# Patient Record
Sex: Male | Born: 1955 | Race: Black or African American | Hispanic: No | Marital: Single | Smoking: Current every day smoker
Health system: Southern US, Community
[De-identification: ages and names within clinical notes are randomized; demographics above are authoritative.]

## PROBLEM LIST (undated history)

## (undated) DIAGNOSIS — I10 Essential (primary) hypertension: Secondary | ICD-10-CM

## (undated) DIAGNOSIS — K279 Peptic ulcer, site unspecified, unspecified as acute or chronic, without hemorrhage or perforation: Secondary | ICD-10-CM

## (undated) DIAGNOSIS — E119 Type 2 diabetes mellitus without complications: Secondary | ICD-10-CM

## (undated) HISTORY — DX: Type 2 diabetes mellitus without complications: E11.9

## (undated) HISTORY — PX: THYROIDECTOMY: SHX17

---

## 2003-01-11 ENCOUNTER — Other Ambulatory Visit: Payer: Self-pay

## 2003-12-11 ENCOUNTER — Emergency Department: Payer: Self-pay | Admitting: Emergency Medicine

## 2004-06-03 ENCOUNTER — Ambulatory Visit: Payer: Self-pay | Admitting: Specialist

## 2004-07-18 ENCOUNTER — Ambulatory Visit: Payer: Self-pay | Admitting: Specialist

## 2007-07-11 ENCOUNTER — Emergency Department: Payer: Self-pay | Admitting: Internal Medicine

## 2009-01-25 ENCOUNTER — Emergency Department: Payer: Self-pay | Admitting: Emergency Medicine

## 2009-02-03 ENCOUNTER — Emergency Department: Payer: Self-pay | Admitting: Emergency Medicine

## 2009-02-06 ENCOUNTER — Emergency Department: Payer: Self-pay | Admitting: Emergency Medicine

## 2012-01-29 DIAGNOSIS — I10 Essential (primary) hypertension: Secondary | ICD-10-CM | POA: Insufficient documentation

## 2014-03-23 ENCOUNTER — Emergency Department: Payer: Self-pay | Admitting: Emergency Medicine

## 2014-05-20 ENCOUNTER — Emergency Department: Payer: Self-pay | Admitting: Emergency Medicine

## 2014-11-29 ENCOUNTER — Emergency Department: Payer: Self-pay

## 2014-11-29 ENCOUNTER — Emergency Department
Admission: EM | Admit: 2014-11-29 | Discharge: 2014-11-29 | Disposition: A | Discharge status: Home / Self Care | Payer: Self-pay | Attending: Emergency Medicine | Admitting: Emergency Medicine

## 2014-11-29 DIAGNOSIS — R000 Tachycardia, unspecified: Secondary | ICD-10-CM | POA: Insufficient documentation

## 2014-11-29 DIAGNOSIS — Z79899 Other long term (current) drug therapy: Secondary | ICD-10-CM | POA: Insufficient documentation

## 2014-11-29 DIAGNOSIS — J029 Acute pharyngitis, unspecified: Secondary | ICD-10-CM | POA: Insufficient documentation

## 2014-11-29 DIAGNOSIS — Z720 Tobacco use: Secondary | ICD-10-CM | POA: Insufficient documentation

## 2014-11-29 LAB — MONONUCLEOSIS SCREEN: MONO SCREEN: NEGATIVE

## 2014-11-29 MED ORDER — IBUPROFEN 800 MG PO TABS
800.0000 mg | ORAL_TABLET | Freq: Once | ORAL | Status: AC
  Administered 2014-11-29: 800 mg via ORAL
  Filled 2014-11-29: qty 1

## 2014-11-29 MED ORDER — DEXAMETHASONE 1 MG/ML PO CONC
10.0000 mg | Freq: Once | ORAL | Status: AC
  Administered 2014-11-29: 10 mg via ORAL
  Filled 2014-11-29: qty 10

## 2014-11-29 MED ORDER — ACETAMINOPHEN 325 MG PO TABS
ORAL_TABLET | ORAL | Status: AC
  Filled 2014-11-29: qty 2

## 2014-11-29 MED ORDER — LIDOCAINE VISCOUS 2 % MT SOLN
15.0000 mL | Freq: Once | OROMUCOSAL | Status: AC
  Administered 2014-11-29: 15 mL via OROMUCOSAL
  Filled 2014-11-29: qty 15

## 2014-11-29 MED ORDER — ACETAMINOPHEN 325 MG PO TABS
650.0000 mg | ORAL_TABLET | Freq: Once | ORAL | Status: AC
  Administered 2014-11-29: 650 mg via ORAL

## 2014-11-29 NOTE — ED Provider Notes (Signed)
[facility] Emergency Department Provider Note  ____________________________________________  Time seen: Approximately 607 AM  I have reviewed the triage vital signs and the nursing notes.   HISTORY  Chief Complaint Sore Throat    HPI Mitchell Robbins is a 59 y.o. male who comes in with sore throat. The patient reports that he's had a sore throat since Sunday and he has been mainly gargling and drinking hot things and using salty water. The patient reports that it hurts whenever he swallows so he decided to come in for evaluation today. The patient did not know that he had a fever but was told he had a fever here in the hospital the patient has had a cough with no shortness of breath. He reports he's also had some phlegm. He denies any sick contacts or any abdominal pain.The patient rates his pain as a 8/10 in intensity. He reports that his neck feels swollen on the right side.    No past medical history   There are no active problems to display for this patient.   No past surgical history  Current Outpatient Rx  Name  Route  Sig  Dispense  Refill  . hydrochlorothiazide (HYDRODIURIL) 50 MG tablet   Oral   Take 50 mg by mouth daily.         Marland Kitchen lisinopril (PRINIVIL,ZESTRIL) 20 MG tablet   Oral   Take 20 mg by mouth daily.           Allergies Review of patient's allergies indicates no known allergies.  No family history on file.  Social History Social History  Substance Use Topics  . Smoking status: Smoker  . Smokeless tobacco: Not on file  . Alcohol Use: No alcohol    Review of Systems Constitutional: fever/chills Eyes: No visual changes. ENT:  sore throat. Cardiovascular: Denies chest pain. Respiratory: Denies shortness of breath. Gastrointestinal: No abdominal pain.  No nausea, no vomiting.  No diarrhea.  No constipation. Genitourinary: Negative for dysuria. Musculoskeletal: Negative for back pain. Skin: Negative for  rash. Neurological: Negative for headaches, focal weakness or numbness.  10-point ROS otherwise negative.  ____________________________________________   PHYSICAL EXAM:  VITAL SIGNS: ED Triage Vitals  Enc Vitals Group     BP 11/29/14 0128 171/103 mmHg     Pulse Rate 11/29/14 0128 118     Resp 11/29/14 0128 18     Temp 11/29/14 0128 101.2 F (38.4 C)     Temp src --      SpO2 11/29/14 0128 93 %     Weight 11/29/14 0128 250 lb (113.399 kg)     Height 11/29/14 0128 5\' 11"  (1.803 m)     Head Cir --      Peak Flow --      Pain Score 11/29/14 0129 8     Pain Loc --      Pain Edu? --      Excl. in [place]? --     Constitutional: Alert and oriented. Well appearing and in mild distress. Eyes: Conjunctivae are normal. PERRL. EOMI. Head: Atraumatic. Nose: No congestion/rhinnorhea. Mouth/Throat: Mucous membranes are moist.  Mild erythema in posterior oropharynx, no cervical lymphadenopathy Cardiovascular: Tachycardia regular rhythm. Grossly normal heart sounds.  Good peripheral circulation. Respiratory: Normal respiratory effort.  No retractions. Lungs CTAB. Gastrointestinal: Soft and nontender. No distention.  Musculoskeletal: No lower extremity tenderness nor edema.   Neurologic:  Normal speech and language.  Skin:  Skin is warm, dry and intact. Psychiatric: Mood and  affect are normal.  ____________________________________________   LABS (all labs ordered are listed, but only abnormal results are displayed)  Labs Reviewed  CULTURE, GROUP A STREP ([facility] ONLY)  MONONUCLEOSIS SCREEN   ____________________________________________  EKG  none ____________________________________________  RADIOLOGY  CXR: No evidence of infiltrate to suggest pneumonia ____________________________________________   PROCEDURES  Procedure(s) performed: None  Critical Care performed: No  ____________________________________________   INITIAL IMPRESSION / ASSESSMENT AND PLAN / ED  COURSE  Pertinent labs & imaging results that were available during my care of the patient were reviewed by me and considered in my medical decision making (see chart for details).  This is a 59 year old male who comes in today with some sore throat. He reports that he's had this pain and it hurts when he swallows. I will give the patient some ibuprofen, Decadron, and viscous lidocaine. I will also check a Monospot to evaluate for other current causes of sore throat. The patient's strep test was negative. I will have the patient orally hydrate as he does have a fever and some tachycardia. I will also do a chest x-ray to evaluate for possible pneumonia.  The patient reports that he had some mild improvement in his pain but did not get any better. While we are waiting for the results of the mononucleosis screen the patient reports that he had to go and he did not want to stay anymore. I informed the patient that I would allow him to go but he does need to follow back up with the acute care walk-in clinic. I informed the patient that since he does not have strep I will not give him a prescription for antibiotics at this time but if his culture should grow out positive then we will call in an antibiotic. Otherwise the patient will be discharged home without having the results of his mononucleosis screen. ____________________________________________   FINAL CLINICAL IMPRESSION(S) / ED DIAGNOSES  Final diagnoses:  Pharyngitis      Loney Hering, MD 11/29/14 [phone]

## 2014-11-29 NOTE — Discharge Instructions (Signed)

## 2014-11-29 NOTE — ED Notes (Signed)
Strep test neg.

## 2014-11-29 NOTE — ED Notes (Signed)
Dr. Dahlia Client notified of continues pain and updated vital signs.  Patient has verbalized that he needs to leave due to work schedule.  Dr. Dahlia Client verbalized that she would be in to see patient.

## 2014-11-29 NOTE — ED Notes (Signed)
Sore throat since Sunday, able to swallow food with pain.

## 2014-12-01 LAB — CULTURE, GROUP A STREP (THRC)

## 2015-09-30 ENCOUNTER — Emergency Department
Admission: EM | Admit: 2015-09-30 | Discharge: 2015-09-30 | Disposition: A | Discharge status: Home / Self Care | Payer: Self-pay | Attending: Emergency Medicine | Admitting: Emergency Medicine

## 2015-09-30 ENCOUNTER — Emergency Department: Payer: Self-pay

## 2015-09-30 DIAGNOSIS — Z79899 Other long term (current) drug therapy: Secondary | ICD-10-CM | POA: Insufficient documentation

## 2015-09-30 DIAGNOSIS — M7989 Other specified soft tissue disorders: Secondary | ICD-10-CM

## 2015-09-30 DIAGNOSIS — M79602 Pain in left arm: Secondary | ICD-10-CM

## 2015-09-30 DIAGNOSIS — L03114 Cellulitis of left upper limb: Secondary | ICD-10-CM | POA: Insufficient documentation

## 2015-09-30 DIAGNOSIS — I10 Essential (primary) hypertension: Secondary | ICD-10-CM | POA: Insufficient documentation

## 2015-09-30 DIAGNOSIS — F17200 Nicotine dependence, unspecified, uncomplicated: Secondary | ICD-10-CM | POA: Insufficient documentation

## 2015-09-30 LAB — BASIC METABOLIC PANEL
Anion gap: 8 (ref 5–15)
BUN: 12 mg/dL (ref 6–20)
CHLORIDE: 101 mmol/L (ref 101–111)
CO2: 28 mmol/L (ref 22–32)
CREATININE: 1.13 mg/dL (ref 0.61–1.24)
Calcium: 9.1 mg/dL (ref 8.9–10.3)
GFR calc non Af Amer: >60 mL/min (ref >60)
Glucose, Bld: 117 mg/dL — ABNORMAL HIGH (ref 65–99)
POTASSIUM: 3.3 mmol/L — AB (ref 3.5–5.1)
Sodium: 137 mmol/L (ref 135–145)

## 2015-09-30 LAB — CBC WITH DIFFERENTIAL/PLATELET
BASOS ABS: 0.1 10*3/uL (ref 0–0.1)
BASOS PCT: 1 %
Eosinophils Absolute: 0.1 10*3/uL (ref 0–0.7)
Eosinophils Relative: 1 %
HEMATOCRIT: 45.5 % (ref 40.0–52.0)
HEMOGLOBIN: 15.4 g/dL (ref 13.0–18.0)
LYMPHS PCT: 26 %
Lymphs Abs: 3.8 10*3/uL — ABNORMAL HIGH (ref 1.0–3.6)
MCH: 28.8 pg (ref 26.0–34.0)
MCHC: 33.9 g/dL (ref 32.0–36.0)
MCV: 85.1 fL (ref 80.0–100.0)
MONOS PCT: 7 %
Monocytes Absolute: 1 10*3/uL (ref 0.2–1.0)
NEUTROS ABS: 9.8 10*3/uL — AB (ref 1.4–6.5)
Neutrophils Relative %: 65 %
Platelets: 251 10*3/uL (ref 150–440)
RBC: 5.35 MIL/uL (ref 4.40–5.90)
RDW: 15 % — ABNORMAL HIGH (ref 11.5–14.5)
WBC: 14.8 10*3/uL — ABNORMAL HIGH (ref 3.8–10.6)

## 2015-09-30 LAB — URIC ACID: Uric Acid, Serum: 9.3 mg/dL — ABNORMAL HIGH (ref 4.4–7.6)

## 2015-09-30 MED ORDER — IPRATROPIUM-ALBUTEROL 0.5-2.5 (3) MG/3ML IN SOLN
3.0000 mL | Freq: Once | RESPIRATORY_TRACT | Status: AC
  Administered 2015-09-30: 3 mL via RESPIRATORY_TRACT
  Filled 2015-09-30: qty 3

## 2015-09-30 MED ORDER — CLINDAMYCIN HCL 300 MG PO CAPS: 300.0000 mg | ORAL_CAPSULE | Freq: Three times a day (TID) | ORAL | 0 refills | Status: AC

## 2015-09-30 MED ORDER — IBUPROFEN 600 MG PO TABS
600.0000 mg | ORAL_TABLET | Freq: Once | ORAL | Status: AC
  Administered 2015-09-30: 600 mg via ORAL
  Filled 2015-09-30: qty 1

## 2015-09-30 MED ORDER — CLINDAMYCIN HCL 150 MG PO CAPS
300.0000 mg | ORAL_CAPSULE | Freq: Once | ORAL | Status: AC
  Administered 2015-09-30: 300 mg via ORAL
  Filled 2015-09-30: qty 2

## 2015-09-30 MED ORDER — HYDROCODONE-ACETAMINOPHEN 5-325 MG PO TABS: 1.0000 | ORAL_TABLET | Freq: Four times a day (QID) | ORAL | 0 refills | Status: DC | PRN

## 2015-09-30 NOTE — ED Notes (Signed)
Pt asleep, pox 82% on ra, snoring noted. Pt placed on 2lpm via [place] oxygen with rebound pox of 94%.

## 2015-09-30 NOTE — ED Notes (Signed)
Pt complains of pain and swelling to left forearm, hand, wrist and fingers for two days. Arm is approx 2+ edema worth of swelling, slightly hot to touch. Cms intact to fingers. Pt states pain increases with movement of wrist. Pt denies known fever or chills. resps unlabored. Pt denies shob, chest pain, nausea, dizziness. Slight redness noted to posterior hand above web between index finger and thumb.

## 2015-09-30 NOTE — ED Notes (Signed)
Pt updated on ultrasound progress. Pt verbalizes understanding.

## 2015-09-30 NOTE — ED Notes (Signed)
Swelling to left wrist and hand.  Severe pain noted to left wrist.

## 2015-09-30 NOTE — ED Provider Notes (Signed)
[facility] Emergency Department Provider Note   ____________________________________________  Time seen: Approximately [id number] AM  I have reviewed the triage vital signs and the nursing notes.   HISTORY  Chief Complaint Left arm pain and swelling    HPI Mitchell Robbins is a 60 y.o. male who comes into the hospital today with some left arm swelling. The patient reports that the swelling started on Thursday but seemed to get worse on Friday. 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 He reports that he tried a heating pad and some stuff to rub on it but it didn't do no good. The patient denies any chest pain or shortness of breath denies any dizziness or lightheadedness. He reports that he did have this happen once before but it was a while back. The patient reports that it just went away on its own and he doesn't know exactly why it occurred. The patient has no fever no nausea or no vomiting. He did receive some pain medicine for the arm pain but still rates it a 7 out of 10 in intensity. The patient is here for evaluation of this arm pain and swelling.   Past medical history Hypertension  There are no active problems to display for this patient.   Past surgical history Thyroid surgery  Current Outpatient Rx  . Order #: [id number] Class: Historical Med  . Order #: [id number] Class: Historical Med    Allergies Review of patient's allergies indicates no known allergies.  No family history on file.  Social History Social History  Substance Use Topics  . Smoking status: Current every day smoker   . Smokeless tobacco: Not on file  . Alcohol use None     Review of Systems Constitutional: No fever/chills Eyes: No visual changes. ENT: No sore throat. Cardiovascular: Denies chest pain. Respiratory: Denies shortness of breath. Gastrointestinal: No abdominal pain.  No nausea, no vomiting.  No diarrhea.  No constipation. Genitourinary: Negative  for dysuria. Musculoskeletal: Left arm pain and swelling Skin: Negative for rash. Neurological: Negative for headaches, focal weakness or numbness.  10-point ROS otherwise negative.  ____________________________________________   PHYSICAL EXAM:  VITAL SIGNS: ED Triage Vitals  Enc Vitals Group     BP 09/30/15 0530 (!) 165/93     Pulse Rate 09/30/15 0530 95     Resp --      Temp --      Temp src --      SpO2 09/30/15 0530 (!) 82 %     Weight --      Height --      Head Circumference --      Peak Flow --      Pain Score 09/30/15 0512 Asleep     Pain Loc --      Pain Edu? --      Excl. in [place]? --     Constitutional: Alert and oriented. Well appearing and in Moderate distress. Eyes: Conjunctivae are normal. PERRL. EOMI. Head: Atraumatic. Nose: No congestion/rhinnorhea. Mouth/Throat: Mucous membranes are moist.  Oropharynx non-erythematous. Cardiovascular: Normal rate, regular rhythm. Grossly normal heart sounds.  Good peripheral circulation. Respiratory: Normal respiratory effort.  No retractions. Expiratory wheezes in all lung fields mild. Gastrointestinal: Soft and nontender. No distention. Positive bowel sounds Musculoskeletal: Left arm swelling and tenderness to palpation   Neurologic:  Normal speech and language.  Skin:  Skin is warm, dry and intact.  Psychiatric: Mood and affect are normal.   ____________________________________________   LABS (  all labs ordered are listed, but only abnormal results are displayed)  Labs Reviewed - No data to display ____________________________________________  EKG  None ____________________________________________  RADIOLOGY  LUE Ultrasound pending ____________________________________________   PROCEDURES  Procedure(s) performed: None  Procedures  Critical Care performed: No  ____________________________________________   INITIAL IMPRESSION / ASSESSMENT AND PLAN / ED COURSE  Pertinent labs & imaging results  that were available during my care of the patient were reviewed by me and considered in my medical decision making (see chart for details).  This is a 27-year-old male who comes into the hospital today with some left arm swelling. 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 At this point we are awaiting the results of the patient's ultrasound. The patient has had 2 doses of Percocet. Given the patient's wheezing I will give him a dose of DuoNeb and then reassess the patient.  Patient's care was signed out to Dr. Reita Cliche will follow-up the results of the ultrasound. ____________________________________________   FINAL CLINICAL IMPRESSION(S) / ED DIAGNOSES  Final diagnoses:  Pain of left upper extremity  Arm swelling      NEW MEDICATIONS STARTED DURING THIS VISIT:  New Prescriptions   No medications on file     Note:  This document was prepared using Dragon voice recognition software and may include unintentional dictation errors.    Loney Hering, MD 09/30/15 [phone]

## 2015-09-30 NOTE — ED Notes (Signed)
Report received. Pt alert and oriented. Waiting for [place]. Respirations unlabored.

## 2015-09-30 NOTE — ED Notes (Signed)
Report to valerie, rn.  

## 2015-09-30 NOTE — ED Provider Notes (Addendum)
[facility]  I accepted care from Dr. Dahlia Client ____________________________________________    LABS (pertinent positives/negatives)  Labs Reviewed  CBC WITH DIFFERENTIAL/PLATELET - Abnormal; Notable for the following:       Result Value   WBC 14.8 (*)    RDW 15.0 (*)    Neutro Abs 9.8 (*)    Lymphs Abs 3.8 (*)    All other components within normal limits  BASIC METABOLIC PANEL - Abnormal; Notable for the following:    Potassium 3.3 (*)    Glucose, Bld 117 (*)    All other components within normal limits  URIC ACID - Abnormal; Notable for the following:    Uric Acid, Serum 9.3 (*)    All other components within normal limits      ____________________________________________    RADIOLOGY All xrays were viewed by me. Imaging interpreted by radiologist.  Ultrasound left lower extremity: No DVT  ____________________________________________   PROCEDURES  Procedure(s) performed: None  Critical Care performed: None  ____________________________________________   INITIAL IMPRESSION / ASSESSMENT AND PLAN / ED COURSE   Pertinent labs & imaging results that were available during my care of the patient were reviewed by me and considered in my medical decision making (see chart for details).  This patient did wait an extended period of time overnight due to IT/epic changeover with downtime and prolonged to stay.  Ultimately was signed out to me that the symptoms were concerning for upper extremity DVT and to follow up the ultrasound.  The ultrasound showed no DVT. I discussed this with the patient and evaluated him myself. His left midforearm down to his hand and fingers are swollen and somewhat tense although compartment still soft. There is a bit of redness and I am a little concerned that this may actually be cellulitis. He tells me that symptoms started on Friday and have been getting worse, although since she's been in the ED he does not notice  that his arm swelling pain or redness is any worse.  I discussed with him that at this point I would recommend laboratory studies to check white blood cell count and uric acid and potentially treat for cellulitis.  Potentially if white blood cell count when to come back significantly elevated, given that this involves the hand I would recommend IV antibiotics and hospitalization.  Potentially if the uric acid was high, this could be presentation of gout.  Certainly when I feel his wrist there are no excellent landmarks for obtaining fluid. He is not reporting fever or systemic symptoms.  The patient is quite tired of waiting all night long and agrees to have blood work taken but does not want to stay for results and understands that at this point I am presumptively treating for cellulitis and that if the blood work was supportive of this I would have recommended IV antibiotics. In any case, he will be recommended to follow-up with a physician in 24 hours for recheck.  CONSULTATIONS: None    Patient / Family / Caregiver informed of clinical course, medical decision-making process, and agree with plan.   I discussed return precautions, follow-up instructions, and discharged instructions with patient and/or family.    Addendum at 2:30pm:  to include laboratory studies which resulted elevated uric acid, elevated white blood cell count of 14,000, and mildly low potassium at 3.3.  I had discussed with the patient whether or not he would be willing to be admitted to the hospital for IV antibiotics and he said  no and wanted to try the clindamycin at home. He was agreeable to have 24-hour follow-up and a skin marker marked the edge of the redness and swelling.  Ultimately at this point he may have some element of gout, but I think he does actually have the left upper x-rays cellulitis, but he did not appear septic, and he chose not to be admitted to the hospital. He will have 24 hour  follow-up.  ____________________________________________   FINAL CLINICAL IMPRESSION(S) / ED DIAGNOSES  Final diagnoses:  Pain of left upper extremity  Arm swelling  Cellulitis of hand, left        Lisa Roca, MD 09/30/15 [place], MD 09/30/15 1430

## 2015-09-30 NOTE — ED Notes (Signed)
Patient transported to Ultrasound 

## 2015-09-30 NOTE — ED Notes (Signed)
Explanation of ultrasound process and evaluation plan discussed with pt who verbalizes understanding.

## 2015-09-30 NOTE — Discharge Instructions (Signed)
You were evaluated for left hand and arm swelling, and although no certain cause was found, I am suspicious for possible skin infection called cellulitis.  As we discussed, I recommended that you stay for blood work and the possibility of IV antibiotics for potential skin infection, but you chose to leave before blood work resulted.  Your ultrasound showed no blood clot. I also discussed that gout might be a possibility. Because of the possibility of cellulitis/infection, I do want you to see a doctor in 24 hours to ensure there is no worsening.  Return to the emergency department for any worsening condition including any worsening swelling, redness, skin rash, or any system wide symptoms such as vomiting or dizziness or fever.

## 2015-10-01 ENCOUNTER — Encounter: Payer: Self-pay | Admitting: Emergency Medicine

## 2015-10-01 ENCOUNTER — Inpatient Hospital Stay
Admission: EM | Admit: 2015-10-01 | Discharge: 2015-10-02 | DRG: 603 | Disposition: A | Discharge status: Home / Self Care | Payer: Self-pay | Attending: Internal Medicine | Admitting: Internal Medicine

## 2015-10-01 DIAGNOSIS — Z79899 Other long term (current) drug therapy: Secondary | ICD-10-CM

## 2015-10-01 DIAGNOSIS — E876 Hypokalemia: Secondary | ICD-10-CM | POA: Diagnosis present

## 2015-10-01 DIAGNOSIS — L03114 Cellulitis of left upper limb: Principal | ICD-10-CM | POA: Diagnosis present

## 2015-10-01 DIAGNOSIS — E890 Postprocedural hypothyroidism: Secondary | ICD-10-CM | POA: Diagnosis present

## 2015-10-01 DIAGNOSIS — L0390 Cellulitis, unspecified: Secondary | ICD-10-CM | POA: Diagnosis present

## 2015-10-01 DIAGNOSIS — F17200 Nicotine dependence, unspecified, uncomplicated: Secondary | ICD-10-CM | POA: Diagnosis present

## 2015-10-01 DIAGNOSIS — I10 Essential (primary) hypertension: Secondary | ICD-10-CM | POA: Diagnosis present

## 2015-10-01 DIAGNOSIS — Z8249 Family history of ischemic heart disease and other diseases of the circulatory system: Secondary | ICD-10-CM

## 2015-10-01 HISTORY — DX: Essential (primary) hypertension: I10

## 2015-10-01 LAB — URIC ACID: URIC ACID, SERUM: 9 mg/dL — AB (ref 4.4–7.6)

## 2015-10-01 LAB — CBC
HCT: 44.5 % (ref 40.0–52.0)
Hemoglobin: 15.1 g/dL (ref 13.0–18.0)
MCH: 28.9 pg (ref 26.0–34.0)
MCHC: 34 g/dL (ref 32.0–36.0)
MCV: 84.9 fL (ref 80.0–100.0)
PLATELETS: 241 10*3/uL (ref 150–440)
RBC: 5.25 MIL/uL (ref 4.40–5.90)
RDW: 15.7 % — AB (ref 11.5–14.5)
WBC: 11.8 10*3/uL — AB (ref 3.8–10.6)

## 2015-10-01 LAB — COMPREHENSIVE METABOLIC PANEL
ALBUMIN: 4 g/dL (ref 3.5–5.0)
ALT: 27 U/L (ref 17–63)
AST: 29 U/L (ref 15–41)
Alkaline Phosphatase: 65 U/L (ref 38–126)
Anion gap: 8 (ref 5–15)
BUN: 13 mg/dL (ref 6–20)
CHLORIDE: 104 mmol/L (ref 101–111)
CO2: 26 mmol/L (ref 22–32)
Calcium: 9.3 mg/dL (ref 8.9–10.3)
Creatinine, Ser: 1.17 mg/dL (ref 0.61–1.24)
GFR calc Af Amer: >60 mL/min (ref >60)
GFR calc non Af Amer: >60 mL/min (ref >60)
GLUCOSE: 140 mg/dL — AB (ref 65–99)
POTASSIUM: 3.2 mmol/L — AB (ref 3.5–5.1)
SODIUM: 138 mmol/L (ref 135–145)
Total Bilirubin: 0.6 mg/dL (ref 0.3–1.2)
Total Protein: 7.7 g/dL (ref 6.5–8.1)

## 2015-10-01 MED ORDER — ONDANSETRON HCL 4 MG PO TABS: 4.0000 mg | ORAL_TABLET | Freq: Four times a day (QID) | ORAL | Status: DC | PRN

## 2015-10-01 MED ORDER — ENOXAPARIN SODIUM 40 MG/0.4ML ~~LOC~~ SOLN
40.0000 mg | SUBCUTANEOUS | Status: DC
  Administered 2015-10-01: 40 mg via SUBCUTANEOUS
  Filled 2015-10-01: qty 0.4

## 2015-10-01 MED ORDER — HYDROCODONE-ACETAMINOPHEN 5-325 MG PO TABS
1.0000 | ORAL_TABLET | ORAL | Status: DC | PRN
  Administered 2015-10-01: 1 via ORAL
  Administered 2015-10-02: 2 via ORAL
  Filled 2015-10-01: qty 2
  Filled 2015-10-01: qty 1

## 2015-10-01 MED ORDER — MORPHINE SULFATE (PF) 2 MG/ML IV SOLN
2.0000 mg | INTRAVENOUS | Status: DC | PRN
Start: 2015-10-01 — End: 2015-10-02

## 2015-10-01 MED ORDER — VANCOMYCIN HCL IN DEXTROSE 1-5 GM/200ML-% IV SOLN
1000.0000 mg | Freq: Once | INTRAVENOUS | Status: AC
  Administered 2015-10-01: 1000 mg via INTRAVENOUS
  Filled 2015-10-01: qty 200

## 2015-10-01 MED ORDER — SENNOSIDES-DOCUSATE SODIUM 8.6-50 MG PO TABS: 1.0000 | ORAL_TABLET | Freq: Every evening | ORAL | Status: DC | PRN

## 2015-10-01 MED ORDER — SODIUM CHLORIDE 0.9% FLUSH: 3.0000 mL | INTRAVENOUS | Status: DC | PRN

## 2015-10-01 MED ORDER — LISINOPRIL 20 MG PO TABS
20.0000 mg | ORAL_TABLET | Freq: Every day | ORAL | Status: DC
  Administered 2015-10-02: 20 mg via ORAL
  Filled 2015-10-01: qty 1

## 2015-10-01 MED ORDER — SODIUM CHLORIDE 0.9% FLUSH
3.0000 mL | Freq: Two times a day (BID) | INTRAVENOUS | Status: DC
  Administered 2015-10-01: 3 mL via INTRAVENOUS

## 2015-10-01 MED ORDER — ONDANSETRON HCL 4 MG/2ML IJ SOLN: 4.0000 mg | Freq: Four times a day (QID) | INTRAMUSCULAR | Status: DC | PRN

## 2015-10-01 MED ORDER — VANCOMYCIN HCL IN DEXTROSE 1-5 GM/200ML-% IV SOLN
1000.0000 mg | Freq: Two times a day (BID) | INTRAVENOUS | Status: DC
  Administered 2015-10-01 – 2015-10-02 (×2): 1000 mg via INTRAVENOUS
  Filled 2015-10-01 (×4): qty 200

## 2015-10-01 MED ORDER — PIPERACILLIN-TAZOBACTAM 3.375 G IVPB
3.3750 g | Freq: Three times a day (TID) | INTRAVENOUS | Status: DC
  Administered 2015-10-01 – 2015-10-02 (×3): 3.375 g via INTRAVENOUS
  Filled 2015-10-01 (×6): qty 50

## 2015-10-01 MED ORDER — VANCOMYCIN HCL IN DEXTROSE 1-5 GM/200ML-% IV SOLN: 1000.0000 mg | Freq: Once | INTRAVENOUS | Status: DC

## 2015-10-01 MED ORDER — POTASSIUM CHLORIDE CRYS ER 20 MEQ PO TBCR
20.0000 meq | EXTENDED_RELEASE_TABLET | Freq: Two times a day (BID) | ORAL | Status: DC
  Administered 2015-10-01 – 2015-10-02 (×3): 20 meq via ORAL
  Filled 2015-10-01 (×3): qty 1

## 2015-10-01 MED ORDER — ACETAMINOPHEN 325 MG PO TABS
650.0000 mg | ORAL_TABLET | Freq: Four times a day (QID) | ORAL | Status: DC | PRN
Start: 2015-10-01 — End: 2015-10-02

## 2015-10-01 MED ORDER — ACETAMINOPHEN 650 MG RE SUPP: 650.0000 mg | Freq: Four times a day (QID) | RECTAL | Status: DC | PRN

## 2015-10-01 MED ORDER — SODIUM CHLORIDE 0.9 % IV SOLN
250.0000 mL | INTRAVENOUS | Status: DC | PRN
Start: 2015-10-01 — End: 2015-10-02

## 2015-10-01 MED ORDER — PIPERACILLIN-TAZOBACTAM 3.375 G IVPB 30 MIN
3.3750 g | Freq: Once | INTRAVENOUS | Status: AC
  Administered 2015-10-01: 3.375 g via INTRAVENOUS

## 2015-10-01 MED ORDER — POTASSIUM CHLORIDE CRYS ER 20 MEQ PO TBCR
40.0000 meq | EXTENDED_RELEASE_TABLET | Freq: Once | ORAL | Status: AC
  Administered 2015-10-01: 40 meq via ORAL
  Filled 2015-10-01: qty 2

## 2015-10-01 NOTE — Progress Notes (Signed)
Pharmacy Antibiotic Note  Mitchell Robbins is a 60 y.o. male admitted on 10/01/2015 with cellulitis.  Pharmacy has been consulted for Zosyn and vancomycin dosing.  Plan: 1. Zosyn 3.375 gm IV Q12H EI 2. Vancomycin 1 gm IV x 1 followed in 6 hours (stacked dosing) by vancomycin 1 gm IV Q12H, predicted trough 11 mcg/mL. Pharmacy will continue to follow and adjust as needed to maintain trough 10 to 15 mcg/mL.   Vd 63.4 L, Ke 0.076 hr-1, T1/2 9.1 hr  Height: 5\' 11"  (180.3 cm) Weight: 250 lb (113.4 kg) IBW/kg (Calculated) : 75.3  Temp (24hrs), Avg:98.3 F (36.8 C), Min:98.3 F (36.8 C), Max:98.3 F (36.8 C)   Recent Labs Lab 09/30/15 1058 10/01/15 0607  WBC 14.8* 11.8*  CREATININE 1.13 1.17    Estimated Creatinine Clearance: 85.9 mL/min (by C-G formula based on SCr of 1.17 mg/dL).    No Known Allergies  Thank you for allowing pharmacy to be a part of this patient's care.  Laural Benes, Pharm.D., BCPS Clinical Pharmacist 10/01/2015 7:27 AM

## 2015-10-01 NOTE — ED Provider Notes (Signed)
[facility] Emergency Department Provider Note  Time seen: 6:28 AM  I have reviewed the triage vital signs and the nursing notes.   HISTORY  Chief Complaint Hand Pain    HPI Mitchell Robbins is a 60 y.o. male with a past medical history of hypertension presents the emergency department with left arm pain and swelling. According to the patient for the past 4 days he has been experiencing mild swelling an to his left hand wrist States over the past 2 days it is significantly increased. Seen in the emergency department yesterday for the pain and swelling. At that time the patient had an ultrasound which was negative for DVT blood work showing a leukocytosis of 14,000. Patient was discharged on antibiotics and told to return to the emergency department if the swelling worsens. Patient states the swelling and pain have worsened over night so he came to the emergency department this morning for evaluation. Denies fever denies vomiting. Describes moderate pain in his left hand and wrist and forearm worse with touching the area or movement of the arm.     History reviewed. No pertinent past medical history.  There are no active problems to display for this patient.   Past Surgical History:  Procedure Laterality Date  . THYROIDECTOMY      Current Outpatient Rx  . Order #: [id number] Class: Print  . Order #: [id number] Class: Historical Med  . Order #: [id number] Class: Print  . Order #: [id number] Class: Historical Med    Allergies Review of patient's allergies indicates no known allergies.  No family history on file.  Social History Social History  Substance Use Topics  . Smoking status: Current Every Day Smoker  . Smokeless tobacco: Not on file  . Alcohol use No    Review of Systems Constitutional: Negative for fever. Cardiovascular: Negative for chest pain. Respiratory: Negative for shortness of breath. Gastrointestinal: Negative for abdominal  pain Genitourinary: Negative for dysuria. Musculoskeletal: Left arm pain. Skin: Negative for rash. Neurological: Negative for headaches, focal weakness or numbness.  10-point ROS otherwise negative.  ____________________________________________   PHYSICAL EXAM:  VITAL SIGNS: ED Triage Vitals [10/01/15 0535]  Enc Vitals Group     BP (!) 156/88     Pulse Rate 96     Resp 18     Temp 98.3 F (36.8 C)     Temp Source Oral     SpO2 98 %     Weight 250 lb (113.4 kg)     Height 5\' 11"  (1.803 m)     Head Circumference      Peak Flow      Pain Score 8     Pain Loc      Pain Edu?      Excl. in [place]?     Constitutional: Alert and oriented. Well appearing and in no distress. Eyes: Normal exam ENT   Head: Normocephalic and atraumatic   Mouth/Throat: Mucous membranes are moist. Cardiovascular: Normal rate, regular rhythm. No murmur Respiratory: Normal respiratory effort without tachypnea nor retractions. Breath sounds are clear Gastrointestinal: Soft and nontender.  Musculoskeletal: Moderate swelling of the left hand and wrist and distal forearm, with mild erythema, significant tenderness to palpation of this area. Appears to be neurovascularly intact distally with intact sensation and good capillary refill. At this time it does not appear consistent with a deep hand infection, but does appear consistent with cellulitis. Neurologic:  Normal speech and language. No gross focal neurologic deficits Skin:  Skin is warm, dry  and intact.  Psychiatric: Mood and affect are normal.   ____________________________________________    INITIAL IMPRESSION / ASSESSMENT AND PLAN / ED COURSE  Pertinent labs & imaging results that were available during my care of the patient were reviewed by me and considered in my medical decision making (see chart for details).  Patient presents the emergency department for repeat evaluation of left arm swelling, redness and pain. Patient's labs show a  decrease in his white blood cell count. Patient has been taking his prescribed antibiotics, but state the swelling and pain have worsened. Patient had not semper from yesterday showing no DVT. We will start the patient on IV antibiotics, check blood cultures, and admit to the hospital for further treatment.  ____________________________________________   FINAL CLINICAL IMPRESSION(S) / ED DIAGNOSES  Left arm cellulitis    Harvest Dark, MD 10/01/15 [phone]

## 2015-10-01 NOTE — H&P (Signed)
[facility] [place] NAME: Mitchell Robbins    MR#:  [id number]  DATE OF BIRTH:  1955/03/26  DATE OF ADMISSION:  10/01/2015  PRIMARY CARE PHYSICIAN: No PCP Per Patient   REQUESTING/REFERRING PHYSICIAN:   CHIEF COMPLAINT:   Chief Complaint  Patient presents with  . Hand Pain    HISTORY OF PRESENT ILLNESS: Mitchell Robbins  is a 60 y.o. male with a known history of Hypertension presented to the emergency room with pain and swelling in the left hand and left forearm. This has been going on since Friday and patient came to the emergency room on Saturday but refused to stay. The pain is aching in nature 8 out of 10 on a scale of 1-10. The swelling has gradually increased for the last 2 days and that is swelling of the hand as well as the left forearm. Tenderness is also present along the left wrist. No history of any fever or chills. Patient was discharged during the prior visit to the emergency room on oral clindamycin and patient has been taking this medication but says it the cellulitis has worsened. No complaints of any chest pain. No complaints of shortness of breath. No complaints of headache dizziness blurry vision. No history of syncope or seizure. No history of any IV drug abuse. Patient was worked up with a Doppler ultrasound of left upper extremity which showed no DVT. Hospitalist service was consulted for further care of the patient.  PAST MEDICAL HISTORY:   Past Medical History:  Diagnosis Date  . Hypertension     PAST SURGICAL HISTORY: Past Surgical History:  Procedure Laterality Date  . THYROIDECTOMY      SOCIAL HISTORY:  Social History  Substance Use Topics  . Smoking status: Current Every Day Smoker  . Smokeless tobacco: Never Used  . Alcohol use No     Comment: quit drinking alcohol long time ago    FAMILY HISTORY:  Family History  Problem Relation Age of Onset  . Hypertension Mother     DRUG ALLERGIES: No Known  Allergies  REVIEW OF SYSTEMS:   CONSTITUTIONAL: No fever, fatigue or weakness.  EYES: No blurred or double vision.  EARS, NOSE, AND THROAT: No tinnitus or ear pain.  RESPIRATORY: No cough, shortness of breath, wheezing or hemoptysis.  CARDIOVASCULAR: No chest pain, orthopnea, edema.  GASTROINTESTINAL: No nausea, vomiting, diarrhea or abdominal pain.  GENITOURINARY: No dysuria, hematuria.  ENDOCRINE: No polyuria, nocturia,  HEMATOLOGY: No anemia, easy bruising or bleeding SKIN: swelling of left hand and left fore arm. Redness of skin noted. MUSCULOSKELETAL: swelling left hand, fore arm and tenderness also noted.  NEUROLOGIC: No tingling, numbness, weakness.  PSYCHIATRY: No anxiety or depression.   MEDICATIONS AT HOME:  Prior to Admission medications   Medication Sig Start Date End Date Taking? Authorizing Provider  clindamycin (CLEOCIN) 300 MG capsule Take 1 capsule (300 mg total) by mouth 3 (three) times daily. 09/30/15 10/10/15  Lisa Roca, MD  hydrochlorothiazide (HYDRODIURIL) 50 MG tablet Take 50 mg by mouth daily.    Historical Provider, MD  HYDROcodone-acetaminophen (NORCO/VICODIN) 5-325 MG tablet Take 1 tablet by mouth every 6 (six) hours as needed for moderate pain. 09/30/15   Lisa Roca, MD  lisinopril (PRINIVIL,ZESTRIL) 20 MG tablet Take 20 mg by mouth daily.    Historical Provider, MD      PHYSICAL EXAMINATION:   VITAL SIGNS: Blood pressure (!) 156/88, pulse 96, temperature 98.3 F (36.8 C), temperature source  Oral, resp. rate 18, height 5\' 11"  (1.803 m), weight 113.4 kg (250 lb), SpO2 98 %.  GENERAL:  60 y.o.-year-old patient lying in the bed with no acute distress.  EYES: Pupils equal, round, reactive to light and accommodation. No scleral icterus. Extraocular muscles intact.  HEENT: Head atraumatic, normocephalic. Oropharynx and nasopharynx clear.  NECK:  Supple, no jugular venous distention. No thyroid enlargement, no tenderness.  LUNGS: Normal breath sounds  bilaterally, no wheezing, rales,rhonchi or crepitation. No use of accessory muscles of respiration.  CARDIOVASCULAR: S1, S2 normal. No murmurs, rubs, or gallops.  ABDOMEN: Soft, nontender, nondistended. Bowel sounds present. No organomegaly or mass.  EXTREMITIES: No pedal edema, cyanosis, or clubbing. Swelling and tenderness left hand,left fore arm and left wrist. Pulses present. NEUROLOGIC: Cranial nerves II through XII are intact. Muscle strength 5/5 in all extremities. Sensation intact. Gait not checked.  PSYCHIATRIC: The patient is alert and oriented x 3.  SKIN: No obvious rash, lesion, or ulcer.   LABORATORY PANEL:   CBC  Recent Labs Lab 09/30/15 1058 10/01/15 0607  WBC 14.8* 11.8*  HGB 15.4 15.1  HCT 45.5 44.5  PLT 251 241  MCV 85.1 84.9  MCH 28.8 28.9  MCHC 33.9 34.0  RDW 15.0* 15.7*  LYMPHSABS 3.8*  --   MONOABS 1.0  --   EOSABS 0.1  --   BASOSABS 0.1  --    ------------------------------------------------------------------------------------------------------------------  Chemistries   Recent Labs Lab 09/30/15 1058 10/01/15 0607  NA 137 138  K 3.3* 3.2*  CL 101 104  CO2 28 26  GLUCOSE 117* 140*  BUN 12 13  CREATININE 1.13 1.17  CALCIUM 9.1 9.3  AST  --  29  ALT  --  27  ALKPHOS  --  65  BILITOT  --  0.6   ------------------------------------------------------------------------------------------------------------------ estimated creatinine clearance is 85.9 mL/min (by C-G formula based on SCr of 1.17 mg/dL). ------------------------------------------------------------------------------------------------------------------ No results for input(s): TSH, T4TOTAL, T3FREE, THYROIDAB in the last 72 hours.  Invalid input(s): FREET3   Coagulation profile No results for input(s): INR, PROTIME in the last 168 hours. ------------------------------------------------------------------------------------------------------------------- No results for input(s):  DDIMER in the last 72 hours. -------------------------------------------------------------------------------------------------------------------  Cardiac Enzymes No results for input(s): CKMB, TROPONINI, MYOGLOBIN in the last 168 hours.  Invalid input(s): CK ------------------------------------------------------------------------------------------------------------------ Invalid input(s): POCBNP  ---------------------------------------------------------------------------------------------------------------  Urinalysis No results found for: COLORURINE, APPEARANCEUR, LABSPEC, [place], GLUCOSEU, [place], BILIRUBINUR, KETONESUR, PROTEINUR, UROBILINOGEN, NITRITE, LEUKOCYTESUR   RADIOLOGY: US Venous Img Upper Uni Left  Result Date: 09/30/2015 CLINICAL DATA:  Pain and swelling for 2 days EXAM: LEFT UPPER EXTREMITY VENOUS DOPPLER ULTRASOUND TECHNIQUE: Gray-scale sonography with graded compression, as well as color Doppler and duplex ultrasound were performed to evaluate the upper extremity deep venous system from the level of the subclavian vein and including the jugular, axillary, basilic, radial, ulnar and upper cephalic vein. Spectral Doppler was utilized to evaluate flow at rest and with distal augmentation maneuvers. COMPARISON:  None. FINDINGS: Contralateral Subclavian Vein: Respiratory phasicity is normal and symmetric with the symptomatic side. No evidence of thrombus. Normal compressibility. Internal Jugular Vein: No evidence of thrombus. Normal compressibility, respiratory phasicity and response to augmentation. Subclavian Vein: No evidence of thrombus. Normal compressibility, respiratory phasicity and response to augmentation. Axillary Vein: No evidence of thrombus. Normal compressibility, respiratory phasicity and response to augmentation. Cephalic Vein: No evidence of thrombus. Normal compressibility, respiratory phasicity and response to augmentation. Basilic Vein: No evidence of thrombus.  Normal compressibility, respiratory phasicity and response to augmentation. Brachial Veins: No evidence  of thrombus. Normal compressibility, respiratory phasicity and response to augmentation. Radial Veins: No evidence of thrombus. Normal compressibility, respiratory phasicity and response to augmentation. Ulnar Veins: No evidence of thrombus. Normal compressibility, respiratory phasicity and response to augmentation. Venous Reflux:  None visualized. Other Findings:  None visualized. IMPRESSION: No evidence of deep venous thrombosis. Electronically Signed   By: Dorise Bullion III M.D   On: 09/30/2015 09:22   EKG: Orders placed or performed in visit on 01/11/03  . EKG 12-Lead    IMPRESSION AND PLAN: 60 year old male patient with history of hypertension presented to the emergency room with left forearm and left hand swelling and redness and tenderness. Admitting diagnosis 1. Left forearm and left hand cellulitis 2. Hypokalemia 3. Leukocytosis 4. Hypertension Treatment plan Admit patient to medical floor Start patient on IV vancomycin and IV Zosyn antibiotic Pain management Replace potassium orally Follow-up WBC count Supportive care.  All the records are reviewed and case discussed with ED provider. Management plans discussed with the patient, family and they are in agreement.  CODE STATUS:FULL CODE Code Status History    This patient does not have a recorded code status. Please follow your organizational policy for patients in this situation.       TOTAL TIME TAKING CARE OF THIS PATIENT: 50 minutes.    Saundra Shelling M.D on 10/01/2015 at 7:05 AM  Between 7am to 6pm - Pager - [phone]  After 6pm go to [email or web address] - password [email or web address] [facility]  [facility]  Office  [phone]  CC: Primary care physician; No PCP Per Patient

## 2015-10-01 NOTE — ED Notes (Signed)
Pt resting in bed, resp even and unlabored, pt given call ball, pt awake and alert in no acute distress

## 2015-10-01 NOTE — Progress Notes (Signed)
[facility] at [place] NAME: Mitchell Robbins    MR#:  [id number]  DATE OF BIRTH:  05-13-1955  SUBJECTIVE:  CHIEF COMPLAINT:   Chief Complaint  Patient presents with  . Hand Pain     Came with swelling and pain in arm and hand.  REVIEW OF SYSTEMS:   CONSTITUTIONAL: No fever, fatigue or weakness.  EYES: No blurred or double vision.  EARS, NOSE, AND THROAT: No tinnitus or ear pain.  RESPIRATORY: No cough, shortness of breath, wheezing or hemoptysis.  CARDIOVASCULAR: No chest pain, orthopnea, edema.  GASTROINTESTINAL: No nausea, vomiting, diarrhea or abdominal pain.  GENITOURINARY: No dysuria, hematuria.  ENDOCRINE: No polyuria, nocturia,  HEMATOLOGY: No anemia, easy bruising or bleeding SKIN: swelling of left hand and left fore arm. Redness of skin noted. MUSCULOSKELETAL: swelling left hand, fore arm and tenderness also noted.  NEUROLOGIC: No tingling, numbness, weakness.  PSYCHIATRY: No anxiety or depression.   ROS  DRUG ALLERGIES:  No Known Allergies  VITALS:  Blood pressure 136/77, pulse 86, temperature 98.6 F (37 C), temperature source Oral, resp. rate 18, height 5\' 11"  (1.803 m), weight 113.4 kg (250 lb), SpO2 99 %.  PHYSICAL EXAMINATION:   GENERAL:  60 y.o.-year-old patient lying in the bed with no acute distress.  EYES: Pupils equal, round, reactive to light and accommodation. No scleral icterus. Extraocular muscles intact.  HEENT: Head atraumatic, normocephalic. Oropharynx and nasopharynx clear.  NECK:  Supple, no jugular venous distention. No thyroid enlargement, no tenderness.  LUNGS: Normal breath sounds bilaterally, no wheezing, rales,rhonchi or crepitation. No use of accessory muscles of respiration.  CARDIOVASCULAR: S1, S2 normal. No murmurs, rubs, or gallops.  ABDOMEN: Soft, nontender, nondistended. Bowel sounds present. No organomegaly or mass.  EXTREMITIES: No pedal edema, cyanosis, or clubbing. Swelling and  tenderness left hand,left fore arm and left wrist. Pulses present. NEUROLOGIC: Cranial nerves II through XII are intact. Muscle strength 5/5 in all extremities. Sensation intact. Gait not checked.  PSYCHIATRIC: The patient is alert and oriented x 3.  SKIN: No obvious rash, lesion, or ulcer.   Physical Exam LABORATORY PANEL:   CBC  Recent Labs Lab 10/01/15 0607  WBC 11.8*  HGB 15.1  HCT 44.5  PLT 241   ------------------------------------------------------------------------------------------------------------------  Chemistries   Recent Labs Lab 10/01/15 0607  NA 138  K 3.2*  CL 104  CO2 26  GLUCOSE 140*  BUN 13  CREATININE 1.17  CALCIUM 9.3  AST 29  ALT 27  ALKPHOS 65  BILITOT 0.6   ------------------------------------------------------------------------------------------------------------------  Cardiac Enzymes No results for input(s): TROPONINI in the last 168 hours. ------------------------------------------------------------------------------------------------------------------  RADIOLOGY:  US Venous Img Upper Uni Left  Result Date: 09/30/2015 CLINICAL DATA:  Pain and swelling for 2 days EXAM: LEFT UPPER EXTREMITY VENOUS DOPPLER ULTRASOUND TECHNIQUE: Gray-scale sonography with graded compression, as well as color Doppler and duplex ultrasound were performed to evaluate the upper extremity deep venous system from the level of the subclavian vein and including the jugular, axillary, basilic, radial, ulnar and upper cephalic vein. Spectral Doppler was utilized to evaluate flow at rest and with distal augmentation maneuvers. COMPARISON:  None. FINDINGS: Contralateral Subclavian Vein: Respiratory phasicity is normal and symmetric with the symptomatic side. No evidence of thrombus. Normal compressibility. Internal Jugular Vein: No evidence of thrombus. Normal compressibility, respiratory phasicity and response to augmentation. Subclavian Vein: No evidence of thrombus.  Normal compressibility, respiratory phasicity and response to augmentation. Axillary Vein: No evidence of thrombus. Normal compressibility, respiratory  phasicity and response to augmentation. Cephalic Vein: No evidence of thrombus. Normal compressibility, respiratory phasicity and response to augmentation. Basilic Vein: No evidence of thrombus. Normal compressibility, respiratory phasicity and response to augmentation. Brachial Veins: No evidence of thrombus. Normal compressibility, respiratory phasicity and response to augmentation. Radial Veins: No evidence of thrombus. Normal compressibility, respiratory phasicity and response to augmentation. Ulnar Veins: No evidence of thrombus. Normal compressibility, respiratory phasicity and response to augmentation. Venous Reflux:  None visualized. Other Findings:  None visualized. IMPRESSION: No evidence of deep venous thrombosis. Electronically Signed   By: Dorise Bullion III M.D   On: 09/30/2015 09:22   ASSESSMENT AND PLAN:   Principal Problem:   Cellulitis  * Cellulitis on left arm and hand   IV ABx.   No DVT as per US venous.  * Hypokalemia   ReplAce oral.  * hypertension   Cont lisinopril.  All the records are reviewed and case discussed with Care Management/Social Workerr. Management plans discussed with the patient, family and they are in agreement.  CODE STATUS: full.  TOTAL TIME TAKING CARE OF THIS PATIENT: 35 minutes.     POSSIBLE D/C IN 1-2 DAYS, DEPENDING ON CLINICAL CONDITION.   Vaughan Basta M.D on 10/01/2015   Between 7am to 6pm - Pager - [phone]  After 6pm go to [email or web address] - password [email or web address] [facility]  Office  [phone]  CC: Primary care physician; No PCP Per Patient  Note: This dictation was prepared with Dragon dictation along with smaller phrase technology. Any transcriptional errors that result from this process are unintentional.

## 2015-10-01 NOTE — ED Triage Notes (Signed)
Pt presents to ED with c/o left hand/arm swelling. Pt states he was seen in this ED earlier yesterday morning and was called back to ED to have swelling re-checked and possibly received iv antibiotics. Swelling noted.

## 2015-10-02 LAB — BASIC METABOLIC PANEL
Anion gap: 9 (ref 5–15)
BUN: 12 mg/dL (ref 6–20)
CHLORIDE: 98 mmol/L — AB (ref 101–111)
CO2: 28 mmol/L (ref 22–32)
Calcium: 8.7 mg/dL — ABNORMAL LOW (ref 8.9–10.3)
Creatinine, Ser: 1.01 mg/dL (ref 0.61–1.24)
GFR calc Af Amer: >60 mL/min (ref >60)
GFR calc non Af Amer: >60 mL/min (ref >60)
GLUCOSE: 125 mg/dL — AB (ref 65–99)
POTASSIUM: 3.6 mmol/L (ref 3.5–5.1)
Sodium: 135 mmol/L (ref 135–145)

## 2015-10-02 LAB — CBC
HEMATOCRIT: 42.4 % (ref 40.0–52.0)
HEMOGLOBIN: 14.6 g/dL (ref 13.0–18.0)
MCH: 29.3 pg (ref 26.0–34.0)
MCHC: 34.3 g/dL (ref 32.0–36.0)
MCV: 85.5 fL (ref 80.0–100.0)
Platelets: 240 10*3/uL (ref 150–440)
RBC: 4.96 MIL/uL (ref 4.40–5.90)
RDW: 15.5 % — AB (ref 11.5–14.5)
WBC: 9.8 10*3/uL (ref 3.8–10.6)

## 2015-10-02 NOTE — Care Management Note (Signed)
Case Management Note  Patient Details  Name: Mitchell Robbins MRN: [id number] Date of Birth: 08/09/1955  Subjective/Objective:    Discharge to home with PO ABX, no home health services ordered.                 Action/Plan:   Expected Discharge Date:                  Expected Discharge Plan:     In-House Referral:     Discharge planning Services     Post Acute Care Choice:    Choice offered to:     DME Arranged:    DME Agency:     HH Arranged:    HH Agency:     Status of Service:     If discussed at [employer] of Stay Meetings, dates discussed:    Additional Comments:  Bleu Minerd A, RN 10/02/2015, 11:29 AM

## 2015-10-02 NOTE — Discharge Summary (Signed)
[facility] [place] NAME: Mitchell Robbins    MR#:  [id number]  DATE OF BIRTH:  Jul 30, 1955  DATE OF ADMISSION:  10/01/2015 ADMITTING PHYSICIAN: Saundra Shelling, MD  DATE OF DISCHARGE: 10/02/2015  PRIMARY CARE PHYSICIAN: No PCP Per Patient    ADMISSION DIAGNOSIS:  Cellulitis of left upper extremity [id number]  DISCHARGE DIAGNOSIS:  Principal Problem:   Cellulitis   SECONDARY DIAGNOSIS:   Past Medical History:  Diagnosis Date  . Hypertension     HOSPITAL COURSE:   * Cellulitis on left arm and hand   IV ABx.   No DVT as per US venous.   Felt better the next day, blood culture negative, WBC count came down.   Cont clindamycin - as he was prescribed from ER- before he came in.   He also have prescription of oxycodone by ER.  * Hypokalemia   ReplAced oral.  * hypertension   Cont lisinopril.   DISCHARGE CONDITIONS:   Stable.  CONSULTS OBTAINED:    DRUG ALLERGIES:  No Known Allergies  DISCHARGE MEDICATIONS:   Current Discharge Medication List    CONTINUE these medications which have NOT CHANGED   Details  clindamycin (CLEOCIN) 300 MG capsule Take 1 capsule (300 mg total) by mouth 3 (three) times daily. Qty: 30 capsule, Refills: 0    hydrochlorothiazide (HYDRODIURIL) 50 MG tablet Take 50 mg by mouth daily.    HYDROcodone-acetaminophen (NORCO/VICODIN) 5-325 MG tablet Take 1 tablet by mouth every 6 (six) hours as needed for moderate pain. Qty: 10 tablet, Refills: 0    lisinopril (PRINIVIL,ZESTRIL) 20 MG tablet Take 20 mg by mouth daily.         DISCHARGE INSTRUCTIONS:    Follow with PMD in 1 month.  If you experience worsening of your admission symptoms, develop shortness of breath, life threatening emergency, suicidal or homicidal thoughts you must seek medical attention immediately by calling 911 or calling your MD immediately  if symptoms less severe.  You Must read complete instructions/literature  along with all the possible adverse reactions/side effects for all the Medicines you take and that have been prescribed to you. Take any new Medicines after you have completely understood and accept all the possible adverse reactions/side effects.   Please note  You were cared for by a hospitalist during your hospital stay. If you have any questions about your discharge medications or the care you received while you were in the hospital after you are discharged, you can call the unit and asked to speak with the hospitalist on call if the hospitalist that took care of you is not available. Once you are discharged, your primary care physician will handle any further medical issues. Please note that NO REFILLS for any discharge medications will be authorized once you are discharged, as it is imperative that you return to your primary care physician (or establish a relationship with a primary care physician if you do not have one) for your aftercare needs so that they can reassess your need for medications and monitor your lab values.    Today   CHIEF COMPLAINT:   Chief Complaint  Patient presents with  . Hand Pain    HISTORY OF PRESENT ILLNESS:  Mitchell Robbins  is a 60 y.o. male with a known history of Hypertension presented to the emergency room with pain and swelling in the left hand and left forearm. This has been going on since Friday and patient came to the emergency  room on Saturday but refused to stay. The pain is aching in nature 8 out of 10 on a scale of 1-10. The swelling has gradually increased for the last 2 days and that is swelling of the hand as well as the left forearm. Tenderness is also present along the left wrist. No history of any fever or chills. Patient was discharged during the prior visit to the emergency room on oral clindamycin and patient has been taking this medication but says it the cellulitis has worsened. No complaints of any chest pain. No complaints of shortness of  breath. No complaints of headache dizziness blurry vision. No history of syncope or seizure. No history of any IV drug abuse. Patient was worked up with a Doppler ultrasound of left upper extremity which showed no DVT. Hospitalist service was consulted for further care of the patient.   VITAL SIGNS:  Blood pressure 136/67, pulse 66, temperature 98.3 F (36.8 C), temperature source Oral, resp. rate 20, height 5\' 11"  (1.803 m), weight 113.4 kg (250 lb), SpO2 93 %.  I/O:   Intake/Output Summary (Last 24 hours) at 10/02/15 1120 Last data filed at 10/02/15 0900  Gross per 24 hour  Intake           984.31 ml  Output                0 ml  Net           984.31 ml    PHYSICAL EXAMINATION:  GENERAL:  60 y.o.-year-old patient lying in the bed with no acute distress.  EYES: Pupils equal, round, reactive to light and accommodation. No scleral icterus. Extraocular muscles intact.  HEENT: Head atraumatic, normocephalic. Oropharynx and nasopharynx clear.  NECK:  Supple, no jugular venous distention. No thyroid enlargement, no tenderness.  LUNGS: Normal breath sounds bilaterally, no wheezing, rales,rhonchi or crepitation. No use of accessory muscles of respiration.  CARDIOVASCULAR: S1, S2 normal. No murmurs, rubs, or gallops.  ABDOMEN: Soft, non-tender, non-distended. Bowel sounds present. No organomegaly or mass.  EXTREMITIES: No pedal edema, cyanosis, or clubbing.    Left hand and distal forearm is swollen, but palpable pulses and sensation preserved. NEUROLOGIC: Cranial nerves II through XII are intact. Muscle strength 5/5 in all extremities. Sensation intact. Gait not checked.  PSYCHIATRIC: The patient is alert and oriented x 3.  SKIN: No obvious rash, lesion, or ulcer.   DATA REVIEW:   CBC  Recent Labs Lab 10/02/15 0433  WBC 9.8  HGB 14.6  HCT 42.4  PLT 240    Chemistries   Recent Labs Lab 10/01/15 0607 10/02/15 0433  NA 138 135  K 3.2* 3.6  CL 104 98*  CO2 26 28  GLUCOSE  140* 125*  BUN 13 12  CREATININE 1.17 1.01  CALCIUM 9.3 8.7*  AST 29  --   ALT 27  --   ALKPHOS 65  --   BILITOT 0.6  --     Cardiac Enzymes No results for input(s): TROPONINI in the last 168 hours.  Microbiology Results  Results for orders placed or performed during the hospital encounter of 10/01/15  Blood culture (routine x 2)     Status: None (Preliminary result)   Collection Time: 10/01/15  6:57 AM  Result Value Ref Range Status   Specimen Description BLOOD RIGHT HAND  Final   Special Requests   Final    BOTTLES DRAWN AEROBIC AND ANAEROBIC AER 11ML ANA 10ML   Culture NO GROWTH 1 DAY  Final  Report Status PENDING  Incomplete  Blood culture (routine x 2)     Status: None (Preliminary result)   Collection Time: 10/01/15  6:57 AM  Result Value Ref Range Status   Specimen Description BLOOD RIGHT AC  Final   Special Requests BOTTLES DRAWN AEROBIC AND ANAEROBIC 8ML  Final   Culture NO GROWTH 1 DAY  Final   Report Status PENDING  Incomplete    RADIOLOGY:  No results found.  EKG:   Orders placed or performed in visit on 01/11/03  . EKG 12-Lead      Management plans discussed with the patient, family and they are in agreement.  CODE STATUS:     Code Status Orders        Start     Ordered   10/01/15 0817  Full code  Continuous     10/01/15 0816    Code Status History    Date Active Date Inactive Code Status Order ID Comments User Context   This patient has a current code status but no historical code status.      TOTAL TIME TAKING CARE OF THIS PATIENT: 35 minutes.    Vaughan Basta M.D on 10/02/2015 at 11:20 AM  Between 7am to 6pm - Pager - [phone]  After 6pm go to [email or web address] - password [email or web address] [facility]  Office  [phone]  CC: Primary care physician; No PCP Per Patient   Note: This dictation was prepared with Dragon dictation along with smaller phrase technology. Any transcriptional errors that result  from this process are unintentional.

## 2015-10-02 NOTE — Progress Notes (Signed)
[place], [place].   10/02/2015  Patient: Mitchell Robbins   Date of Birth:  January 12, 1956  Date of admission:  10/01/2015  Date of Discharge  10/02/2015    To Whom it May Concern:   Arris Rauth  may return to work on 10/08/15.  PHYSICAL ACTIVITY:  Full  If you have any questions or concerns, please don't hesitate to call.  Sincerely,   Vaughan Basta M.D Pager [phone] Office : [phone]   .

## 2015-10-02 NOTE — Progress Notes (Signed)
Patient discharged to home. IV site removed. Discharge summary given to patient.

## 2015-10-06 LAB — CULTURE, BLOOD (ROUTINE X 2)
CULTURE: NO GROWTH
CULTURE: NO GROWTH

## 2017-12-22 ENCOUNTER — Other Ambulatory Visit: Payer: Self-pay

## 2017-12-22 ENCOUNTER — Encounter: Payer: Self-pay | Admitting: Emergency Medicine

## 2017-12-22 DIAGNOSIS — Z7982 Long term (current) use of aspirin: Secondary | ICD-10-CM

## 2017-12-22 DIAGNOSIS — I10 Essential (primary) hypertension: Secondary | ICD-10-CM | POA: Diagnosis present

## 2017-12-22 DIAGNOSIS — Z79891 Long term (current) use of opiate analgesic: Secondary | ICD-10-CM

## 2017-12-22 DIAGNOSIS — K2900 Acute gastritis without bleeding: Secondary | ICD-10-CM | POA: Diagnosis present

## 2017-12-22 DIAGNOSIS — Z8249 Family history of ischemic heart disease and other diseases of the circulatory system: Secondary | ICD-10-CM

## 2017-12-22 DIAGNOSIS — Z79899 Other long term (current) drug therapy: Secondary | ICD-10-CM

## 2017-12-22 DIAGNOSIS — N179 Acute kidney failure, unspecified: Secondary | ICD-10-CM | POA: Diagnosis present

## 2017-12-22 DIAGNOSIS — E890 Postprocedural hypothyroidism: Secondary | ICD-10-CM | POA: Diagnosis present

## 2017-12-22 DIAGNOSIS — K449 Diaphragmatic hernia without obstruction or gangrene: Secondary | ICD-10-CM | POA: Diagnosis present

## 2017-12-22 DIAGNOSIS — D62 Acute posthemorrhagic anemia: Secondary | ICD-10-CM | POA: Diagnosis present

## 2017-12-22 DIAGNOSIS — K264 Chronic or unspecified duodenal ulcer with hemorrhage: Principal | ICD-10-CM | POA: Diagnosis present

## 2017-12-22 DIAGNOSIS — R739 Hyperglycemia, unspecified: Secondary | ICD-10-CM | POA: Diagnosis present

## 2017-12-22 DIAGNOSIS — F17210 Nicotine dependence, cigarettes, uncomplicated: Secondary | ICD-10-CM | POA: Diagnosis present

## 2017-12-22 DIAGNOSIS — E860 Dehydration: Secondary | ICD-10-CM | POA: Diagnosis present

## 2017-12-22 LAB — CBC
HCT: 35.2 % — ABNORMAL LOW (ref 39.0–52.0)
Hemoglobin: 11.7 g/dL — ABNORMAL LOW (ref 13.0–17.0)
MCH: 29 pg (ref 26.0–34.0)
MCHC: 33.2 g/dL (ref 30.0–36.0)
MCV: 87.1 fL (ref 80.0–100.0)
PLATELETS: 268 10*3/uL (ref 150–400)
RBC: 4.04 MIL/uL — ABNORMAL LOW (ref 4.22–5.81)
RDW: 15.2 % (ref 11.5–15.5)
WBC: 16.4 10*3/uL — ABNORMAL HIGH (ref 4.0–10.5)
nRBC: 0 % (ref 0.0–0.2)

## 2017-12-22 LAB — COMPREHENSIVE METABOLIC PANEL
ALK PHOS: 47 U/L (ref 38–126)
ALT: 21 U/L (ref 0–44)
AST: 25 U/L (ref 15–41)
Albumin: 3.8 g/dL (ref 3.5–5.0)
Anion gap: 10 (ref 5–15)
BILIRUBIN TOTAL: 0.7 mg/dL (ref 0.3–1.2)
BUN: 45 mg/dL — ABNORMAL HIGH (ref 8–23)
CALCIUM: 8.8 mg/dL — AB (ref 8.9–10.3)
CO2: 22 mmol/L (ref 22–32)
CREATININE: 1.49 mg/dL — AB (ref 0.61–1.24)
Chloride: 105 mmol/L (ref 98–111)
GFR calc Af Amer: 56 mL/min — ABNORMAL LOW (ref >60)
GFR, EST NON AFRICAN AMERICAN: 48 mL/min — AB (ref >60)
Glucose, Bld: 135 mg/dL — ABNORMAL HIGH (ref 70–99)
Potassium: 4 mmol/L (ref 3.5–5.1)
SODIUM: 137 mmol/L (ref 135–145)
Total Protein: 6.5 g/dL (ref 6.5–8.1)

## 2017-12-22 NOTE — ED Triage Notes (Signed)
Pt reports that he has developed dark tarry stool today and has been week.

## 2017-12-23 ENCOUNTER — Inpatient Hospital Stay
Admission: EM | Admit: 2017-12-23 | Discharge: 2017-12-25 | DRG: 378 | Disposition: A | Discharge status: Home / Self Care | Payer: Self-pay | Attending: Internal Medicine | Admitting: Internal Medicine

## 2017-12-23 ENCOUNTER — Emergency Department: Payer: Self-pay

## 2017-12-23 ENCOUNTER — Encounter: Payer: Self-pay | Admitting: Internal Medicine

## 2017-12-23 DIAGNOSIS — N289 Disorder of kidney and ureter, unspecified: Secondary | ICD-10-CM

## 2017-12-23 DIAGNOSIS — K2900 Acute gastritis without bleeding: Secondary | ICD-10-CM

## 2017-12-23 DIAGNOSIS — K921 Melena: Secondary | ICD-10-CM

## 2017-12-23 DIAGNOSIS — E860 Dehydration: Secondary | ICD-10-CM

## 2017-12-23 DIAGNOSIS — K269 Duodenal ulcer, unspecified as acute or chronic, without hemorrhage or perforation: Secondary | ICD-10-CM

## 2017-12-23 DIAGNOSIS — K625 Hemorrhage of anus and rectum: Secondary | ICD-10-CM

## 2017-12-23 LAB — URINALYSIS, COMPLETE (UACMP) WITH MICROSCOPIC
BILIRUBIN URINE: NEGATIVE
Bacteria, UA: NONE SEEN
Glucose, UA: NEGATIVE mg/dL
HGB URINE DIPSTICK: NEGATIVE
Ketones, ur: NEGATIVE mg/dL
Leukocytes, UA: NEGATIVE
Nitrite: NEGATIVE
PH: 7 (ref 5.0–8.0)
Protein, ur: NEGATIVE mg/dL
SPECIFIC GRAVITY, URINE: 1.013 (ref 1.005–1.030)
Squamous Epithelial / LPF: NONE SEEN (ref 0–5)

## 2017-12-23 LAB — HEMOGLOBIN A1C
Hgb A1c MFr Bld: 5.8 % — ABNORMAL HIGH (ref 4.8–5.6)
Mean Plasma Glucose: 119.76 mg/dL

## 2017-12-23 LAB — MAGNESIUM: MAGNESIUM: 1.9 mg/dL (ref 1.7–2.4)

## 2017-12-23 LAB — TROPONIN I: Troponin I: <0.03 ng/mL (ref <0.03)

## 2017-12-23 LAB — PHOSPHORUS: Phosphorus: 3 mg/dL (ref 2.5–4.6)

## 2017-12-23 LAB — TYPE AND SCREEN
ABO/RH(D): A POS
ANTIBODY SCREEN: NEGATIVE

## 2017-12-23 LAB — PROTIME-INR
INR: 1.11
Prothrombin Time: 14.2 seconds (ref 11.4–15.2)

## 2017-12-23 MED ORDER — SODIUM CHLORIDE 0.9 % IV BOLUS
1000.0000 mL | Freq: Once | INTRAVENOUS | Status: AC
  Administered 2017-12-23: 1000 mL via INTRAVENOUS

## 2017-12-23 MED ORDER — ONDANSETRON HCL 4 MG PO TABS: 4.0000 mg | ORAL_TABLET | Freq: Four times a day (QID) | ORAL | Status: DC | PRN

## 2017-12-23 MED ORDER — ACETAMINOPHEN 325 MG PO TABS: 650.0000 mg | ORAL_TABLET | Freq: Four times a day (QID) | ORAL | Status: DC | PRN

## 2017-12-23 MED ORDER — SENNOSIDES-DOCUSATE SODIUM 8.6-50 MG PO TABS: 1.0000 | ORAL_TABLET | Freq: Every evening | ORAL | Status: DC | PRN

## 2017-12-23 MED ORDER — ACETAMINOPHEN 650 MG RE SUPP: 650.0000 mg | Freq: Four times a day (QID) | RECTAL | Status: DC | PRN

## 2017-12-23 MED ORDER — LACTATED RINGERS IV SOLN
INTRAVENOUS | Status: DC
  Administered 2017-12-23: 03:00:00 via INTRAVENOUS

## 2017-12-23 MED ORDER — HYDROCHLOROTHIAZIDE 25 MG PO TABS: 25.0000 mg | ORAL_TABLET | Freq: Every day | ORAL | Status: DC

## 2017-12-23 MED ORDER — ONDANSETRON HCL 4 MG/2ML IJ SOLN: 4.0000 mg | Freq: Four times a day (QID) | INTRAMUSCULAR | Status: DC | PRN

## 2017-12-23 MED ORDER — LISINOPRIL 20 MG PO TABS: 20.0000 mg | ORAL_TABLET | Freq: Every day | ORAL | Status: DC

## 2017-12-23 MED ORDER — BISACODYL 5 MG PO TBEC: 5.0000 mg | DELAYED_RELEASE_TABLET | Freq: Every day | ORAL | Status: DC | PRN

## 2017-12-23 MED ORDER — SODIUM CHLORIDE 0.9 % IV SOLN
8.0000 mg/h | INTRAVENOUS | Status: DC
  Administered 2017-12-23 – 2017-12-25 (×5): 8 mg/h via INTRAVENOUS
  Filled 2017-12-23 (×5): qty 80

## 2017-12-23 MED ORDER — AMLODIPINE BESYLATE 10 MG PO TABS
10.0000 mg | ORAL_TABLET | Freq: Every day | ORAL | Status: DC
  Administered 2017-12-25: 10 mg via ORAL
  Filled 2017-12-23 (×2): qty 1

## 2017-12-23 MED ORDER — PANTOPRAZOLE SODIUM 40 MG IV SOLR
40.0000 mg | Freq: Once | INTRAVENOUS | Status: AC
  Administered 2017-12-23: 40 mg via INTRAVENOUS
  Filled 2017-12-23: qty 40

## 2017-12-23 MED ORDER — DEXTROSE-NACL 5-0.45 % IV SOLN
INTRAVENOUS | Status: DC
  Administered 2017-12-23 – 2017-12-25 (×5): via INTRAVENOUS

## 2017-12-23 MED ORDER — SODIUM CHLORIDE 0.9 % IV SOLN
INTRAVENOUS | Status: DC
  Administered 2017-12-24: 12:00:00 via INTRAVENOUS

## 2017-12-23 NOTE — Consult Note (Signed)
Mitchell Robbins , MD [address], [place], [place], [place], [address] [address] [place], [place], [place], [address] Phone: [phone]  Fax: [phone]  Consultation  Referring Provider: Dr Jerelyn Charles  Primary Care Physician:  [facility], [facility] Primary Gastroenterologist: None          Reason for Consultation:     Melena   Date of Admission:  12/23/2017 Date of Consultation:  12/23/2017         HPI:   Mitchell Robbins is a 62 y.o. male he says that he has been having black tarry stools for the past 2 days.  Yesterday he also had some bright red blood per rectum.  He denies any similar episodes in the past.  He denies any abdominal pain.  He denies use of any blood thinners.  He denies use of any NSAIDs.  Presently his only complaint is that he wants to eat and is feeling hungry.  Hb on admission was 11.7 gra,s with MCV 87 and platelet count of 268 , Cr 1.49 ,albumin 3.8 INR/Prothrombin Time 1.1   Past Medical History:  Diagnosis Date  . Hypertension     Past Surgical History:  Procedure Laterality Date  . THYROIDECTOMY      Prior to Admission medications   Medication Sig Start Date End Date Taking? Authorizing Provider  amLODipine (NORVASC) 10 MG tablet 1 BY MOUTH DAILY FOR HIGH BLOOD PRESSURE 09/21/17  Yes [provider]  aspirin EC 81 MG tablet Take 81 mg by mouth daily.   Yes [provider]  hydrochlorothiazide (HYDRODIURIL) 50 MG tablet Take 50 mg by mouth daily.   Yes [provider]  lisinopril (PRINIVIL,ZESTRIL) 20 MG tablet Take 20 mg by mouth daily.   Yes [provider]  HYDROcodone-acetaminophen (NORCO/VICODIN) 5-325 MG tablet Take 1 tablet by mouth every 6 (six) hours as needed for moderate pain. 09/30/15   Lisa Roca, MD    Family History  Problem Relation Age of Onset  . Hypertension Mother      Social History   Tobacco Use  . Smoking status: Current Every Day Smoker  . Smokeless tobacco: Never  Used  Substance Use Topics  . Alcohol use: No    Comment: quit drinking alcohol long time ago  . Drug use: No    Allergies as of 12/22/2017  . (No Known Allergies)    Review of Systems:    All systems reviewed and negative except where noted in HPI.   Physical Exam:  Vital signs in last 24 hours: Temp:  [98.4 F (36.9 C)-98.7 F (37.1 C)] 98.4 F (36.9 C) (10/16 0230) Pulse Rate:  [83-120] 83 (10/16 0230) Resp:  [16-20] 18 (10/16 0230) BP: (101-157)/(52-143) 101/52 (10/16 0230) SpO2:  [95 %-99 %] 98 % (10/16 0230) Weight:  [117.9 kg] 117.9 kg (10/15 2039) Last BM Date: 12/22/17 General:   Pleasant, cooperative in NAD Head:  Normocephalic and atraumatic. Eyes:   No icterus.   Conjunctiva pink. PERRLA. Ears:  Normal auditory acuity. Neck:  Supple; no masses or thyroidomegaly Lungs: Respirations even and unlabored. Lungs clear to auscultation bilaterally.   No wheezes, crackles, or rhonchi.  Heart:  Regular rate and rhythm;  Without murmur, clicks, rubs or gallops Abdomen:  Soft, nondistended, nontender. Normal bowel sounds. No appreciable masses or hepatomegaly.  No rebound or guarding.  Neurologic:  Alert and oriented x3;  grossly normal neurologically. Skin:  Intact without significant lesions or rashes. Cervical Nodes:  No significant  cervical adenopathy. Psych:  Alert and cooperative. Normal affect.  LAB RESULTS: Recent Labs    12/22/17 2053  WBC 16.4*  HGB 11.7*  HCT 35.2*  PLT 268   BMET Recent Labs    12/22/17 2053  NA 137  K 4.0  CL 105  CO2 22  GLUCOSE 135*  BUN 45*  CREATININE 1.49*  CALCIUM 8.8*   LFT Recent Labs    12/22/17 2053  PROT 6.5  ALBUMIN 3.8  AST 25  ALT 21  ALKPHOS 47  BILITOT 0.7   PT/INR Recent Labs    12/23/17 0109  LABPROT 14.2  INR 1.11    STUDIES: Dg Chest Port 1 View  Result Date: 12/23/2017 CLINICAL DATA:  Dark tarry stools, short of breath EXAM: PORTABLE CHEST 1 VIEW COMPARISON:  11/29/2014 FINDINGS: No  focal opacity or pleural effusion. Borderline cardiomegaly. No pneumothorax. No free air beneath the diaphragms. IMPRESSION: No active disease. Electronically Signed   By: Donavan Foil M.D.   On: 12/23/2017 01:03      Impression / Plan:   Mitchell Robbins is a 62 y.o. y/o male admitted with melena of a few days duration. Labs suggest a normocytic anemia with good liver function making it less likely a variceal bleed. Elevated Bun/Cr ration suggesting a possible upper GI bleed. Hb 11.7 grams on admission- baseline 14.6 , two years back.  No prior upper endoscopy evaluation.  Recalls a colonoscopy many years back which was normal.   Plan  1. IV PPI 2. EGD tomorrow with Dr Allen Norris.  If EGD is negative then will need colonoscopy. 3. Monitor CBC and transfuse as needed.   I have discussed alternative options, risks & benefits,  which include, but are not limited to, bleeding, infection, perforation,respiratory complication & drug reaction.  The patient agrees with this plan & written consent will be obtained.    Thank you for involving me in the care of this patient.      LOS: 0 days   Mitchell Bellows, MD  12/23/2017, 10:03 AM

## 2017-12-23 NOTE — H&P (Signed)
[place] at [place] NAME: Mitchell Robbins    MR#:  [id number]  DATE OF BIRTH:  01-29-1956  DATE OF ADMISSION:  12/23/2017  PRIMARY CARE PHYSICIAN: [facility]   REQUESTING/REFERRING PHYSICIAN: Paulette Blanch, MD  CHIEF COMPLAINT:   Chief Complaint  Patient presents with  . Rectal Bleeding    HISTORY OF PRESENT ILLNESS:  Mitchell Robbins  is a 62 y.o. male with a known history of HTN p/w GI bleed, generalized weakness. Pt is stoic and does not say much. He endorses 4-5d Hx dark tarry stools. He states he passed BRBPR on Tuesday (12/22/2017) morning. This continued throughout the day, 3-4x episodes. He developed generalized weakness. He denies nausea/vomiting. He denies AP at first, but his wife says that he had complained of RLQ pain at some time on Monday (10/14). He is dehydrated. 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 He uses NSAIDs occasionally, but not daily. He smokes 1ppd. He first said he drinks EtOH occasionally, but then said he has been more recently drinking 2-3 beers every night after work. He states his last colonoscopy was > 69yrs ago. He has never had an endoscopy. He does not remember who performed his colonoscopy. He takes a daily ASA81 at home, at the behest of his PCP. He is not on systemic AC. He has not had GI bleeding in the past.  PAST MEDICAL HISTORY:   Past Medical History:  Diagnosis Date  . Hypertension     PAST SURGICAL HISTORY:   Past Surgical History:  Procedure Laterality Date  . THYROIDECTOMY      SOCIAL HISTORY:   Social History   Tobacco Use  . Smoking status: Current Every Day Smoker  . Smokeless tobacco: Never Used  Substance Use Topics  . Alcohol use: No    Comment: quit drinking alcohol long time ago    FAMILY HISTORY:   Family History  Problem Relation Age of Onset  . Hypertension Mother     DRUG ALLERGIES:  No Known Allergies  REVIEW OF  SYSTEMS:   Review of Systems  Constitutional: Positive for malaise/fatigue. Negative for chills, diaphoresis, fever and weight loss.  HENT: Negative for congestion, ear pain, hearing loss, nosebleeds, sinus pain, sore throat and tinnitus.   Eyes: Negative for blurred vision, double vision and photophobia.  Respiratory: Negative for cough, hemoptysis, sputum production, shortness of breath and wheezing.   Cardiovascular: Negative for chest pain, palpitations, orthopnea, claudication, leg swelling and PND.  Gastrointestinal: Positive for abdominal pain (Questionable RLQ AP (per wife)), blood in stool and melena. Negative for constipation, diarrhea, heartburn, nausea and vomiting.  Genitourinary: Negative for dysuria, flank pain, frequency, hematuria and urgency.  Musculoskeletal: Negative for back pain, falls, joint pain, myalgias and neck pain.  Skin: Negative for itching and rash.  Neurological: Positive for weakness. Negative for dizziness, tingling, tremors, sensory change, speech change, focal weakness, seizures, loss of consciousness and headaches.  Psychiatric/Behavioral: Negative for memory loss. The patient does not have insomnia.    MEDICATIONS AT HOME:   Prior to Admission medications   Medication Sig Start Date End Date Taking? Authorizing Provider  amLODipine (NORVASC) 10 MG tablet 1 BY MOUTH DAILY FOR HIGH BLOOD PRESSURE 09/21/17  Yes [provider]  aspirin EC 81 MG tablet Take 81 mg by mouth daily.   Yes [provider]  hydrochlorothiazide (HYDRODIURIL) 50 MG tablet Take 50 mg by mouth daily.  Yes [provider]  lisinopril (PRINIVIL,ZESTRIL) 20 MG tablet Take 20 mg by mouth daily.   Yes [provider]  HYDROcodone-acetaminophen (NORCO/VICODIN) 5-325 MG tablet Take 1 tablet by mouth every 6 (six) hours as needed for moderate pain. 09/30/15   Lisa Roca, MD      VITAL SIGNS:  Blood pressure (!) 101/52, pulse 83, temperature 98.4 F  (36.9 C), temperature source Oral, resp. rate 18, height 5\' 11"  (1.803 m), weight 117.9 kg, SpO2 98 %.  PHYSICAL EXAMINATION:  Physical Exam  Constitutional: He is oriented to person, place, and time. He appears well-developed and well-nourished. He is active and cooperative.  Non-toxic appearance. He does not have a sickly appearance. He does not appear ill. No distress. He is not intubated.  HENT:  Head: Atraumatic.  Mouth/Throat: Oropharynx is clear and moist. No oropharyngeal exudate.  Eyes: Conjunctivae, EOM and lids are normal. No scleral icterus.  Neck: Neck supple. No JVD present. No thyromegaly present.  Cardiovascular: Normal rate, regular rhythm, S1 normal, S2 normal and normal heart sounds.  No extrasystoles are present. Exam reveals no gallop, no S3, no S4, no distant heart sounds and no friction rub.  No murmur heard. Pulmonary/Chest: Effort normal. No accessory muscle usage or stridor. No apnea, no tachypnea and no bradypnea. He is not intubated. No respiratory distress. He has decreased breath sounds in the right upper field, the right middle field, the right lower field, the left upper field, the left middle field and the left lower field. He has no wheezes. He has no rhonchi. He has no rales.  Abdominal: Soft. He exhibits no distension. Bowel sounds are decreased. There is no tenderness. There is no rigidity, no rebound and no guarding.  Musculoskeletal: Normal range of motion. He exhibits no edema or tenderness.  Lymphadenopathy:    He has no cervical adenopathy.  Neurological: He is alert and oriented to person, place, and time. He is not disoriented.  Skin: Skin is warm, dry and intact. No rash noted. He is not diaphoretic. No erythema.  Psychiatric: He has a normal mood and affect. His speech is normal and behavior is normal. Judgment and thought content normal. Cognition and memory are normal.   LABORATORY PANEL:   CBC Recent Labs  Lab 12/22/17 2053  WBC 16.4*  HGB  11.7*  HCT 35.2*  PLT 268   ------------------------------------------------------------------------------------------------------------------  Chemistries  Recent Labs  Lab 12/22/17 2053 12/23/17 0434  NA 137  --   K 4.0  --   CL 105  --   CO2 22  --   GLUCOSE 135*  --   BUN 45*  --   CREATININE 1.49*  --   CALCIUM 8.8*  --   MG  --  1.9  AST 25  --   ALT 21  --   ALKPHOS 47  --   BILITOT 0.7  --    ------------------------------------------------------------------------------------------------------------------  Cardiac Enzymes Recent Labs  Lab 12/23/17 0109  TROPONINI <0.03   ------------------------------------------------------------------------------------------------------------------  RADIOLOGY:  Dg Chest Port 1 View  Result Date: 12/23/2017 CLINICAL DATA:  Dark tarry stools, short of breath EXAM: PORTABLE CHEST 1 VIEW COMPARISON:  11/29/2014 FINDINGS: No focal opacity or pleural effusion. Borderline cardiomegaly. No pneumothorax. No free air beneath the diaphragms. IMPRESSION: No active disease. Electronically Signed   By: Donavan Foil M.D.   On: 12/23/2017 01:03   IMPRESSION AND PLAN:   A/P: 23M p/w GI bleed, melena + hematochezia, generalized weakness, (normocytic) acute blood loss  anemia. Hyperglycemia, AKI, leukocytosis. -GI bleed, melena + hematochezia, generalized weakness, (normocytic) acute blood loss anemia: Pt p/w 4-5d Hx melena, 1d Hx hematochezia/BRBPR. Endorses 1d Hx generalized weakness. Hgb 11.7, MCV 87.1. Protonix. GI consult, suspect pt will need EGD + colonoscopy. NPO for now. SCDs, no pharmacological DVT PPx. -Dehydration: IVF, encourage PO fluids. -AKI: Cr 1.49 on admission. Baseline Cr 1.0-1.1. (+) AKI, likely prerenal, (+) dehydration (as above), BUN/Cr ratio = ~30.2. IVF. Monitor BMP, avoid nephrotoxins. Consider urine studies, renal U/S if no improvement. -c/w home meds/formulary subs. -FEN/GI: NPO for now. -DVT PPx: SCDs, no  pharmacological DVT PPx (active GI bleed). -Code status: Full code. -Disposition: Admission, > 2 midnights.   All the records are reviewed and case discussed with ED provider. Management plans discussed with the patient, family and they are in agreement.  CODE STATUS: Full code.  TOTAL TIME TAKING CARE OF THIS PATIENT: 75 minutes.    Arta Silence M.D on 12/23/2017 at 6:04 AM  Between 7am to 6pm - Pager - [phone]  After 6pm go to [email or web address] - Technical brewer  Hospitalists  Office  [phone]  CC: Primary care physician; [facility], [facility]   Note: This dictation was prepared with Dragon dictation along with smaller phrase technology. Any transcriptional errors that result from this process are unintentional.

## 2017-12-23 NOTE — ED Provider Notes (Signed)
[facility] Emergency Department Provider Note   ____________________________________________   First MD Initiated Contact with Patient 12/23/17 0018     (approximate)  I have reviewed the triage vital signs and the nursing notes.   HISTORY  Chief Complaint Rectal Bleeding    HPI Mitchell Robbins is a 62 y.o. male who presents to the ED from home with a chief complaint of dark tarry stools.  Patient reports a 2 to 3-day history of dark tarry stools.  Denies taking anticoagulants other than a baby aspirin daily.  Denies excessive use of NSAIDs or Goody powders.  Denies EtOH.  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  Felt weak, dizzy and lightheaded this evening.  Had another bloody bowel movement and presents to the ED for evaluation.  Denies recent fever, chills, chest pain, shortness of breath, nausea, vomiting.   Past Medical History:  Diagnosis Date  . Hypertension     Patient Active Problem List   Diagnosis Date Noted  . Melena 12/23/2017  . Cellulitis 10/01/2015    Past Surgical History:  Procedure Laterality Date  . THYROIDECTOMY      Prior to Admission medications   Medication Sig Start Date End Date Taking? Authorizing Provider  amLODipine (NORVASC) 10 MG tablet 1 BY MOUTH DAILY FOR HIGH BLOOD PRESSURE 09/21/17  Yes [provider]  aspirin EC 81 MG tablet Take 81 mg by mouth daily.   Yes [provider]  hydrochlorothiazide (HYDRODIURIL) 50 MG tablet Take 50 mg by mouth daily.   Yes [provider]  lisinopril (PRINIVIL,ZESTRIL) 20 MG tablet Take 20 mg by mouth daily.   Yes [provider]  HYDROcodone-acetaminophen (NORCO/VICODIN) 5-325 MG tablet Take 1 tablet by mouth every 6 (six) hours as needed for moderate pain. 09/30/15   Lisa Roca, MD    Allergies Patient has no known allergies.  Family History  Problem Relation Age of Onset  . Hypertension Mother     Social  History Social History   Tobacco Use  . Smoking status: Current Every Day Smoker  . Smokeless tobacco: Never Used  Substance Use Topics  . Alcohol use: No    Comment: quit drinking alcohol long time ago  . Drug use: No    Review of Systems  Constitutional: No fever/chills Eyes: No visual changes. ENT: No sore throat. Cardiovascular: Denies chest pain. Respiratory: Denies shortness of breath. Gastrointestinal: Positive for bloody stools.  No abdominal pain.  No nausea, no vomiting.  No diarrhea.  No constipation. Genitourinary: Negative for dysuria. Musculoskeletal: Negative for back pain. Skin: Negative for rash. Neurological: Negative for headaches, focal weakness or numbness.   ____________________________________________   PHYSICAL EXAM:  VITAL SIGNS: ED Triage Vitals  Enc Vitals Group     BP 12/22/17 2038 123/72     Pulse Rate 12/22/17 2038 (!) 120     Resp 12/22/17 2038 20     Temp 12/22/17 2038 98.7 F (37.1 C)     Temp Source 12/22/17 2038 Oral     SpO2 12/22/17 2038 97 %     Weight 12/22/17 2039 260 lb (117.9 kg)     Height 12/22/17 2039 5\' 11"  (1.803 m)     Head Circumference --      Peak Flow --      Pain Score 12/22/17 2038 5     Pain Loc --      Pain Edu? --      Excl. in [place]? --  Constitutional: Alert and oriented. Well appearing and in no acute distress. Eyes: Conjunctivae are normal. PERRL. EOMI. Head: Atraumatic. Nose: No congestion/rhinnorhea. Mouth/Throat: Mucous membranes are moist.  Oropharynx non-erythematous. Neck: No stridor.   Cardiovascular: Normal rate, regular rhythm. Grossly normal heart sounds.  Good peripheral circulation. Respiratory: Normal respiratory effort.  No retractions. Lungs CTAB. Gastrointestinal: Soft and nontender. No distention. No abdominal bruits. No CVA tenderness. Musculoskeletal: No lower extremity tenderness nor edema.  No joint effusions. Neurologic:  Normal speech and language. No gross focal neurologic  deficits are appreciated. No gait instability. Skin:  Skin is warm, dry and intact. No rash noted. Psychiatric: Mood and affect are normal. Speech and behavior are normal.  ____________________________________________   LABS (all labs ordered are listed, but only abnormal results are displayed)  Labs Reviewed  COMPREHENSIVE METABOLIC PANEL - Abnormal; Notable for the following components:      Result Value   Glucose, Bld 135 (*)    BUN 45 (*)    Creatinine, Ser 1.49 (*)    Calcium 8.8 (*)    GFR calc non Af Amer 48 (*)    GFR calc Af Amer 56 (*)    All other components within normal limits  CBC - Abnormal; Notable for the following components:   WBC 16.4 (*)    RBC 4.04 (*)    Hemoglobin 11.7 (*)    HCT 35.2 (*)    All other components within normal limits  TROPONIN I  PROTIME-INR  HIV ANTIBODY (ROUTINE TESTING W REFLEX)  POC OCCULT BLOOD, ED  TYPE AND SCREEN   ____________________________________________  EKG  ED ECG REPORT I, Breshae Belcher J, the attending physician, personally viewed and interpreted this ECG.   Date: 12/23/2017  EKG Time: 0103  Rate: 103  Rhythm: sinus tachycardia  Axis: Normal  Intervals:none  ST&T Change: Nonspecific  ____________________________________________  RADIOLOGY  ED MD interpretation: No acute cardiopulmonary process  Official radiology report(s): Dg Chest Port 1 View  Result Date: 12/23/2017 CLINICAL DATA:  Dark tarry stools, short of breath EXAM: PORTABLE CHEST 1 VIEW COMPARISON:  11/29/2014 FINDINGS: No focal opacity or pleural effusion. Borderline cardiomegaly. No pneumothorax. No free air beneath the diaphragms. IMPRESSION: No active disease. Electronically Signed   By: Donavan Foil M.D.   On: 12/23/2017 01:03    ____________________________________________   PROCEDURES  Procedure(s) performed:   Rectal exam: External exam within normal limits without external hemorrhoids.  Melanotic stool obtained on gloved finger  which is immediately heme positive.  Procedures  Critical Care performed: Yes, see critical care note(s)   CRITICAL CARE Performed by: Paulette Blanch   Total critical care time: 30 minutes  Critical care time was exclusive of separately billable procedures and treating other patients.  Critical care was necessary to treat or prevent imminent or life-threatening deterioration.  Critical care was time spent personally by me on the following activities: development of treatment plan with patient and/or surrogate as well as nursing, discussions with consultants, evaluation of patient's response to treatment, examination of patient, obtaining history from patient or surrogate, ordering and performing treatments and interventions, ordering and review of laboratory studies, ordering and review of radiographic studies, pulse oximetry and re-evaluation of patient's condition.  ____________________________________________   INITIAL IMPRESSION / ASSESSMENT AND PLAN / ED COURSE  As part of my medical decision making, I reviewed the following data within the [place] History obtained from family, Nursing notes reviewed and incorporated, Labs reviewed, EKG interpreted, Old chart reviewed, Radiograph reviewed,  Discussed with admitting physician and Notes from prior ED visits   62 year old male who presents with melena. Differential diagnosis includes, but is not limited to, PUD, diverticulitis, small bowel obstruction or ileus, colitis, gastroenteritis, etc.  Laboratory values compared to 2 years ago show mild anemia and renal insufficiency.  Pulse rate was 120 in triage.  Will check INR, type and screen.  Will initiate IV fluid resuscitation, IV Pepcid bolus and infusion.  Will discuss with hospitalist services to evaluate patient in the emergency department for admission.      ____________________________________________   FINAL CLINICAL IMPRESSION(S) / ED DIAGNOSES  Final  diagnoses:  Melena  Rectal bleeding  Renal insufficiency  Dehydration     ED Discharge Orders    None       Note:  This document was prepared using Dragon voice recognition software and may include unintentional dictation errors.    Paulette Blanch, MD 12/23/17 0330

## 2017-12-24 ENCOUNTER — Inpatient Hospital Stay: Payer: Self-pay | Admitting: Anesthesiology

## 2017-12-24 ENCOUNTER — Encounter
Admission: EM | Disposition: A | Discharge status: Home / Self Care | Payer: Self-pay | Source: Home / Self Care | Attending: Internal Medicine

## 2017-12-24 ENCOUNTER — Encounter: Payer: Self-pay | Admitting: *Deleted

## 2017-12-24 DIAGNOSIS — K921 Melena: Secondary | ICD-10-CM

## 2017-12-24 DIAGNOSIS — K2900 Acute gastritis without bleeding: Secondary | ICD-10-CM

## 2017-12-24 DIAGNOSIS — K269 Duodenal ulcer, unspecified as acute or chronic, without hemorrhage or perforation: Secondary | ICD-10-CM

## 2017-12-24 HISTORY — PX: ESOPHAGOGASTRODUODENOSCOPY (EGD) WITH PROPOFOL: SHX5813

## 2017-12-24 LAB — BASIC METABOLIC PANEL
ANION GAP: 5 (ref 5–15)
BUN: 16 mg/dL (ref 8–23)
CHLORIDE: 107 mmol/L (ref 98–111)
CO2: 26 mmol/L (ref 22–32)
Calcium: 8.2 mg/dL — ABNORMAL LOW (ref 8.9–10.3)
Creatinine, Ser: 1.14 mg/dL (ref 0.61–1.24)
GFR calc Af Amer: >60 mL/min (ref >60)
GFR calc non Af Amer: >60 mL/min (ref >60)
GLUCOSE: 121 mg/dL — AB (ref 70–99)
POTASSIUM: 3.7 mmol/L (ref 3.5–5.1)
Sodium: 138 mmol/L (ref 135–145)

## 2017-12-24 LAB — CBC
HEMATOCRIT: 28.2 % — AB (ref 39.0–52.0)
HEMOGLOBIN: 9.1 g/dL — AB (ref 13.0–17.0)
MCH: 28.7 pg (ref 26.0–34.0)
MCHC: 32.3 g/dL (ref 30.0–36.0)
MCV: 89 fL (ref 80.0–100.0)
Platelets: 238 10*3/uL (ref 150–400)
RBC: 3.17 MIL/uL — AB (ref 4.22–5.81)
RDW: 15.1 % (ref 11.5–15.5)
WBC: 9.8 10*3/uL (ref 4.0–10.5)
nRBC: 0 % (ref 0.0–0.2)

## 2017-12-24 LAB — CALCIUM, IONIZED: Calcium, Ionized, Serum: 4.9 mg/dL (ref 4.5–5.6)

## 2017-12-24 LAB — HIV ANTIBODY (ROUTINE TESTING W REFLEX): HIV Screen 4th Generation wRfx: NONREACTIVE

## 2017-12-24 SURGERY — ESOPHAGOGASTRODUODENOSCOPY (EGD) WITH PROPOFOL
Anesthesia: General

## 2017-12-24 MED ORDER — PROPOFOL 10 MG/ML IV BOLUS
INTRAVENOUS | Status: AC
  Filled 2017-12-24: qty 20

## 2017-12-24 MED ORDER — FENTANYL CITRATE (PF) 100 MCG/2ML IJ SOLN
INTRAMUSCULAR | Status: AC
  Filled 2017-12-24: qty 2

## 2017-12-24 MED ORDER — PROPOFOL 10 MG/ML IV BOLUS
INTRAVENOUS | Status: DC | PRN
  Administered 2017-12-24: 20 mg via INTRAVENOUS
  Administered 2017-12-24: 70 mg via INTRAVENOUS
  Administered 2017-12-24 (×2): 30 mg via INTRAVENOUS
  Administered 2017-12-24: 10 mg via INTRAVENOUS

## 2017-12-24 MED ORDER — PROPOFOL 500 MG/50ML IV EMUL
INTRAVENOUS | Status: DC | PRN
  Administered 2017-12-24: 140 ug/kg/min via INTRAVENOUS

## 2017-12-24 MED ORDER — LIDOCAINE HCL (PF) 2 % IJ SOLN
INTRAMUSCULAR | Status: DC | PRN
  Administered 2017-12-24: 100 mg via INTRADERMAL

## 2017-12-24 MED ORDER — PROPOFOL 500 MG/50ML IV EMUL
INTRAVENOUS | Status: AC
  Filled 2017-12-24: qty 50

## 2017-12-24 MED ORDER — FENTANYL CITRATE (PF) 100 MCG/2ML IJ SOLN
INTRAMUSCULAR | Status: DC | PRN
  Administered 2017-12-24: 50 ug via INTRAVENOUS

## 2017-12-24 NOTE — Anesthesia Post-op Follow-up Note (Signed)
Anesthesia QCDR form completed.        

## 2017-12-24 NOTE — Anesthesia Preprocedure Evaluation (Addendum)
Anesthesia Evaluation  Patient identified by MRN, date of birth, ID band Patient awake    Reviewed: Allergy & Precautions, H&P , NPO status , Patient's Chart, lab work & pertinent test results  Airway Mallampati: III  TM Distance: >3 FB Neck ROM: full   Comment: Large head and jaw Dental  (+) Missing, Chipped, Poor Dentition   Pulmonary Current Smoker,    breath sounds clear to auscultation       Cardiovascular hypertension,  Rhythm:regular     Neuro/Psych negative neurological ROS  negative psych ROS   GI/Hepatic Neg liver ROS, PUD,   Endo/Other  negative endocrine ROS  Renal/GU negative Renal ROS  negative genitourinary   Musculoskeletal   Abdominal   Peds  Hematology negative hematology ROS (+)   Anesthesia Other Findings 25 male admitted with GI bleed, melena + hematochezia, generalized weakness, (normocytic) acute blood loss anemia, AKI.  Presenting for EGD.  Reproductive/Obstetrics negative OB ROS                           Anesthesia Physical Anesthesia Plan  ASA: III  Anesthesia Plan: General   Post-op Pain Management:    Induction:   PONV Risk Score and Plan: Propofol infusion and TIVA  Airway Management Planned:   Additional Equipment:   Intra-op Plan:   Post-operative Plan:   Informed Consent: I have reviewed the patients History and Physical, chart, labs and discussed the procedure including the risks, benefits and alternatives for the proposed anesthesia with the patient or authorized representative who has indicated his/her understanding and acceptance.   Dental Advisory Given  Plan Discussed with: Anesthesiologist, CRNA and Surgeon  Anesthesia Plan Comments:         Anesthesia Quick Evaluation

## 2017-12-24 NOTE — Plan of Care (Signed)
The patient is stable. Tolerating diet. Voiding. Passing gas. Pain is under control.  Problem: Education: Goal: Knowledge of General Education information will improve Description Including pain rating scale, medication(s)/side effects and non-pharmacologic comfort measures Outcome: Progressing   Problem: Health Behavior/Discharge Planning: Goal: Ability to manage health-related needs will improve Outcome: Progressing   Problem: Clinical Measurements: Goal: Ability to maintain clinical measurements within normal limits will improve Outcome: Progressing Goal: Will remain free from infection Outcome: Progressing Goal: Diagnostic test results will improve Outcome: Progressing Goal: Respiratory complications will improve Outcome: Progressing Goal: Cardiovascular complication will be avoided Outcome: Progressing   Problem: Activity: Goal: Risk for activity intolerance will decrease Outcome: Progressing   Problem: Nutrition: Goal: Adequate nutrition will be maintained Outcome: Progressing   Problem: Coping: Goal: Level of anxiety will decrease Outcome: Progressing   Problem: Elimination: Goal: Will not experience complications related to bowel motility Outcome: Progressing Goal: Will not experience complications related to urinary retention Outcome: Progressing   Problem: Pain Managment: Goal: General experience of comfort will improve Outcome: Progressing   Problem: Safety: Goal: Ability to remain free from injury will improve Outcome: Progressing   Problem: Skin Integrity: Goal: Risk for impaired skin integrity will decrease Outcome: Progressing

## 2017-12-24 NOTE — Op Note (Signed)
[facility] Gastroenterology Patient Name: Mitchell Robbins Procedure Date: 12/24/2017 11:47 AM MRN: [id number] Account #: [id number] Date of Birth: 07-Feb-1956 Admit Type: Inpatient Age: 62 Room: [facility] ENDO ROOM 4 Gender: Male Note Status: Finalized Procedure:            Upper GI endoscopy Indications:          Melena Providers:            Lucilla Lame MD, MD Referring MD:         Clinic [facility], MD (Referring MD) Medicines:            Propofol per Anesthesia Complications:        No immediate complications. Procedure:            Pre-Anesthesia Assessment:                       - Prior to the procedure, a History and Physical was                        performed, and patient medications and allergies were                        reviewed. The patient's tolerance of previous                        anesthesia was also reviewed. The risks and benefits of                        the procedure and the sedation options and risks were                        discussed with the patient. All questions were                        answered, and informed consent was obtained. Prior                        Anticoagulants: The patient has taken no previous                        anticoagulant or antiplatelet agents. ASA Grade                        Assessment: II - A patient with mild systemic disease.                        After reviewing the risks and benefits, the patient was                        deemed in satisfactory condition to undergo the                        procedure.                       After obtaining informed consent, the endoscope was                        passed under direct vision. Throughout the procedure,  the patient's blood pressure, pulse, and oxygen                        saturations were monitored continuously. The Endoscope                        was introduced through the mouth, and advanced to the              second part of duodenum. The upper GI endoscopy was                        accomplished without difficulty. The patient tolerated                        the procedure well. Findings:      A small hiatal hernia was present.      Localized mild inflammation was found in the gastric antrum.      One non-bleeding cratered duodenal ulcer with pigmented material was       found in the first portion of the duodenum. For hemostasis, three       hemostatic clips were successfully placed (MR conditional). There was no       bleeding at the end of the procedure. Impression:           - Small hiatal hernia.                       - Gastritis.                       - One non-bleeding duodenal ulcer with pigmented                        material. Clips (MR conditional) were placed.                       - No specimens collected. Recommendation:       - Return patient to hospital ward for ongoing care.                       - Clear liquid diet.                       - Continue present medications.                       - Use a proton pump inhibitor PO daily.                       - Repeat upper endoscopy in 3 weeks to check healing. Procedure Code(s):    --- Professional ---                       [phone], Esophagogastroduodenoscopy, flexible, transoral;                        with control of bleeding, any method Diagnosis Code(s):    --- Professional ---                       K92.1, Melena (includes Hematochezia)  K26.9, Duodenal ulcer, unspecified as acute or chronic,                        without hemorrhage or perforation                       K29.70, Gastritis, unspecified, without bleeding CPT copyright 2018 American Medical Association. All rights reserved. The codes documented in this report are preliminary and upon coder review may  be revised to meet current compliance requirements. Lucilla Lame MD, MD 12/24/2017 12:17:59 PM This report has been signed  electronically. Number of Addenda: 0 Note Initiated On: 12/24/2017 11:47 AM      [facility]

## 2017-12-24 NOTE — Transfer of Care (Signed)
Immediate Anesthesia Transfer of Care Note  Patient: Mitchell Robbins  Procedure(s) Performed: ESOPHAGOGASTRODUODENOSCOPY (EGD) WITH PROPOFOL (N/A )  Patient Location: PACU  Anesthesia Type:General  Level of Consciousness: awake  Airway & Oxygen Therapy: Patient Spontanous Breathing and Patient connected to nasal cannula oxygen  Post-op Assessment: Report given to RN and Post -op Vital signs reviewed and stable  Post vital signs: Reviewed and stable  Last Vitals:  Vitals Value Taken Time  BP 133/80 12/24/2017 12:25 PM  Temp 36.1 C 12/24/2017 12:25 PM  Pulse 90 12/24/2017 12:28 PM  Resp 24 12/24/2017 12:28 PM  SpO2 98 % 12/24/2017 12:28 PM  Vitals shown include unvalidated device data.  Last Pain:  Vitals:   12/24/17 1225  TempSrc: Tympanic  PainSc: 0-No pain         Complications: No apparent anesthesia complications

## 2017-12-25 LAB — CBC
HEMATOCRIT: 26.5 % — AB (ref 39.0–52.0)
HEMOGLOBIN: 8.6 g/dL — AB (ref 13.0–17.0)
MCH: 29 pg (ref 26.0–34.0)
MCHC: 32.5 g/dL (ref 30.0–36.0)
MCV: 89.2 fL (ref 80.0–100.0)
NRBC: 0 % (ref 0.0–0.2)
PLATELETS: 228 10*3/uL (ref 150–400)
RBC: 2.97 MIL/uL — AB (ref 4.22–5.81)
RDW: 14.9 % (ref 11.5–15.5)
WBC: 9.7 10*3/uL (ref 4.0–10.5)

## 2017-12-25 LAB — BASIC METABOLIC PANEL
ANION GAP: 9 (ref 5–15)
BUN: 10 mg/dL (ref 8–23)
CO2: 24 mmol/L (ref 22–32)
Calcium: 8.1 mg/dL — ABNORMAL LOW (ref 8.9–10.3)
Chloride: 107 mmol/L (ref 98–111)
Creatinine, Ser: 1 mg/dL (ref 0.61–1.24)
GFR calc Af Amer: >60 mL/min (ref >60)
GLUCOSE: 116 mg/dL — AB (ref 70–99)
POTASSIUM: 3.7 mmol/L (ref 3.5–5.1)
Sodium: 140 mmol/L (ref 135–145)

## 2017-12-25 MED ORDER — BISACODYL 5 MG PO TBEC: 5.0000 mg | DELAYED_RELEASE_TABLET | Freq: Every day | ORAL | 0 refills | Status: DC | PRN

## 2017-12-25 MED ORDER — SENNOSIDES-DOCUSATE SODIUM 8.6-50 MG PO TABS: 1.0000 | ORAL_TABLET | Freq: Every evening | ORAL | 0 refills | Status: DC | PRN

## 2017-12-25 MED ORDER — PANTOPRAZOLE SODIUM 40 MG PO TBEC: 40.0000 mg | DELAYED_RELEASE_TABLET | Freq: Two times a day (BID) | ORAL | 0 refills | Status: DC

## 2017-12-25 NOTE — Progress Notes (Signed)
[place] at [place] NAME: Mitchell Robbins    MR#:  [id number]  DATE OF BIRTH:  1955/07/28  SUBJECTIVE:  CHIEF COMPLAINT:   Chief Complaint  Patient presents with  . Rectal Bleeding   Came with GI bleed. Did not have any bleed so far. 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 Noted to have a nonbleeding ulcer in duodenum and clips placed.  REVIEW OF SYSTEMS:  CONSTITUTIONAL: No fever, fatigue or weakness.  EYES: No blurred or double vision.  EARS, NOSE, AND THROAT: No tinnitus or ear pain.  RESPIRATORY: No cough, shortness of breath, wheezing or hemoptysis.  CARDIOVASCULAR: No chest pain, orthopnea, edema.  GASTROINTESTINAL: No nausea, vomiting, diarrhea or abdominal pain.  GENITOURINARY: No dysuria, hematuria.  ENDOCRINE: No polyuria, nocturia,  HEMATOLOGY: No anemia, easy bruising or bleeding SKIN: No rash or lesion. MUSCULOSKELETAL: No joint pain or arthritis.   NEUROLOGIC: No tingling, numbness, weakness.  PSYCHIATRY: No anxiety or depression.   ROS  DRUG ALLERGIES:  No Known Allergies  VITALS:  Blood pressure (!) 142/86, pulse 77, temperature 98.5 F (36.9 C), temperature source Oral, resp. rate 20, height 5\' 11"  (1.803 m), weight 117.9 kg, SpO2 98 %.  PHYSICAL EXAMINATION:  GENERAL:  62 y.o.-year-old patient lying in the bed with no acute distress.  EYES: Pupils equal, round, reactive to light and accommodation. No scleral icterus. Extraocular muscles intact.  HEENT: Head atraumatic, normocephalic. Oropharynx and nasopharynx clear.  NECK:  Supple, no jugular venous distention. No thyroid enlargement, no tenderness.  LUNGS: Normal breath sounds bilaterally, no wheezing, rales,rhonchi or crepitation. No use of accessory muscles of respiration.  CARDIOVASCULAR: S1, S2 normal. No murmurs, rubs, or gallops.  ABDOMEN: Soft, nontender, nondistended. Bowel sounds present. No organomegaly or mass.  EXTREMITIES: No pedal  edema, cyanosis, or clubbing.  NEUROLOGIC: Cranial nerves II through XII are intact. Muscle strength 5/5 in all extremities. Sensation intact. Gait not checked.  PSYCHIATRIC: The patient is alert and oriented x 3.  SKIN: No obvious rash, lesion, or ulcer.   Physical Exam LABORATORY PANEL:   CBC Recent Labs  Lab 12/25/17 0621  WBC 9.7  HGB 8.6*  HCT 26.5*  PLT 228   ------------------------------------------------------------------------------------------------------------------  Chemistries  Recent Labs  Lab 12/22/17 2053 12/23/17 0434  12/25/17 0621  NA 137  --    < > 140  K 4.0  --    < > 3.7  CL 105  --    < > 107  CO2 22  --    < > 24  GLUCOSE 135*  --    < > 116*  BUN 45*  --    < > 10  CREATININE 1.49*  --    < > 1.00  CALCIUM 8.8*  --    < > 8.1*  MG  --  1.9  --   --   AST 25  --   --   --   ALT 21  --   --   --   ALKPHOS 47  --   --   --   BILITOT 0.7  --   --   --    < > = values in this interval not displayed.   ------------------------------------------------------------------------------------------------------------------  Cardiac Enzymes Recent Labs  Lab 12/23/17 0109  TROPONINI <0.03   ------------------------------------------------------------------------------------------------------------------  RADIOLOGY:  No results found.  ASSESSMENT AND PLAN:   Active Problems:   Melena   Duodenal ulceration  Acute gastritis without hemorrhage  * GI bleed   Duodenal ulcer- s/p clips   IV ppi and sucralfate   Clear liquids       Counseled about dietary habits. No NSAIDs  * Htn   Cont amlodipine.  * Smoking   Counseled to quit smoking for 4 min.    All the records are reviewed and case discussed with Care Management/Social Workerr. Management plans discussed with the patient, family and they are in agreement.  CODE STATUS: Full.  TOTAL TIME TAKING CARE OF THIS PATIENT: 35 minutes.     POSSIBLE D/C IN 1-2 DAYS, DEPENDING ON  CLINICAL CONDITION.   Vaughan Basta M.D on 12/25/2017   Between 7am to 6pm - Pager - [phone]  After 6pm go to [email or web address] - password [email or web address] [facility]  Office  [phone]  CC: Primary care physician; [facility], [facility]  Note: This dictation was prepared with Dragon dictation along with smaller phrase technology. Any transcriptional errors that result from this process are unintentional.

## 2017-12-27 NOTE — Anesthesia Postprocedure Evaluation (Signed)
Anesthesia Post Note  Patient: Mitchell Robbins  Procedure(s) Performed: ESOPHAGOGASTRODUODENOSCOPY (EGD) WITH PROPOFOL (N/A )  Patient location during evaluation: PACU Anesthesia Type: General Level of consciousness: awake and alert Pain management: pain level controlled Vital Signs Assessment: post-procedure vital signs reviewed and stable Respiratory status: spontaneous breathing, nonlabored ventilation, respiratory function stable and patient connected to nasal cannula oxygen Cardiovascular status: blood pressure returned to baseline and stable Postop Assessment: no apparent nausea or vomiting Anesthetic complications: no     Last Vitals:  Vitals:   12/25/17 0510 12/25/17 0953  BP: (!) 142/86 (!) 145/82  Pulse: 77   Resp: 20   Temp: 36.9 C   SpO2: 98%     Last Pain:  Vitals:   12/25/17 0730  TempSrc:   PainSc: 0-No pain                 Durenda Hurt

## 2017-12-30 ENCOUNTER — Telehealth: Payer: Self-pay | Admitting: Gastroenterology

## 2017-12-30 NOTE — Telephone Encounter (Signed)
SPOKE WITH  PT TO INFORM HIM HE WILL NEED TO CONTACT ED FOR WORK NOTE

## 2017-12-30 NOTE — Telephone Encounter (Signed)
Will you contact this pt back and let him know Dr. Allen Norris only did an EGD on him. He will need to contact the ER for a work note. They will provide one for him. Dr. Allen Norris did his procedure because he came to the ER.

## 2017-12-30 NOTE — Telephone Encounter (Signed)
Pt is calling he saw Dr. Allen Norris in [place] and had procedure done and he needs  A note to take a few days of until next week

## 2018-01-03 NOTE — Discharge Summary (Signed)
[place] at [place] NAME: Mitchell Robbins    MR#:  [id number]  DATE OF BIRTH:  [birth year]  DATE OF ADMISSION:  12/23/2017 ADMITTING PHYSICIAN: Arta Silence, MD  DATE OF DISCHARGE: 12/25/2017  1:09 PM  PRIMARY CARE PHYSICIAN: [facility] [place]    ADMISSION DIAGNOSIS:  Dehydration [E86.0] Melena [K92.1] Rectal bleeding [K62.5] Renal insufficiency [N28.9]  DISCHARGE DIAGNOSIS:  Active Problems:   Melena   Duodenal ulceration   Acute gastritis without hemorrhage   SECONDARY DIAGNOSIS:   Past Medical History:  Diagnosis Date  . Hypertension     HOSPITAL COURSE:   * GI bleed   Duodenal ulcer- s/p clips   IV ppi and sucralfate   Clear liquids       Counseled about dietary habits. No NSAIDs  Upgraded soft diet and stable. Follow in clinic in GI in 2-3 weeks.  * Htn   Cont amlodipine.  * Smoking   Counseled to quit smoking for 4 min.  DISCHARGE CONDITIONS:   Stable.  CONSULTS OBTAINED:  Treatment Team:  Arta Silence, MD Jonathon Bellows, MD  DRUG ALLERGIES:  No Known Allergies  DISCHARGE MEDICATIONS:   Allergies as of 12/25/2017   No Known Allergies     Medication List    STOP taking these medications   aspirin EC 81 MG tablet   hydrochlorothiazide 50 MG tablet Commonly known as:  HYDRODIURIL     TAKE these medications   amLODipine 10 MG tablet Commonly known as:  NORVASC 1 BY MOUTH DAILY FOR HIGH BLOOD PRESSURE   bisacodyl 5 MG EC tablet Commonly known as:  DULCOLAX Take 1 tablet (5 mg total) by mouth daily as needed for moderate constipation.   HYDROcodone-acetaminophen 5-325 MG tablet Commonly known as:  NORCO/VICODIN Take 1 tablet by mouth every 6 (six) hours as needed for moderate pain.   lisinopril 20 MG tablet Commonly known as:  PRINIVIL,ZESTRIL Take 20 mg by mouth daily.   pantoprazole 40 MG tablet Commonly known as:  PROTONIX Take 1 tablet (40 mg  total) by mouth 2 (two) times daily before a meal.   senna-docusate 8.6-50 MG tablet Commonly known as:  Senokot-S Take 1 tablet by mouth at bedtime as needed for mild constipation.        DISCHARGE INSTRUCTIONS:    GI clinic in 2-3 weeks to follow.  If you experience worsening of your admission symptoms, develop shortness of breath, life threatening emergency, suicidal or homicidal thoughts you must seek medical attention immediately by calling 911 or calling your MD immediately  if symptoms less severe.  You Must read complete instructions/literature along with all the possible adverse reactions/side effects for all the Medicines you take and that have been prescribed to you. Take any new Medicines after you have completely understood and accept all the possible adverse reactions/side effects.   Please note  You were cared for by a hospitalist during your hospital stay. If you have any questions about your discharge medications or the care you received while you were in the hospital after you are discharged, you can call the unit and asked to speak with the hospitalist on call if the hospitalist that took care of you is not available. Once you are discharged, your primary care physician will handle any further medical issues. Please note that NO REFILLS for any discharge medications will be authorized once you are discharged, as it is imperative that you return to your primary care physician (  or establish a relationship with a primary care physician if you do not have one) for your aftercare needs so that they can reassess your need for medications and monitor your lab values.    Today   CHIEF COMPLAINT:   Chief Complaint  Patient presents with  . Rectal Bleeding    HISTORY OF PRESENT ILLNESS:  Mitchell Robbins  is a 62 y.o. male with a known history of HTN p/w GI bleed, generalized weakness. Pt is stoic and does not say much. He endorses 4-5d Hx dark tarry stools. He states he  passed BRBPR on Tuesday (12/22/2017) morning. This continued throughout the day, 3-4x episodes. He developed generalized weakness. He denies nausea/vomiting. He denies AP at first, but his wife says that he had complained of RLQ pain at some time on Monday (10/14). He is dehydrated. 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 He uses NSAIDs occasionally, but not daily. He smokes 1ppd. He first said he drinks EtOH occasionally, but then said he has been more recently drinking 2-3 beers every night after work. He states his last colonoscopy was > 76yrs ago. He has never had an endoscopy. He does not remember who performed his colonoscopy. He takes a daily ASA81 at home, at the behest of his PCP. He is not on systemic AC. He has not had GI bleeding in the past.   VITAL SIGNS:  Blood pressure (!) 145/82, pulse 77, temperature 98.5 F (36.9 C), temperature source Oral, resp. rate 20, height 5\' 11"  (1.803 m), weight 117.9 kg, SpO2 98 %.  I/O:  No intake or output data in the 24 hours ending 01/03/18 1739  PHYSICAL EXAMINATION:   GENERAL:  62 y.o.-year-old patient lying in the bed with no acute distress.  EYES: Pupils equal, round, reactive to light and accommodation. No scleral icterus. Extraocular muscles intact.  HEENT: Head atraumatic, normocephalic. Oropharynx and nasopharynx clear.  NECK:  Supple, no jugular venous distention. No thyroid enlargement, no tenderness.  LUNGS: Normal breath sounds bilaterally, no wheezing, rales,rhonchi or crepitation. No use of accessory muscles of respiration.  CARDIOVASCULAR: S1, S2 normal. No murmurs, rubs, or gallops.  ABDOMEN: Soft, nontender, nondistended. Bowel sounds present. No organomegaly or mass.  EXTREMITIES: No pedal edema, cyanosis, or clubbing.  NEUROLOGIC: Cranial nerves II through XII are intact. Muscle strength 5/5 in all extremities. Sensation intact. Gait not checked.  PSYCHIATRIC: The patient is alert and oriented x 3.   SKIN: No obvious rash, lesion, or ulcer.    DATA REVIEW:   CBC No results for input(s): WBC, HGB, HCT, PLT in the last 168 hours.  Chemistries  No results for input(s): NA, K, CL, CO2, GLUCOSE, BUN, CREATININE, CALCIUM, MG, AST, ALT, ALKPHOS, BILITOT in the last 168 hours.  Invalid input(s): GFRCGP  Cardiac Enzymes No results for input(s): TROPONINI in the last 168 hours.  Microbiology Results  Results for orders placed or performed during the hospital encounter of 10/01/15  Blood culture (routine x 2)     Status: None   Collection Time: 10/01/15  6:57 AM  Result Value Ref Range Status   Specimen Description BLOOD RIGHT HAND  Final   Special Requests   Final    BOTTLES DRAWN AEROBIC AND ANAEROBIC AER 11ML ANA 10ML   Culture NO GROWTH 5 DAYS  Final   Report Status 10/06/2015 FINAL  Final  Blood culture (routine x 2)     Status: None   Collection Time: 10/01/15  6:57  AM  Result Value Ref Range Status   Specimen Description BLOOD RIGHT AC  Final   Special Requests BOTTLES DRAWN AEROBIC AND ANAEROBIC 8ML  Final   Culture NO GROWTH 5 DAYS  Final   Report Status 10/06/2015 FINAL  Final    RADIOLOGY:  No results found.  EKG:   Orders placed or performed during the hospital encounter of 12/23/17  . ED EKG  . ED EKG  . EKG 12-Lead  . EKG 12-Lead  . EKG      Management plans discussed with the patient, family and they are in agreement.  CODE STATUS:  Code Status History    Date Active Date Inactive Code Status Order ID Comments User Context   12/23/2017 0228 12/25/2017 1614 Full Code [id number]  Arta Silence, MD Inpatient   10/01/2015 0816 10/02/2015 1728 Full Code [id number]  Saundra Shelling, MD Inpatient      TOTAL TIME TAKING CARE OF THIS PATIENT: 35 minutes.    Vaughan Basta M.D on 01/03/2018 at 5:39 PM  Between 7am to 6pm - Pager - [phone]  After 6pm go to [email or web address] - password [email or web address] [facility]  Office   [phone]  CC: Primary care physician; [facility], [facility]   Note: This dictation was prepared with Dragon dictation along with smaller phrase technology. Any transcriptional errors that result from this process are unintentional.

## 2018-01-04 ENCOUNTER — Emergency Department
Admission: EM | Admit: 2018-01-04 | Discharge: 2018-01-04 | Disposition: A | Discharge status: Home / Self Care | Payer: Self-pay | Attending: Emergency Medicine | Admitting: Emergency Medicine

## 2018-01-04 ENCOUNTER — Other Ambulatory Visit: Payer: Self-pay

## 2018-01-04 ENCOUNTER — Emergency Department: Payer: Self-pay

## 2018-01-04 DIAGNOSIS — F17200 Nicotine dependence, unspecified, uncomplicated: Secondary | ICD-10-CM | POA: Insufficient documentation

## 2018-01-04 DIAGNOSIS — I10 Essential (primary) hypertension: Secondary | ICD-10-CM | POA: Insufficient documentation

## 2018-01-04 DIAGNOSIS — L03114 Cellulitis of left upper limb: Secondary | ICD-10-CM | POA: Insufficient documentation

## 2018-01-04 DIAGNOSIS — Z79899 Other long term (current) drug therapy: Secondary | ICD-10-CM | POA: Insufficient documentation

## 2018-01-04 LAB — CBC WITH DIFFERENTIAL/PLATELET
ABS IMMATURE GRANULOCYTES: 0.09 10*3/uL — AB (ref 0.00–0.07)
BASOS ABS: 0.1 10*3/uL (ref 0.0–0.1)
Basophils Relative: 0 %
EOS PCT: 0 %
Eosinophils Absolute: 0.1 10*3/uL (ref 0.0–0.5)
HCT: 35.2 % — ABNORMAL LOW (ref 39.0–52.0)
Hemoglobin: 11.3 g/dL — ABNORMAL LOW (ref 13.0–17.0)
Immature Granulocytes: 1 %
LYMPHS ABS: 4.6 10*3/uL — AB (ref 0.7–4.0)
Lymphocytes Relative: 27 %
MCH: 28 pg (ref 26.0–34.0)
MCHC: 32.1 g/dL (ref 30.0–36.0)
MCV: 87.1 fL (ref 80.0–100.0)
Monocytes Absolute: 1.2 10*3/uL — ABNORMAL HIGH (ref 0.1–1.0)
Monocytes Relative: 7 %
NEUTROS ABS: 11 10*3/uL — AB (ref 1.7–7.7)
NRBC: 0 % (ref 0.0–0.2)
Neutrophils Relative %: 65 %
PLATELETS: 446 10*3/uL — AB (ref 150–400)
RBC: 4.04 MIL/uL — AB (ref 4.22–5.81)
RDW: 15.7 % — ABNORMAL HIGH (ref 11.5–15.5)
WBC: 17.1 10*3/uL — AB (ref 4.0–10.5)

## 2018-01-04 LAB — URINALYSIS, COMPLETE (UACMP) WITH MICROSCOPIC
Bacteria, UA: NONE SEEN
Bilirubin Urine: NEGATIVE
GLUCOSE, UA: NEGATIVE mg/dL
Ketones, ur: NEGATIVE mg/dL
Leukocytes, UA: NEGATIVE
NITRITE: NEGATIVE
PH: 6 (ref 5.0–8.0)
Protein, ur: NEGATIVE mg/dL
Specific Gravity, Urine: 1.013 (ref 1.005–1.030)
Squamous Epithelial / LPF: NONE SEEN (ref 0–5)

## 2018-01-04 LAB — COMPREHENSIVE METABOLIC PANEL
ALBUMIN: 4.4 g/dL (ref 3.5–5.0)
ALK PHOS: 78 U/L (ref 38–126)
ALT: 21 U/L (ref 0–44)
ANION GAP: 11 (ref 5–15)
AST: 15 U/L (ref 15–41)
BILIRUBIN TOTAL: 0.6 mg/dL (ref 0.3–1.2)
BUN: 11 mg/dL (ref 8–23)
CALCIUM: 9.6 mg/dL (ref 8.9–10.3)
CO2: 27 mmol/L (ref 22–32)
Chloride: 99 mmol/L (ref 98–111)
Creatinine, Ser: 1.31 mg/dL — ABNORMAL HIGH (ref 0.61–1.24)
GFR calc Af Amer: >60 mL/min (ref >60)
GFR calc non Af Amer: 56 mL/min — ABNORMAL LOW (ref >60)
GLUCOSE: 139 mg/dL — AB (ref 70–99)
Potassium: 3.5 mmol/L (ref 3.5–5.1)
Sodium: 137 mmol/L (ref 135–145)
TOTAL PROTEIN: 8.5 g/dL — AB (ref 6.5–8.1)

## 2018-01-04 LAB — LACTIC ACID, PLASMA
Lactic Acid, Venous: 1.5 mmol/L (ref 0.5–1.9)
Lactic Acid, Venous: 1.6 mmol/L (ref 0.5–1.9)

## 2018-01-04 MED ORDER — OXYCODONE-ACETAMINOPHEN 5-325 MG PO TABS
1.0000 | ORAL_TABLET | Freq: Once | ORAL | Status: AC
  Administered 2018-01-04: 1 via ORAL
  Filled 2018-01-04: qty 1

## 2018-01-04 MED ORDER — OXYCODONE-ACETAMINOPHEN 5-325 MG PO TABS: 1.0000 | ORAL_TABLET | ORAL | 0 refills | Status: DC | PRN

## 2018-01-04 MED ORDER — SODIUM CHLORIDE 0.9 % IV BOLUS
1000.0000 mL | Freq: Once | INTRAVENOUS | Status: AC
  Administered 2018-01-04: 1000 mL via INTRAVENOUS

## 2018-01-04 MED ORDER — CLINDAMYCIN HCL 150 MG PO CAPS: 450.0000 mg | ORAL_CAPSULE | Freq: Three times a day (TID) | ORAL | 0 refills | Status: AC

## 2018-01-04 MED ORDER — ONDANSETRON 4 MG PO TBDP: 4.0000 mg | ORAL_TABLET | Freq: Three times a day (TID) | ORAL | 0 refills | Status: DC | PRN

## 2018-01-04 MED ORDER — CLINDAMYCIN PHOSPHATE 600 MG/50ML IV SOLN
600.0000 mg | Freq: Once | INTRAVENOUS | Status: AC
  Administered 2018-01-04: 600 mg via INTRAVENOUS
  Filled 2018-01-04: qty 50

## 2018-01-04 NOTE — ED Triage Notes (Signed)
Pt c/o left hand pain since last week.

## 2018-01-04 NOTE — ED Provider Notes (Signed)
[facility] Emergency Department Provider Note  ____________________________________________  Time seen: Approximately 6:17 PM  I have reviewed the triage vital signs and the nursing notes.   HISTORY  Chief Complaint Hand Pain   HPI Mitchell Robbins is a 62 y.o. male with a history of hypertension, peptic ulcer disease who presents for evaluation of left hand pain and swelling x 2 days.  Patient was hospitalized for 2 days and discharged 10 days ago the setting of upper GI bleed.  He denies having an IV on that arm but does endorse having blood drawn from that arm.  For the last 2 days he is noticed swelling and pain of his left hand and also warmth.  No fever or chills, no nausea or vomiting, no dizziness, no chest pain.  In 2017 patient was admitted for cellulitis of the left hand just like today.  No prior history of SVT.  He reports that his pain is mild with no movement but he becomes moderate to severe with movements of the fingers and the wrist, the pain is sharp and constant.  Past Medical History:  Diagnosis Date  . Hypertension     Patient Active Problem List   Diagnosis Date Noted  . Duodenal ulceration   . Acute gastritis without hemorrhage   . Melena 12/23/2017  . Cellulitis 10/01/2015    Past Surgical History:  Procedure Laterality Date  . ESOPHAGOGASTRODUODENOSCOPY (EGD) WITH PROPOFOL N/A 12/24/2017   Procedure: ESOPHAGOGASTRODUODENOSCOPY (EGD) WITH PROPOFOL;  Surgeon: Lucilla Lame, MD;  Location: [facility] ENDOSCOPY;  Service: Endoscopy;  Laterality: N/A;  . THYROIDECTOMY      Prior to Admission medications   Medication Sig Start Date End Date Taking? Authorizing Provider  amLODipine (NORVASC) 10 MG tablet 1 BY MOUTH DAILY FOR HIGH BLOOD PRESSURE 09/21/17   [provider]  bisacodyl (DULCOLAX) 5 MG EC tablet Take 1 tablet (5 mg total) by mouth daily as needed for moderate constipation. 12/25/17   Vaughan Basta, MD    clindamycin (CLEOCIN) 150 MG capsule Take 3 capsules (450 mg total) by mouth 3 (three) times daily for 10 days. 01/04/18 01/14/18  Rudene Re, MD  HYDROcodone-acetaminophen (NORCO/VICODIN) 5-325 MG tablet Take 1 tablet by mouth every 6 (six) hours as needed for moderate pain. 09/30/15   Lisa Roca, MD  lisinopril (PRINIVIL,ZESTRIL) 20 MG tablet Take 20 mg by mouth daily.    [provider]  ondansetron (ZOFRAN ODT) 4 MG disintegrating tablet Take 1 tablet (4 mg total) by mouth every 8 (eight) hours as needed for nausea or vomiting. 01/04/18   Rudene Re, MD  oxyCODONE-acetaminophen (PERCOCET) 5-325 MG tablet Take 1 tablet by mouth every 4 (four) hours as needed for severe pain. 01/04/18   Rudene Re, MD  pantoprazole (PROTONIX) 40 MG tablet Take 1 tablet (40 mg total) by mouth 2 (two) times daily before a meal. 12/25/17 12/25/18  Vaughan Basta, MD  senna-docusate (SENOKOT-S) 8.6-50 MG tablet Take 1 tablet by mouth at bedtime as needed for mild constipation. 12/25/17   Vaughan Basta, MD    Allergies Patient has no known allergies.  Family History  Problem Relation Age of Onset  . Hypertension Mother     Social History Social History   Tobacco Use  . Smoking status: Current Every Day Smoker  . Smokeless tobacco: Never Used  Substance Use Topics  . Alcohol use: No    Comment: quit drinking alcohol long time ago  . Drug use: No    Review  of Systems  Constitutional: Negative for fever. Eyes: Negative for visual changes. ENT: Negative for sore throat. Neck: No neck pain  Cardiovascular: Negative for chest pain. Respiratory: Negative for shortness of breath. Gastrointestinal: Negative for abdominal pain, vomiting or diarrhea. Genitourinary: Negative for dysuria. Musculoskeletal: Negative for back pain. + pain and swelling of the L hand Skin: Negative for rash. Neurological: Negative for headaches, weakness or numbness. Psych: No  SI or HI  ____________________________________________   PHYSICAL EXAM:  VITAL SIGNS: ED Triage Vitals [01/04/18 1149]  Enc Vitals Group     BP (!) 154/92     Pulse Rate (!) 119     Resp 18     Temp 98.8 F (37.1 C)     Temp src      SpO2 95 %     Weight 250 lb (113.4 kg)     Height 5\' 11"  (1.803 m)     Head Circumference      Peak Flow      Pain Score 8     Pain Loc      Pain Edu?      Excl. in [place]?     Constitutional: Alert and oriented. Well appearing and in no apparent distress. HEENT:      Head: Normocephalic and atraumatic.         Eyes: Conjunctivae are normal. Sclera is non-icteric.       Mouth/Throat: Mucous membranes are moist.       Neck: Supple with no signs of meningismus. Cardiovascular: Tachycardic with regular rhythm. No murmurs, gallops, or rubs. 2+ symmetrical distal pulses are present in all extremities. No JVD. Respiratory: Normal respiratory effort. Lungs are clear to auscultation bilaterally. No wheezes, crackles, or rhonchi.  Gastrointestinal: Soft, non tender, and non distended with positive bowel sounds. No rebound or guarding. Musculoskeletal: There is swelling, warmth, and erythema of the dorsum of the L hand, ROM of the wrist is limited due to pain but not significantly reduced.  Neurologic: Normal speech and language. Face is symmetric. Moving all extremities. No gross focal neurologic deficits are appreciated. Skin: Skin is warm, dry and intact. No rash noted. Psychiatric: Mood and affect are normal. Speech and behavior are normal.  ____________________________________________   LABS (all labs ordered are listed, but only abnormal results are displayed)  Labs Reviewed  COMPREHENSIVE METABOLIC PANEL - Abnormal; Notable for the following components:      Result Value   Glucose, Bld 139 (*)    Creatinine, Ser 1.31 (*)    Total Protein 8.5 (*)    GFR calc non Af Amer 56 (*)    All other components within normal limits  CBC WITH  DIFFERENTIAL/PLATELET - Abnormal; Notable for the following components:   WBC 17.1 (*)    RBC 4.04 (*)    Hemoglobin 11.3 (*)    HCT 35.2 (*)    RDW 15.7 (*)    Platelets 446 (*)    Neutro Abs 11.0 (*)    Lymphs Abs 4.6 (*)    Monocytes Absolute 1.2 (*)    Abs Immature Granulocytes 0.09 (*)    All other components within normal limits  LACTIC ACID, PLASMA  LACTIC ACID, PLASMA  URINALYSIS, COMPLETE (UACMP) WITH MICROSCOPIC   ____________________________________________  EKG  none  ____________________________________________  RADIOLOGY  I have personally reviewed the images performed during this visit and I agree with the Radiologist's read.   Interpretation by Radiologist:  US Venous Img Upper Uni Left  Result Date: 01/04/2018  CLINICAL DATA:  Left upper extremity pain and swelling. EXAM: Left UPPER EXTREMITY VENOUS DOPPLER ULTRASOUND TECHNIQUE: Gray-scale sonography with graded compression, as well as color Doppler and duplex ultrasound were performed to evaluate the upper extremity deep venous system from the level of the subclavian vein and including the jugular, axillary, basilic, radial, ulnar and upper cephalic vein. Spectral Doppler was utilized to evaluate flow at rest and with distal augmentation maneuvers. COMPARISON:  Ultrasound of September 30, 2015. FINDINGS: Contralateral Subclavian Vein: Respiratory phasicity is normal and symmetric with the symptomatic side. No evidence of thrombus. Normal compressibility. Internal Jugular Vein: No evidence of thrombus. Normal compressibility, respiratory phasicity and response to augmentation. Subclavian Vein: No evidence of thrombus. Normal compressibility, respiratory phasicity and response to augmentation. Axillary Vein: No evidence of thrombus. Normal compressibility, respiratory phasicity and response to augmentation. Cephalic Vein: No evidence of thrombus. Normal compressibility, respiratory phasicity and response to augmentation.  Basilic Vein: No evidence of thrombus. Normal compressibility, respiratory phasicity and response to augmentation. Brachial Veins: No evidence of thrombus. Normal compressibility, respiratory phasicity and response to augmentation. Radial Veins: No evidence of thrombus. Normal compressibility, respiratory phasicity and response to augmentation. Ulnar Veins: No evidence of thrombus. Normal compressibility, respiratory phasicity and response to augmentation. Venous Reflux:  None visualized. Other Findings:  None visualized. IMPRESSION: No evidence of DVT within the left upper extremity. Electronically Signed   By: Marijo Conception, M.D.   On: 01/04/2018 19:11   Dg Hand Complete Left  Result Date: 01/04/2018 CLINICAL DATA:  62 year old male with a history of left hand swelling EXAM: LEFT HAND - COMPLETE 3+ VIEW COMPARISON:  None. FINDINGS: No acute displaced fracture. Degenerative changes of the interphalangeal joints, predominantly the DIP is. No subluxation/dislocation. No erosive changes. Lateral view demonstrates soft tissue swelling on the dorsum of the hand. No radiopaque foreign body. IMPRESSION: Soft tissue swelling on the dorsum of the hand on the lateral view with no acute bony abnormality. Degenerative changes. Electronically Signed   By: Corrie Mckusick D.O.   On: 01/04/2018 12:32     ____________________________________________   PROCEDURES  Procedure(s) performed: None Procedures Critical Care performed:  None ____________________________________________   INITIAL IMPRESSION / ASSESSMENT AND PLAN / ED COURSE  62 y.o. male with a history of hypertension, peptic ulcer disease who presents for evaluation of left hand pain and swelling x 2 days.  Patient has significant swelling, erythema and warmth of the dorsum of his left hand with limited range of motion however at this time low suspicion for septic joint as patient is still has decent range of motion of the wrist.  Presentation is  concerning for DVT versus cellulitis.  Patient is afebrile.  He is tachycardic with a pulse of 119 and has a white count of 17K.  Lactic acid is pending.  Will start patient on IV clindamycin and IV fluids.  We will send patient for Doppler studies.  Clinical Course as of Jan 04 2057  Mon Jan 04, 2018  2056 Lactic acid x2 remains normal.  Tachycardia has resolved after fluids.  Doppler studies were negative for DVT.  Discussed admission versus outpatient antibiotics with close follow-up and patient has opted for outpatient.  Will provide patient with a prescription for clindamycin and Percocet.  Discussed strict return precautions and close follow-up with primary care doctor in 1 to 2 days for reevaluation   [CV]    Clinical Course User Index [CV] Alfred Levins [place], MD     As part of my  medical decision making, I reviewed the following data within the [place] notes reviewed and incorporated, Labs reviewed , Radiograph reviewed , Notes from prior ED visits and [place] Controlled Substance Database    Pertinent labs & imaging results that were available during my care of the patient were reviewed by me and considered in my medical decision making (see chart for details).    ____________________________________________   FINAL CLINICAL IMPRESSION(S) / ED DIAGNOSES  Final diagnoses:  Cellulitis of left upper extremity      NEW MEDICATIONS STARTED DURING THIS VISIT:  ED Discharge Orders         Ordered    clindamycin (CLEOCIN) 150 MG capsule  3 times daily     01/04/18 2058    oxyCODONE-acetaminophen (PERCOCET) 5-325 MG tablet  Every 4 hours PRN     01/04/18 2058    ondansetron (ZOFRAN ODT) 4 MG disintegrating tablet  Every 8 hours PRN     01/04/18 2058           Note:  This document was prepared using Dragon voice recognition software and may include unintentional dictation errors.    Rudene Re, MD 01/04/18 [phone]

## 2018-01-04 NOTE — Discharge Instructions (Addendum)

## 2018-01-12 ENCOUNTER — Other Ambulatory Visit: Payer: Self-pay

## 2018-01-12 ENCOUNTER — Encounter: Payer: Self-pay | Admitting: Gastroenterology

## 2018-01-12 ENCOUNTER — Ambulatory Visit (INDEPENDENT_AMBULATORY_CARE_PROVIDER_SITE_OTHER): Payer: Self-pay | Admitting: Gastroenterology

## 2018-01-12 VITALS — BP 143/75 | HR 88 | Ht 71.0 in | Wt 262.4 lb

## 2018-01-12 DIAGNOSIS — K269 Duodenal ulcer, unspecified as acute or chronic, without hemorrhage or perforation: Secondary | ICD-10-CM

## 2018-01-12 NOTE — H&P (View-Only) (Signed)
Primary Care Physician: [facility]  Primary Gastroenterologist:  Dr. Lucilla Lame  Chief Complaint  Patient presents with  . follow up ER    HPI: Mitchell Robbins is a 62 y.o. male here for follow-up after being discharged from the hospital.  The patient was admitted to the hospital with melena.  The patient underwent an upper endoscopy with duodenal ulcer with active bleeding.  The patient had 2 clips placed on the bleeding site at the time of the upper endoscopy.  The patient denies any further GI bleeding black stools or bloody stools.  He also states that he has stopped taking any anti-inflammatory medication.  Current Outpatient Medications  Medication Sig Dispense Refill  . clindamycin (CLEOCIN) 150 MG capsule Take 3 capsules (450 mg total) by mouth 3 (three) times daily for 10 days. 90 capsule 0  . hydrochlorothiazide (HYDRODIURIL) 50 MG tablet Take by mouth.    Marland Kitchen lisinopril (PRINIVIL,ZESTRIL) 20 MG tablet Take 20 mg by mouth daily.    Marland Kitchen amLODipine (NORVASC) 10 MG tablet 1 BY MOUTH DAILY FOR HIGH BLOOD PRESSURE  1  . aspirin 81 MG tablet 1 BY MOUTH DAILY FOR HEART PROTECTION  1  . bisacodyl (DULCOLAX) 5 MG EC tablet Take 1 tablet (5 mg total) by mouth daily as needed for moderate constipation. (Patient not taking: Reported on 01/12/2018) 30 tablet 0  . HYDROcodone-acetaminophen (NORCO/VICODIN) 5-325 MG tablet Take 1 tablet by mouth every 6 (six) hours as needed for moderate pain. (Patient not taking: Reported on 01/12/2018) 10 tablet 0  . ondansetron (ZOFRAN ODT) 4 MG disintegrating tablet Take 1 tablet (4 mg total) by mouth every 8 (eight) hours as needed for nausea or vomiting. (Patient not taking: Reported on 01/12/2018) 20 tablet 0  . oxyCODONE-acetaminophen (PERCOCET) 5-325 MG tablet Take 1 tablet by mouth every 4 (four) hours as needed for severe pain. (Patient not taking: Reported on 01/12/2018) 8 tablet 0  . pantoprazole (PROTONIX) 40 MG tablet Take 1 tablet (40  mg total) by mouth 2 (two) times daily before a meal. (Patient not taking: Reported on 01/12/2018) 60 tablet 0  . senna-docusate (SENOKOT-S) 8.6-50 MG tablet Take 1 tablet by mouth at bedtime as needed for mild constipation. (Patient not taking: Reported on 01/12/2018) 20 tablet 0   No current facility-administered medications for this visit.     Allergies as of 01/12/2018  . (No Known Allergies)    ROS:  General: Negative for anorexia, weight loss, fever, chills, fatigue, weakness. ENT: Negative for hoarseness, difficulty swallowing , nasal congestion. CV: Negative for chest pain, angina, palpitations, dyspnea on exertion, peripheral edema.  Respiratory: Negative for dyspnea at rest, dyspnea on exertion, cough, sputum, wheezing.  GI: See history of present illness. GU:  Negative for dysuria, hematuria, urinary incontinence, urinary frequency, nocturnal urination.  Endo: Negative for unusual weight change.    Physical Examination:   BP (!) 143/75   Pulse 88   Ht 5\' 11"  (1.803 m)   Wt 262 lb 6.4 oz (119 kg)   BMI 36.60 kg/m   General: Well-nourished, well-developed in no acute distress.  Eyes: No icterus. Conjunctivae pink. Mouth: Oropharyngeal mucosa moist and pink , no lesions erythema or exudate. Extremities: No lower extremity edema. No clubbing or deformities. Neuro: Alert and oriented x 3.  Grossly intact. Skin: Warm and dry, no jaundice.   Psych: Alert and cooperative, normal mood and affect.  Labs:    Imaging Studies: US Venous Img Upper Uni Left  Result Date: 01/04/2018 CLINICAL DATA:  Left upper extremity pain and swelling. EXAM: Left UPPER EXTREMITY VENOUS DOPPLER ULTRASOUND TECHNIQUE: Gray-scale sonography with graded compression, as well as color Doppler and duplex ultrasound were performed to evaluate the upper extremity deep venous system from the level of the subclavian vein and including the jugular, axillary, basilic, radial, ulnar and upper cephalic vein.  Spectral Doppler was utilized to evaluate flow at rest and with distal augmentation maneuvers. COMPARISON:  Ultrasound of September 30, 2015. FINDINGS: Contralateral Subclavian Vein: Respiratory phasicity is normal and symmetric with the symptomatic side. No evidence of thrombus. Normal compressibility. Internal Jugular Vein: No evidence of thrombus. Normal compressibility, respiratory phasicity and response to augmentation. Subclavian Vein: No evidence of thrombus. Normal compressibility, respiratory phasicity and response to augmentation. Axillary Vein: No evidence of thrombus. Normal compressibility, respiratory phasicity and response to augmentation. Cephalic Vein: No evidence of thrombus. Normal compressibility, respiratory phasicity and response to augmentation. Basilic Vein: No evidence of thrombus. Normal compressibility, respiratory phasicity and response to augmentation. Brachial Veins: No evidence of thrombus. Normal compressibility, respiratory phasicity and response to augmentation. Radial Veins: No evidence of thrombus. Normal compressibility, respiratory phasicity and response to augmentation. Ulnar Veins: No evidence of thrombus. Normal compressibility, respiratory phasicity and response to augmentation. Venous Reflux:  None visualized. Other Findings:  None visualized. IMPRESSION: No evidence of DVT within the left upper extremity. Electronically Signed   By: Marijo Conception, M.D.   On: 01/04/2018 19:11   Dg Chest Port 1 View  Result Date: 12/23/2017 CLINICAL DATA:  Dark tarry stools, short of breath EXAM: PORTABLE CHEST 1 VIEW COMPARISON:  11/29/2014 FINDINGS: No focal opacity or pleural effusion. Borderline cardiomegaly. No pneumothorax. No free air beneath the diaphragms. IMPRESSION: No active disease. Electronically Signed   By: Donavan Foil M.D.   On: 12/23/2017 01:03   Dg Hand Complete Left  Result Date: 01/04/2018 CLINICAL DATA:  62 year old male with a history of left hand swelling  EXAM: LEFT HAND - COMPLETE 3+ VIEW COMPARISON:  None. FINDINGS: No acute displaced fracture. Degenerative changes of the interphalangeal joints, predominantly the DIP is. No subluxation/dislocation. No erosive changes. Lateral view demonstrates soft tissue swelling on the dorsum of the hand. No radiopaque foreign body. IMPRESSION: Soft tissue swelling on the dorsum of the hand on the lateral view with no acute bony abnormality. Degenerative changes. Electronically Signed   By: Corrie Mckusick D.O.   On: 01/04/2018 12:32    Assessment and Plan:   Mitchell Robbins is a 62 y.o. y/o male who had a duodenal ulcer that was treated with 2 endoclips.  The patient was then discharged from the hospital and is here for follow-up.  The patient should undergo another EGD to look for the duodenal ulcer and biopsy the ulcer if it is not healing.  The texture of the ulcer at the time of the procedure appeared a bit more firm than would be expected from a duodenal ulcer therefore reevaluation is warranted.  The patient is also due for a screening colonoscopy.  He will be set up for an EGD and colonoscopy. I have discussed risks & benefits which include, but are not limited to, bleeding, infection, perforation & drug reaction.  The patient agrees with this plan & written consent will be obtained.       Lucilla Lame, MD. Marval Regal   Note: This dictation was prepared with Dragon dictation along with smaller phrase technology. Any transcriptional errors that result from this process are unintentional.

## 2018-01-12 NOTE — Progress Notes (Signed)
Primary Care Physician: [facility]  Primary Gastroenterologist:  Dr. Lucilla Lame  Chief Complaint  Patient presents with  . follow up ER    HPI: Atthew Coutant is a 62 y.o. male here for follow-up after being discharged from the hospital.  The patient was admitted to the hospital with melena.  The patient underwent an upper endoscopy with duodenal ulcer with active bleeding.  The patient had 2 clips placed on the bleeding site at the time of the upper endoscopy.  The patient denies any further GI bleeding black stools or bloody stools.  He also states that he has stopped taking any anti-inflammatory medication.  Current Outpatient Medications  Medication Sig Dispense Refill  . clindamycin (CLEOCIN) 150 MG capsule Take 3 capsules (450 mg total) by mouth 3 (three) times daily for 10 days. 90 capsule 0  . hydrochlorothiazide (HYDRODIURIL) 50 MG tablet Take by mouth.    Marland Kitchen lisinopril (PRINIVIL,ZESTRIL) 20 MG tablet Take 20 mg by mouth daily.    Marland Kitchen amLODipine (NORVASC) 10 MG tablet 1 BY MOUTH DAILY FOR HIGH BLOOD PRESSURE  1  . aspirin 81 MG tablet 1 BY MOUTH DAILY FOR HEART PROTECTION  1  . bisacodyl (DULCOLAX) 5 MG EC tablet Take 1 tablet (5 mg total) by mouth daily as needed for moderate constipation. (Patient not taking: Reported on 01/12/2018) 30 tablet 0  . HYDROcodone-acetaminophen (NORCO/VICODIN) 5-325 MG tablet Take 1 tablet by mouth every 6 (six) hours as needed for moderate pain. (Patient not taking: Reported on 01/12/2018) 10 tablet 0  . ondansetron (ZOFRAN ODT) 4 MG disintegrating tablet Take 1 tablet (4 mg total) by mouth every 8 (eight) hours as needed for nausea or vomiting. (Patient not taking: Reported on 01/12/2018) 20 tablet 0  . oxyCODONE-acetaminophen (PERCOCET) 5-325 MG tablet Take 1 tablet by mouth every 4 (four) hours as needed for severe pain. (Patient not taking: Reported on 01/12/2018) 8 tablet 0  . pantoprazole (PROTONIX) 40 MG tablet Take 1 tablet (40  mg total) by mouth 2 (two) times daily before a meal. (Patient not taking: Reported on 01/12/2018) 60 tablet 0  . senna-docusate (SENOKOT-S) 8.6-50 MG tablet Take 1 tablet by mouth at bedtime as needed for mild constipation. (Patient not taking: Reported on 01/12/2018) 20 tablet 0   No current facility-administered medications for this visit.     Allergies as of 01/12/2018  . (No Known Allergies)    ROS:  General: Negative for anorexia, weight loss, fever, chills, fatigue, weakness. ENT: Negative for hoarseness, difficulty swallowing , nasal congestion. CV: Negative for chest pain, angina, palpitations, dyspnea on exertion, peripheral edema.  Respiratory: Negative for dyspnea at rest, dyspnea on exertion, cough, sputum, wheezing.  GI: See history of present illness. GU:  Negative for dysuria, hematuria, urinary incontinence, urinary frequency, nocturnal urination.  Endo: Negative for unusual weight change.    Physical Examination:   BP (!) 143/75   Pulse 88   Ht 5\' 11"  (1.803 m)   Wt 262 lb 6.4 oz (119 kg)   BMI 36.60 kg/m   General: Well-nourished, well-developed in no acute distress.  Eyes: No icterus. Conjunctivae pink. Mouth: Oropharyngeal mucosa moist and pink , no lesions erythema or exudate. Extremities: No lower extremity edema. No clubbing or deformities. Neuro: Alert and oriented x 3.  Grossly intact. Skin: Warm and dry, no jaundice.   Psych: Alert and cooperative, normal mood and affect.  Labs:    Imaging Studies: US Venous Img Upper Uni Left  Result Date: 01/04/2018 CLINICAL DATA:  Left upper extremity pain and swelling. EXAM: Left UPPER EXTREMITY VENOUS DOPPLER ULTRASOUND TECHNIQUE: Gray-scale sonography with graded compression, as well as color Doppler and duplex ultrasound were performed to evaluate the upper extremity deep venous system from the level of the subclavian vein and including the jugular, axillary, basilic, radial, ulnar and upper cephalic vein.  Spectral Doppler was utilized to evaluate flow at rest and with distal augmentation maneuvers. COMPARISON:  Ultrasound of September 30, 2015. FINDINGS: Contralateral Subclavian Vein: Respiratory phasicity is normal and symmetric with the symptomatic side. No evidence of thrombus. Normal compressibility. Internal Jugular Vein: No evidence of thrombus. Normal compressibility, respiratory phasicity and response to augmentation. Subclavian Vein: No evidence of thrombus. Normal compressibility, respiratory phasicity and response to augmentation. Axillary Vein: No evidence of thrombus. Normal compressibility, respiratory phasicity and response to augmentation. Cephalic Vein: No evidence of thrombus. Normal compressibility, respiratory phasicity and response to augmentation. Basilic Vein: No evidence of thrombus. Normal compressibility, respiratory phasicity and response to augmentation. Brachial Veins: No evidence of thrombus. Normal compressibility, respiratory phasicity and response to augmentation. Radial Veins: No evidence of thrombus. Normal compressibility, respiratory phasicity and response to augmentation. Ulnar Veins: No evidence of thrombus. Normal compressibility, respiratory phasicity and response to augmentation. Venous Reflux:  None visualized. Other Findings:  None visualized. IMPRESSION: No evidence of DVT within the left upper extremity. Electronically Signed   By: Marijo Conception, M.D.   On: 01/04/2018 19:11   Dg Chest Port 1 View  Result Date: 12/23/2017 CLINICAL DATA:  Dark tarry stools, short of breath EXAM: PORTABLE CHEST 1 VIEW COMPARISON:  11/29/2014 FINDINGS: No focal opacity or pleural effusion. Borderline cardiomegaly. No pneumothorax. No free air beneath the diaphragms. IMPRESSION: No active disease. Electronically Signed   By: Donavan Foil M.D.   On: 12/23/2017 01:03   Dg Hand Complete Left  Result Date: 01/04/2018 CLINICAL DATA:  62 year old male with a history of left hand swelling  EXAM: LEFT HAND - COMPLETE 3+ VIEW COMPARISON:  None. FINDINGS: No acute displaced fracture. Degenerative changes of the interphalangeal joints, predominantly the DIP is. No subluxation/dislocation. No erosive changes. Lateral view demonstrates soft tissue swelling on the dorsum of the hand. No radiopaque foreign body. IMPRESSION: Soft tissue swelling on the dorsum of the hand on the lateral view with no acute bony abnormality. Degenerative changes. Electronically Signed   By: Corrie Mckusick D.O.   On: 01/04/2018 12:32    Assessment and Plan:   Mitchell Robbins is a 62 y.o. y/o male who had a duodenal ulcer that was treated with 2 endoclips.  The patient was then discharged from the hospital and is here for follow-up.  The patient should undergo another EGD to look for the duodenal ulcer and biopsy the ulcer if it is not healing.  The texture of the ulcer at the time of the procedure appeared a bit more firm than would be expected from a duodenal ulcer therefore reevaluation is warranted.  The patient is also due for a screening colonoscopy.  He will be set up for an EGD and colonoscopy. I have discussed risks & benefits which include, but are not limited to, bleeding, infection, perforation & drug reaction.  The patient agrees with this plan & written consent will be obtained.       Lucilla Lame, MD. Marval Regal   Note: This dictation was prepared with Dragon dictation along with smaller phrase technology. Any transcriptional errors that result from this process are unintentional.

## 2018-01-15 ENCOUNTER — Other Ambulatory Visit: Payer: Self-pay

## 2018-01-15 DIAGNOSIS — K269 Duodenal ulcer, unspecified as acute or chronic, without hemorrhage or perforation: Secondary | ICD-10-CM

## 2018-01-18 ENCOUNTER — Encounter: Payer: Self-pay | Admitting: *Deleted

## 2018-01-18 ENCOUNTER — Encounter: Payer: Self-pay | Admitting: Emergency Medicine

## 2018-01-18 ENCOUNTER — Emergency Department
Admission: EM | Admit: 2018-01-18 | Discharge: 2018-01-18 | Disposition: A | Discharge status: Home / Self Care | Payer: Self-pay | Attending: Emergency Medicine | Admitting: Emergency Medicine

## 2018-01-18 ENCOUNTER — Other Ambulatory Visit: Payer: Self-pay

## 2018-01-18 DIAGNOSIS — F17200 Nicotine dependence, unspecified, uncomplicated: Secondary | ICD-10-CM | POA: Insufficient documentation

## 2018-01-18 DIAGNOSIS — R2232 Localized swelling, mass and lump, left upper limb: Secondary | ICD-10-CM | POA: Insufficient documentation

## 2018-01-18 DIAGNOSIS — I10 Essential (primary) hypertension: Secondary | ICD-10-CM | POA: Insufficient documentation

## 2018-01-18 DIAGNOSIS — M7989 Other specified soft tissue disorders: Secondary | ICD-10-CM

## 2018-01-18 LAB — CBC WITH DIFFERENTIAL/PLATELET
Abs Immature Granulocytes: 0.04 10*3/uL (ref 0.00–0.07)
Basophils Absolute: 0.1 10*3/uL (ref 0.0–0.1)
Basophils Relative: 1 %
EOS ABS: 0.2 10*3/uL (ref 0.0–0.5)
EOS PCT: 1 %
HEMATOCRIT: 32.3 % — AB (ref 39.0–52.0)
Hemoglobin: 10.1 g/dL — ABNORMAL LOW (ref 13.0–17.0)
Immature Granulocytes: 0 %
Lymphocytes Relative: 36 %
Lymphs Abs: 4.1 10*3/uL — ABNORMAL HIGH (ref 0.7–4.0)
MCH: 26.8 pg (ref 26.0–34.0)
MCHC: 31.3 g/dL (ref 30.0–36.0)
MCV: 85.7 fL (ref 80.0–100.0)
MONOS PCT: 6 %
Monocytes Absolute: 0.6 10*3/uL (ref 0.1–1.0)
Neutro Abs: 6.2 10*3/uL (ref 1.7–7.7)
Neutrophils Relative %: 56 %
Platelets: 458 10*3/uL — ABNORMAL HIGH (ref 150–400)
RBC: 3.77 MIL/uL — ABNORMAL LOW (ref 4.22–5.81)
RDW: 15.9 % — AB (ref 11.5–15.5)
WBC: 11.2 10*3/uL — ABNORMAL HIGH (ref 4.0–10.5)
nRBC: 0 % (ref 0.0–0.2)

## 2018-01-18 LAB — URIC ACID: Uric Acid, Serum: 7.5 mg/dL (ref 3.7–8.6)

## 2018-01-18 LAB — COMPREHENSIVE METABOLIC PANEL
ALBUMIN: 3.7 g/dL (ref 3.5–5.0)
ALK PHOS: 69 U/L (ref 38–126)
ALT: 20 U/L (ref 0–44)
AST: 16 U/L (ref 15–41)
Anion gap: 10 (ref 5–15)
BUN: 10 mg/dL (ref 8–23)
CALCIUM: 8.8 mg/dL — AB (ref 8.9–10.3)
CO2: 26 mmol/L (ref 22–32)
CREATININE: 1.1 mg/dL (ref 0.61–1.24)
Chloride: 102 mmol/L (ref 98–111)
GFR calc non Af Amer: >60 mL/min (ref >60)
GLUCOSE: 110 mg/dL — AB (ref 70–99)
Potassium: 3.9 mmol/L (ref 3.5–5.1)
Sodium: 138 mmol/L (ref 135–145)
Total Bilirubin: 0.4 mg/dL (ref 0.3–1.2)
Total Protein: 7.5 g/dL (ref 6.5–8.1)

## 2018-01-18 LAB — LACTIC ACID, PLASMA: Lactic Acid, Venous: 1.1 mmol/L (ref 0.5–1.9)

## 2018-01-18 MED ORDER — PREDNISONE 10 MG (21) PO TBPK: ORAL_TABLET | ORAL | 0 refills | Status: DC

## 2018-01-18 NOTE — ED Provider Notes (Signed)
[facility] Emergency Department Provider Note       Time seen: ----------------------------------------- 9:16 AM on 01/18/2018 -----------------------------------------   I have reviewed the triage vital signs and the nursing notes.  HISTORY   Chief Complaint Hand Problem    HPI Mitchell Robbins is a 62 y.o. male with a history of hypertension, cellulitis, who presents to the ED for left hand swelling.  Patient was recently seen for cellulitis for the left upper extremity 2 weeks ago.  He finished antibiotics but reports he is not feeling any better.  Patient is not having any significant pain or erythema.  He reports he did use ice and an Ace wrap with some improvement in his swelling.  Past Medical History:  Diagnosis Date  . Hypertension     Patient Active Problem List   Diagnosis Date Noted  . Duodenal ulceration   . Acute gastritis without hemorrhage   . Melena 12/23/2017  . Cellulitis 10/01/2015  . Hypertension 01/29/2012    Past Surgical History:  Procedure Laterality Date  . ESOPHAGOGASTRODUODENOSCOPY (EGD) WITH PROPOFOL N/A 12/24/2017   Procedure: ESOPHAGOGASTRODUODENOSCOPY (EGD) WITH PROPOFOL;  Surgeon: Lucilla Lame, MD;  Location: [facility] ENDOSCOPY;  Service: Endoscopy;  Laterality: N/A;  . THYROIDECTOMY      Allergies Patient has no known allergies.  Social History Social History   Tobacco Use  . Smoking status: Current Every Day Smoker  . Smokeless tobacco: Never Used  Substance Use Topics  . Alcohol use: No    Comment: quit drinking alcohol long time ago  . Drug use: No   Review of Systems Constitutional: Negative for fever. Cardiovascular: Negative for chest pain. Respiratory: Negative for shortness of breath. Gastrointestinal: Negative for abdominal pain, vomiting and diarrhea. Musculoskeletal: Positive for left hand swelling Skin: Negative for rash. Neurological: Negative for headaches, focal weakness or  numbness.  All systems negative/normal/unremarkable except as stated in the HPI  ____________________________________________   PHYSICAL EXAM:  VITAL SIGNS: ED Triage Vitals  Enc Vitals Group     BP 01/18/18 0846 (!) 172/76     Pulse Rate 01/18/18 0846 99     Resp 01/18/18 0846 18     Temp 01/18/18 0846 98.8 F (37.1 C)     Temp Source 01/18/18 0846 Oral     SpO2 01/18/18 0846 98 %     Weight 01/18/18 0848 265 lb 5 oz (120.3 kg)     Height 01/18/18 0850 5\' 11"  (1.803 m)     Head Circumference --      Peak Flow --      Pain Score 01/18/18 0850 0     Pain Loc --      Pain Edu? --      Excl. in [place]? --    Constitutional: Alert and oriented. Well appearing and in no distress. Cardiovascular: Normal rate, regular rhythm. No murmurs, rubs, or gallops. Respiratory: Normal respiratory effort without tachypnea nor retractions. Breath sounds are clear and equal bilaterally. No wheezes/rales/rhonchi. Gastrointestinal: Soft and nontender. Normal bowel sounds Musculoskeletal: No tenderness is noted but there is diffuse left hand swelling that extends to the wrist, also without erythema Neurologic:  Normal speech and language. No gross focal neurologic deficits are appreciated.  Skin:  Skin is warm, dry and intact. No rash noted. ____________________________________________  ED COURSE:  As part of my medical decision making, I reviewed the following data within the [place] History obtained from family if available, nursing notes, old chart and ekg, as  well as notes from prior ED visits. Patient presented for left hand swelling, we will assess with labs and imaging as indicated at this time.   Procedures ____________________________________________   LABS (pertinent positives/negatives)  Labs Reviewed  COMPREHENSIVE METABOLIC PANEL - Abnormal; Notable for the following components:      Result Value   Glucose, Bld 110 (*)    Calcium 8.8 (*)    All other  components within normal limits  CBC WITH DIFFERENTIAL/PLATELET - Abnormal; Notable for the following components:   WBC 11.2 (*)    RBC 3.77 (*)    Hemoglobin 10.1 (*)    HCT 32.3 (*)    RDW 15.9 (*)    Platelets 458 (*)    Lymphs Abs 4.1 (*)    All other components within normal limits  LACTIC ACID, PLASMA  URIC ACID    RADIOLOGY  2 weeks ago the left hand x-rays and ultrasound of the left arm was negative  ____________________________________________  DIFFERENTIAL DIAGNOSIS   Gout, arthritis, cellulitis, phlebitis  FINAL ASSESSMENT AND PLAN  Left hand swelling   Plan: The patient had presented for left hand swelling which to me resembles more gout than any other etiology. Patient's labs revealed minimal leukocytosis but otherwise no acute process.  Clinically I think this appears to be arthritis or gout rather than any infectious etiology.  He is not having any tenderness or pain with range of motion.  I think steroids would help and I will encourage close outpatient follow-up.Laurence Aly, MD   Note: This note was generated in part or whole with voice recognition software. Voice recognition is usually quite accurate but there are transcription errors that can and very often do occur. I apologize for any typographical errors that were not detected and corrected.     Earleen Newport, MD 01/18/18 1030

## 2018-01-18 NOTE — ED Notes (Signed)
Patient ambulatory to Rm 4, Janett Billow RN aware of placement.

## 2018-01-18 NOTE — ED Triage Notes (Signed)
Dx here with cellulitis of LUE 2 weeks ago. Finished abx but reports not any better.  Left hand has significant swelling. Denies fever.  Has intermittent pain.  VSS.

## 2018-01-20 NOTE — Discharge Instructions (Signed)
General Anesthesia, Adult, Care After °These instructions provide you with information about caring for yourself after your procedure. Your health care provider may also give you more specific instructions. Your treatment has been planned according to current medical practices, but problems sometimes occur. Call your health care provider if you have any problems or questions after your procedure. °What can I expect after the procedure? °After the procedure, it is common to have: °· Vomiting. °· A sore throat. °· Mental slowness. ° °It is common to feel: °· Nauseous. °· Cold or shivery. °· Sleepy. °· Tired. °· Sore or achy, even in parts of your body where you did not have surgery. ° °Follow these instructions at home: °For at least 24 hours after the procedure: °· Do not: °? Participate in activities where you could fall or become injured. °? Drive. °? Use heavy machinery. °? Drink alcohol. °? Take sleeping pills or medicines that cause drowsiness. °? Make important decisions or sign legal documents. °? Take care of children on your own. °· Rest. °Eating and drinking °· If you vomit, drink water, juice, or soup when you can drink without vomiting. °· Drink enough fluid to keep your urine clear or pale yellow. °· Make sure you have little or no nausea before eating solid foods. °· Follow the diet recommended by your health care provider. °General instructions °· Have a responsible adult stay with you until you are awake and alert. °· Return to your normal activities as told by your health care provider. Ask your health care provider what activities are safe for you. °· Take over-the-counter and prescription medicines only as told by your health care provider. °· If you smoke, do not smoke without supervision. °· Keep all follow-up visits as told by your health care provider. This is important. °Contact a health care provider if: °· You continue to have nausea or vomiting at home, and medicines are not helpful. °· You  cannot drink fluids or start eating again. °· You cannot urinate after 8-12 hours. °· You develop a skin rash. °· You have fever. °· You have increasing redness at the site of your procedure. °Get help right away if: °· You have difficulty breathing. °· You have chest pain. °· You have unexpected bleeding. °· You feel that you are having a life-threatening or urgent problem. °This information is not intended to replace advice given to you by your health care provider. Make sure you discuss any questions you have with your health care provider. °Document Released: 06/02/2000 Document Revised: 07/30/2015 Document Reviewed: 02/08/2015 °Elsevier Interactive Patient Education © 2018 [employer] ° °

## 2018-01-21 ENCOUNTER — Ambulatory Visit: Payer: Self-pay | Admitting: Anesthesiology

## 2018-01-21 ENCOUNTER — Ambulatory Visit
Admission: RE | Admit: 2018-01-21 | Discharge: 2018-01-21 | Disposition: A | Discharge status: Home / Self Care | Payer: Self-pay | Source: Ambulatory Visit | Attending: Gastroenterology | Admitting: Gastroenterology

## 2018-01-21 ENCOUNTER — Encounter
Admission: RE | Disposition: A | Discharge status: Home / Self Care | Payer: Self-pay | Source: Ambulatory Visit | Attending: Gastroenterology

## 2018-01-21 DIAGNOSIS — I10 Essential (primary) hypertension: Secondary | ICD-10-CM | POA: Insufficient documentation

## 2018-01-21 DIAGNOSIS — D124 Benign neoplasm of descending colon: Secondary | ICD-10-CM | POA: Insufficient documentation

## 2018-01-21 DIAGNOSIS — Z7982 Long term (current) use of aspirin: Secondary | ICD-10-CM | POA: Insufficient documentation

## 2018-01-21 DIAGNOSIS — D122 Benign neoplasm of ascending colon: Secondary | ICD-10-CM | POA: Insufficient documentation

## 2018-01-21 DIAGNOSIS — Z8711 Personal history of peptic ulcer disease: Secondary | ICD-10-CM

## 2018-01-21 DIAGNOSIS — D3A8 Other benign neuroendocrine tumors: Secondary | ICD-10-CM | POA: Insufficient documentation

## 2018-01-21 DIAGNOSIS — D125 Benign neoplasm of sigmoid colon: Secondary | ICD-10-CM | POA: Insufficient documentation

## 2018-01-21 DIAGNOSIS — D120 Benign neoplasm of cecum: Secondary | ICD-10-CM

## 2018-01-21 DIAGNOSIS — Z79899 Other long term (current) drug therapy: Secondary | ICD-10-CM | POA: Insufficient documentation

## 2018-01-21 DIAGNOSIS — F17200 Nicotine dependence, unspecified, uncomplicated: Secondary | ICD-10-CM | POA: Insufficient documentation

## 2018-01-21 DIAGNOSIS — K269 Duodenal ulcer, unspecified as acute or chronic, without hemorrhage or perforation: Secondary | ICD-10-CM

## 2018-01-21 DIAGNOSIS — K279 Peptic ulcer, site unspecified, unspecified as acute or chronic, without hemorrhage or perforation: Secondary | ICD-10-CM

## 2018-01-21 DIAGNOSIS — K621 Rectal polyp: Secondary | ICD-10-CM | POA: Insufficient documentation

## 2018-01-21 DIAGNOSIS — K635 Polyp of colon: Secondary | ICD-10-CM

## 2018-01-21 DIAGNOSIS — D123 Benign neoplasm of transverse colon: Secondary | ICD-10-CM

## 2018-01-21 DIAGNOSIS — Z09 Encounter for follow-up examination after completed treatment for conditions other than malignant neoplasm: Secondary | ICD-10-CM | POA: Insufficient documentation

## 2018-01-21 DIAGNOSIS — K317 Polyp of stomach and duodenum: Secondary | ICD-10-CM

## 2018-01-21 DIAGNOSIS — Z1211 Encounter for screening for malignant neoplasm of colon: Secondary | ICD-10-CM

## 2018-01-21 HISTORY — PX: COLONOSCOPY WITH PROPOFOL: SHX5780

## 2018-01-21 HISTORY — PX: POLYPECTOMY: SHX5525

## 2018-01-21 HISTORY — PX: ESOPHAGOGASTRODUODENOSCOPY (EGD) WITH PROPOFOL: SHX5813

## 2018-01-21 SURGERY — COLONOSCOPY WITH PROPOFOL
Anesthesia: General | Site: Throat

## 2018-01-21 MED ORDER — PROPOFOL 10 MG/ML IV BOLUS
INTRAVENOUS | Status: DC | PRN
  Administered 2018-01-21: 30 mg via INTRAVENOUS
  Administered 2018-01-21 (×4): 20 mg via INTRAVENOUS
  Administered 2018-01-21: 40 mg via INTRAVENOUS
  Administered 2018-01-21: 20 mg via INTRAVENOUS
  Administered 2018-01-21 (×2): 30 mg via INTRAVENOUS
  Administered 2018-01-21: 20 mg via INTRAVENOUS
  Administered 2018-01-21: 100 mg via INTRAVENOUS
  Administered 2018-01-21: 20 mg via INTRAVENOUS
  Administered 2018-01-21: 30 mg via INTRAVENOUS
  Administered 2018-01-21 (×2): 50 mg via INTRAVENOUS
  Administered 2018-01-21: 20 mg via INTRAVENOUS

## 2018-01-21 MED ORDER — METOPROLOL TARTRATE 5 MG/5ML IV SOLN
INTRAVENOUS | Status: DC | PRN
  Administered 2018-01-21: 1 mg via INTRAVENOUS

## 2018-01-21 MED ORDER — LIDOCAINE HCL (CARDIAC) PF 100 MG/5ML IV SOSY
PREFILLED_SYRINGE | INTRAVENOUS | Status: DC | PRN
  Administered 2018-01-21: 40 mg via INTRAVENOUS

## 2018-01-21 MED ORDER — LACTATED RINGERS IV SOLN
INTRAVENOUS | Status: DC
  Administered 2018-01-21: 08:00:00 via INTRAVENOUS

## 2018-01-21 MED ORDER — DEXMEDETOMIDINE HCL 200 MCG/2ML IV SOLN
INTRAVENOUS | Status: DC | PRN
  Administered 2018-01-21: 8 ug via INTRAVENOUS

## 2018-01-21 MED ORDER — STERILE WATER FOR IRRIGATION IR SOLN
Status: DC | PRN
  Administered 2018-01-21: 09:00:00

## 2018-01-21 MED ORDER — SODIUM CHLORIDE 0.9 % IV SOLN: INTRAVENOUS | Status: DC

## 2018-01-21 MED ORDER — LACTATED RINGERS IV SOLN: INTRAVENOUS | Status: DC

## 2018-01-21 MED ORDER — GLYCOPYRROLATE 0.2 MG/ML IJ SOLN
INTRAMUSCULAR | Status: DC | PRN
  Administered 2018-01-21: 0.2 mg via INTRAVENOUS

## 2018-01-21 SURGICAL SUPPLY — 17 items
BLOCK BITE 60FR ADLT L/F GRN (MISCELLANEOUS) ×4 IMPLANT
CANISTER SUCT 1200ML W/VALVE (MISCELLANEOUS) ×4 IMPLANT
CLIP HMST 235XBRD CATH ROT (MISCELLANEOUS) ×4 IMPLANT
CLIP RESOLUTION 360 11X235 (MISCELLANEOUS) ×4
ELECT REM PT RETURN 9FT ADLT (ELECTROSURGICAL) ×4
ELECTRODE REM PT RTRN 9FT ADLT (ELECTROSURGICAL) ×2 IMPLANT
ELEVIEW  INJECTABLE COMP 10 (MISCELLANEOUS) ×2
FORCEPS BIOP RAD 4 LRG CAP 4 (CUTTING FORCEPS) ×4 IMPLANT
GOWN CVR UNV OPN BCK APRN NK (MISCELLANEOUS) ×4 IMPLANT
GOWN ISOL THUMB LOOP REG UNIV (MISCELLANEOUS) ×4
INJECTABLE ELEVIEW COMP 10 (MISCELLANEOUS) ×2 IMPLANT
INJECTOR VARIJECT VIN23 (MISCELLANEOUS) ×2 IMPLANT
KIT ENDO PROCEDURE OLY (KITS) ×4 IMPLANT
SNARE SHORT THROW 13M SML OVAL (MISCELLANEOUS) ×4 IMPLANT
TRAP ETRAP POLY (MISCELLANEOUS) ×4 IMPLANT
VARIJECT INJECTOR VIN23 (MISCELLANEOUS) ×4
WATER STERILE IRR 250ML POUR (IV SOLUTION) ×4 IMPLANT

## 2018-01-21 NOTE — Transfer of Care (Signed)
Immediate Anesthesia Transfer of Care Note  Patient: Mitchell Robbins  Procedure(s) Performed: COLONOSCOPY WITH BIOPSIES (N/A Rectum) ESOPHAGOGASTRODUODENOSCOPY (EGD) WITH BIOPSIES (N/A Throat) POLYPECTOMY (N/A Rectum)  Patient Location: PACU  Anesthesia Type: General  Level of Consciousness: awake, alert  and patient cooperative  Airway and Oxygen Therapy: Patient Spontanous Breathing and Patient connected to supplemental oxygen  Post-op Assessment: Post-op Vital signs reviewed, Patient's Cardiovascular Status Stable, Respiratory Function Stable, Patent Airway and No signs of Nausea or vomiting  Post-op Vital Signs: Reviewed and stable  Complications: No apparent anesthesia complications

## 2018-01-21 NOTE — Interval H&P Note (Signed)
History and Physical Interval Note:  01/21/2018 8:12 AM  Mitchell Robbins  has presented today for surgery, with the diagnosis of Screening Z12.11 Duodenal ulcer K26.9  The various methods of treatment have been discussed with the patient and family. After consideration of risks, benefits and other options for treatment, the patient has consented to  Procedure(s): COLONOSCOPY WITH PROPOFOL (N/A) ESOPHAGOGASTRODUODENOSCOPY (EGD) WITH PROPOFOL (N/A) as a surgical intervention .  The patient's history has been reviewed, patient examined, no change in status, stable for surgery.  I have reviewed the patient's chart and labs.  Questions were answered to the patient's satisfaction.     Yulitza Shorts [employer]

## 2018-01-21 NOTE — Anesthesia Preprocedure Evaluation (Signed)
Anesthesia Evaluation  Patient identified by MRN, date of birth, ID band  Airway Mallampati: II  TM Distance: >3 FB Neck ROM: Full    Dental   Pulmonary Current Smoker,    Pulmonary exam normal        Cardiovascular hypertension, Pt. on medications Normal cardiovascular exam     Neuro/Psych    GI/Hepatic   Endo/Other    Renal/GU      Musculoskeletal   Abdominal   Peds  Hematology   Anesthesia Other Findings   Reproductive/Obstetrics                             Anesthesia Physical Anesthesia Plan  ASA: II  Anesthesia Plan: General   Post-op Pain Management:    Induction:   PONV Risk Score and Plan:   Airway Management Planned: Natural Airway  Additional Equipment:   Intra-op Plan:   Post-operative Plan:   Informed Consent:   Plan Discussed with: CRNA  Anesthesia Plan Comments:         Anesthesia Quick Evaluation

## 2018-01-21 NOTE — Anesthesia Procedure Notes (Signed)
Date/Time: 01/21/2018 8:53 AM Performed by: Janna Arch, CRNA Pre-anesthesia Checklist: Patient identified, Emergency Drugs available, Suction available, Timeout performed and Patient being monitored Patient Re-evaluated:Patient Re-evaluated prior to induction Oxygen Delivery Method: Nasal cannula Placement Confirmation: positive ETCO2

## 2018-01-21 NOTE — Op Note (Signed)
[facility] Gastroenterology Patient Name: Mitchell Robbins Procedure Date: 01/21/2018 8:48 AM MRN: [id number] Account #: [id number] Date of Birth: [birth year] Admit Type: Outpatient Age: 62 Room: [facility] OR ROOM 01 Gender: Male Note Status: Finalized Procedure:            Upper GI endoscopy Indications:          Follow-up of peptic ulcer, Personal history of peptic                        ulcer disease Providers:            Lucilla Lame MD, MD Referring MD:         [place], MD (Referring MD) Medicines:            Propofol per Anesthesia Complications:        No immediate complications. Procedure:            Pre-Anesthesia Assessment:                       - Prior to the procedure, a History and Physical was                        performed, and patient medications and allergies were                        reviewed. The patient's tolerance of previous                        anesthesia was also reviewed. The risks and benefits of                        the procedure and the sedation options and risks were                        discussed with the patient. All questions were                        answered, and informed consent was obtained. Prior                        Anticoagulants: The patient has taken no previous                        anticoagulant or antiplatelet agents. ASA Grade                        Assessment: II - A patient with mild systemic disease.                        After reviewing the risks and benefits, the patient was                        deemed in satisfactory condition to undergo the                        procedure.                       After obtaining informed consent, the endoscope was  passed under direct vision. Throughout the procedure,                        the patient's blood pressure, pulse, and oxygen                        saturations were monitored continuously. The was              introduced through the mouth, and advanced to the                        second part of duodenum. The upper GI endoscopy was                        accomplished without difficulty. The patient tolerated                        the procedure well. Findings:      The examined esophagus was normal.      The stomach was normal.      A few 7 mm sessile polyps with no bleeding were found in the duodenal       bulb. Biopsies were taken with a cold forceps for histology. Impression:           - Normal esophagus.                       - Normal stomach.                       - A few duodenal polyps. Biopsied. Recommendation:       - Discharge patient to home.                       - Resume previous diet.                       - Continue present medications.                       - Await pathology results.                       - Perform a colonoscopy today. Procedure Code(s):    --- Professional ---                       [phone], Esophagogastroduodenoscopy, flexible, transoral;                        with biopsy, single or multiple Diagnosis Code(s):    --- Professional ---                       Z87.11, Personal history of peptic ulcer disease                       K27.9, Peptic ulcer, site unspecified, unspecified as                        acute or chronic, without hemorrhage or perforation                       K31.7, Polyp of stomach and duodenum CPT  copyright 2018 American Medical Association. All rights reserved. The codes documented in this report are preliminary and upon coder review may  be revised to meet current compliance requirements. Lucilla Lame MD, MD 01/21/2018 9:04:30 AM This report has been signed electronically. Number of Addenda: 0 Note Initiated On: 01/21/2018 8:48 AM      [facility]

## 2018-01-21 NOTE — Op Note (Addendum)
[facility] Gastroenterology Patient Name: Mitchell Robbins Procedure Date: 01/21/2018 8:48 AM MRN: [id number] Account #: [id number] Date of Birth: 03/31/1955 Admit Type: Outpatient Age: 62 Room: [facility] OR ROOM 01 Gender: Male Note Status: Finalized Procedure:            Colonoscopy Indications:          Screening for colorectal malignant neoplasm Providers:            Lucilla Lame MD, MD Medicines:            Propofol per Anesthesia Complications:        No immediate complications. Procedure:            Pre-Anesthesia Assessment:                       - Prior to the procedure, a History and Physical was                        performed, and patient medications and allergies were                        reviewed. The patient's tolerance of previous                        anesthesia was also reviewed. The risks and benefits of                        the procedure and the sedation options and risks were                        discussed with the patient. All questions were                        answered, and informed consent was obtained. Prior                        Anticoagulants: The patient has taken no previous                        anticoagulant or antiplatelet agents. ASA Grade                        Assessment: II - A patient with mild systemic disease.                        After reviewing the risks and benefits, the patient was                        deemed in satisfactory condition to undergo the                        procedure.                       After obtaining informed consent, the colonoscope was                        passed under direct vision. Throughout the procedure,                        the patient's blood pressure,  pulse, and oxygen                        saturations were monitored continuously. The was                        introduced through the anus and advanced to the the                        cecum, identified by appendiceal orifice and  ileocecal                        valve. The colonoscopy was performed without                        difficulty. The patient tolerated the procedure well.                        The quality of the bowel preparation was excellent. Findings:      The perianal and digital rectal examinations were normal.      A 8 mm polyp was found in the ileocecal valve. The polyp was sessile.       The polyp was removed with a hot snare. Resection and retrieval were       complete.      A 4 mm polyp was found in the ascending colon. The polyp was sessile.       The polyp was removed with a cold snare. Resection and retrieval were       complete.      A 10 mm polyp was found in the hepatic flexure. The polyp was sessile.       Area was successfully injected with 4 mL saline with indigo carmine for       a lift polypectomy. The polyp was removed with a hot snare. Resection       and retrieval were complete. To prevent bleeding post-intervention, one       hemostatic clip was successfully placed (MR conditional). There was no       bleeding at the end of the procedure.      Three sessile polyps were found in the transverse colon. The polyps were       3 to 6 mm in size. These polyps were removed with a hot snare. Resection       and retrieval were complete. To prevent bleeding post-intervention, one       hemostatic clip was successfully placed (MR conditional). There was no       bleeding at the end of the procedure.      Four sessile polyps were found in the descending colon. The polyps were       3 to 7 mm in size. These polyps were removed with a cold snare.       Resection and retrieval were complete.      A 5 mm polyp was found in the sigmoid colon. The polyp was sessile. The       polyp was removed with a hot snare. Resection and retrieval were       complete.      A 6 mm polyp was found in the rectum. The polyp was pedunculated. The       polyp was removed with a hot snare. Resection and retrieval  were  complete. Impression:           - One 8 mm polyp at the ileocecal valve, removed with a                        hot snare. Resected and retrieved.                       - One 4 mm polyp in the ascending colon, removed with a                        cold snare. Resected and retrieved.                       - One 10 mm polyp at the hepatic flexure, removed with                        a hot snare. Resected and retrieved. Injected. Clip (MR                        conditional) was placed.                       - Three 3 to 6 mm polyps in the transverse colon,                        removed with a hot snare. Resected and retrieved. Clip                        (MR conditional) was placed.                       - Four 3 to 7 mm polyps in the descending colon,                        removed with a cold snare. Resected and retrieved.                       - One 5 mm polyp in the sigmoid colon, removed with a                        hot snare. Resected and retrieved.                       - One 6 mm polyp in the rectum, removed with a hot                        snare. Resected and retrieved. Recommendation:       - Discharge patient to home.                       - Resume previous diet.                       - Continue present medications.                       - Await pathology results.                       - Repeat colonoscopy in  1 year for surveillance. Procedure Code(s):    --- Professional ---                       [phone], Colonoscopy, flexible; with removal of tumor(s),                        polyp(s), or other lesion(s) by snare technique                       [id number], Colonoscopy, flexible; with directed submucosal                        injection(s), any substance Diagnosis Code(s):    --- Professional ---                       Z12.11, Encounter for screening for malignant neoplasm                        of colon                       D12.0, Benign neoplasm of cecum                        D12.2, Benign neoplasm of ascending colon                       D12.3, Benign neoplasm of transverse colon (hepatic                        flexure or splenic flexure)                       D12.5, Benign neoplasm of sigmoid colon                       K62.1, Rectal polyp                       D12.4, Benign neoplasm of descending colon CPT copyright 2018 American Medical Association. All rights reserved. The codes documented in this report are preliminary and upon coder review may  be revised to meet current compliance requirements. Lucilla Lame MD, MD 01/21/2018 9:40:34 AM This report has been signed electronically. Number of Addenda: 0 Note Initiated On: 01/21/2018 8:48 AM Scope Withdrawal Time: 0 hours 25 minutes 31 seconds  Total Procedure Duration: 0 hours 28 minutes 25 seconds       [facility]

## 2018-01-21 NOTE — Anesthesia Postprocedure Evaluation (Signed)
Anesthesia Post Note  Patient: Mitchell Robbins  Procedure(s) Performed: COLONOSCOPY WITH BIOPSIES (N/A Rectum) ESOPHAGOGASTRODUODENOSCOPY (EGD) WITH BIOPSIES (N/A Throat) POLYPECTOMY (N/A Rectum)  Patient location during evaluation: PACU Anesthesia Type: General Level of consciousness: awake and alert Pain management: pain level controlled Vital Signs Assessment: post-procedure vital signs reviewed and stable Respiratory status: spontaneous breathing, nonlabored ventilation, respiratory function stable and patient connected to nasal cannula oxygen Cardiovascular status: blood pressure returned to baseline and stable Postop Assessment: no apparent nausea or vomiting Anesthetic complications: no    Marshell Levan

## 2018-01-22 ENCOUNTER — Encounter: Payer: Self-pay | Admitting: Gastroenterology

## 2018-01-26 LAB — SURGICAL PATHOLOGY

## 2018-01-29 ENCOUNTER — Telehealth: Payer: Self-pay

## 2018-01-29 NOTE — Telephone Encounter (Signed)
-----   Message from Lucilla Lame, MD sent at 01/26/2018  6:08 PM EST ----- Please have the patient come in for a follow up.

## 2018-01-29 NOTE — Telephone Encounter (Signed)
Pt scheduled for a follow up appt to discuss procedure results on 02/03/18.

## 2018-02-03 ENCOUNTER — Encounter: Payer: Self-pay | Admitting: Gastroenterology

## 2018-02-03 ENCOUNTER — Encounter (INDEPENDENT_AMBULATORY_CARE_PROVIDER_SITE_OTHER): Payer: Self-pay

## 2018-02-03 ENCOUNTER — Ambulatory Visit (INDEPENDENT_AMBULATORY_CARE_PROVIDER_SITE_OTHER): Payer: Self-pay | Admitting: Gastroenterology

## 2018-02-03 VITALS — BP 149/92 | HR 90 | Ht 71.0 in | Wt 266.5 lb

## 2018-02-03 DIAGNOSIS — D3A8 Other benign neuroendocrine tumors: Secondary | ICD-10-CM

## 2018-02-03 NOTE — Progress Notes (Signed)
Primary Care Physician: Center, [facility]  Primary Gastroenterologist:  Dr. Lucilla Lame  Chief Complaint  Patient presents with  . Follow up procedure results    HPI: Mitchell Robbins is a 62 y.o. male here for follow-up after having an EGD.  Patient was in the hospital for an upper GI bleed and was found to have an ulcerated lesion in his duodenum.  There are other lesion in the duodenum that were also seen at that time that were polypoid.  The patient had the lesion clipped and was discharged from the hospital.  The patient then follow-up in the office and was suggested to have a repeat EGD to look at these multiple polyps in the duodenum.  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  The patient is here today to follow-up on the pathology results.  Current Outpatient Medications  Medication Sig Dispense Refill  . amLODipine (NORVASC) 10 MG tablet 1 BY MOUTH DAILY FOR HIGH BLOOD PRESSURE  1  . aspirin 81 MG tablet 1 BY MOUTH DAILY FOR HEART PROTECTION  1  . hydrochlorothiazide (HYDRODIURIL) 50 MG tablet Take by mouth.    Marland Kitchen lisinopril (PRINIVIL,ZESTRIL) 20 MG tablet Take 20 mg by mouth daily.    . predniSONE (STERAPRED UNI-PAK 21 TAB) 10 MG (21) TBPK tablet Dispense taper pack as directed 21 tablet 0  . bisacodyl (DULCOLAX) 5 MG EC tablet Take 1 tablet (5 mg total) by mouth daily as needed for moderate constipation. (Patient not taking: Reported on 01/12/2018) 30 tablet 0  . HYDROcodone-acetaminophen (NORCO/VICODIN) 5-325 MG tablet Take 1 tablet by mouth every 6 (six) hours as needed for moderate pain. (Patient not taking: Reported on 01/12/2018) 10 tablet 0  . ondansetron (ZOFRAN ODT) 4 MG disintegrating tablet Take 1 tablet (4 mg total) by mouth every 8 (eight) hours as needed for nausea or vomiting. (Patient not taking: Reported on 01/12/2018) 20 tablet 0  . oxyCODONE-acetaminophen  (PERCOCET) 5-325 MG tablet Take 1 tablet by mouth every 4 (four) hours as needed for severe pain. (Patient not taking: Reported on 01/12/2018) 8 tablet 0  . pantoprazole (PROTONIX) 40 MG tablet Take 1 tablet (40 mg total) by mouth 2 (two) times daily before a meal. (Patient not taking: Reported on 01/12/2018) 60 tablet 0  . senna-docusate (SENOKOT-S) 8.6-50 MG tablet Take 1 tablet by mouth at bedtime as needed for mild constipation. (Patient not taking: Reported on 01/12/2018) 20 tablet 0   No current facility-administered medications for this visit.     Allergies as of 02/03/2018  . (No Known Allergies)    ROS:  General: Negative for anorexia, weight loss, fever, chills, fatigue, weakness. ENT: Negative for hoarseness, difficulty swallowing , nasal congestion. CV: Negative for chest pain, angina, palpitations, dyspnea on exertion, peripheral edema.  Respiratory: Negative for dyspnea at rest, dyspnea on exertion, cough, sputum, wheezing.  GI: See history of present illness. GU:  Negative for dysuria, hematuria, urinary incontinence, urinary frequency, nocturnal urination.  Endo: Negative for unusual weight change.    Physical Examination:   BP (!) 149/92   Pulse 90   Ht 5\' 11"  (1.803 m)   Wt 266 lb 8 oz (120.9 kg)   BMI 37.17 kg/m   General: Well-nourished, well-developed in no acute distress.  Eyes: No icterus. Conjunctivae pink. Mouth: Oropharyngeal mucosa moist and pink , no lesions erythema or exudate. Lungs: Clear to auscultation bilaterally.  Non-labored. Heart: Regular rate and rhythm, no murmurs rubs or gallops.  Abdomen: Bowel sounds are normal, nontender, nondistended, no hepatosplenomegaly or masses, no abdominal bruits or hernia , no rebound or guarding.   Extremities: No lower extremity edema. No clubbing or deformities. Neuro: Alert and oriented x 3.  Grossly intact. Skin: Warm and dry, no jaundice.   Psych: Alert and cooperative, normal mood and affect.  Labs:      Imaging Studies: US Venous Img Upper Uni Left  Result Date: 01/04/2018 CLINICAL DATA:  Left upper extremity pain and swelling. EXAM: Left UPPER EXTREMITY VENOUS DOPPLER ULTRASOUND TECHNIQUE: Gray-scale sonography with graded compression, as well as color Doppler and duplex ultrasound were performed to evaluate the upper extremity deep venous system from the level of the subclavian vein and including the jugular, axillary, basilic, radial, ulnar and upper cephalic vein. Spectral Doppler was utilized to evaluate flow at rest and with distal augmentation maneuvers. COMPARISON:  Ultrasound of September 30, 2015. FINDINGS: Contralateral Subclavian Vein: Respiratory phasicity is normal and symmetric with the symptomatic side. No evidence of thrombus. Normal compressibility. Internal Jugular Vein: No evidence of thrombus. Normal compressibility, respiratory phasicity and response to augmentation. Subclavian Vein: No evidence of thrombus. Normal compressibility, respiratory phasicity and response to augmentation. Axillary Vein: No evidence of thrombus. Normal compressibility, respiratory phasicity and response to augmentation. Cephalic Vein: No evidence of thrombus. Normal compressibility, respiratory phasicity and response to augmentation. Basilic Vein: No evidence of thrombus. Normal compressibility, respiratory phasicity and response to augmentation. Brachial Veins: No evidence of thrombus. Normal compressibility, respiratory phasicity and response to augmentation. Radial Veins: No evidence of thrombus. Normal compressibility, respiratory phasicity and response to augmentation. Ulnar Veins: No evidence of thrombus. Normal compressibility, respiratory phasicity and response to augmentation. Venous Reflux:  None visualized. Other Findings:  None visualized. IMPRESSION: No evidence of DVT within the left upper extremity. Electronically Signed   By: Marijo Conception, M.D.   On: 01/04/2018 19:11   Dg Hand Complete  Left  Result Date: 01/04/2018 CLINICAL DATA:  62 year old male with a history of left hand swelling EXAM: LEFT HAND - COMPLETE 3+ VIEW COMPARISON:  None. FINDINGS: No acute displaced fracture. Degenerative changes of the interphalangeal joints, predominantly the DIP is. No subluxation/dislocation. No erosive changes. Lateral view demonstrates soft tissue swelling on the dorsum of the hand. No radiopaque foreign body. IMPRESSION: Soft tissue swelling on the dorsum of the hand on the lateral view with no acute bony abnormality. Degenerative changes. Electronically Signed   By: Corrie Mckusick D.O.   On: 01/04/2018 12:32    Assessment and Plan:   Mitchell Robbins is a 62 y.o. y/o male who had an EGD with multiple polyps in the duodenum.  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  The patient may also have multiple other lesions throughout the small bowel and should have the small bowel investigated.  I recommend that the patient have all this done at a tertiary care center where they can possibly excise these lesions endoscopically.    Lucilla Lame, MD. Marval Regal   Note: This dictation was prepared with Dragon dictation along with smaller phrase technology. Any transcriptional errors that result from this process are unintentional.

## 2018-02-22 ENCOUNTER — Telehealth: Payer: Self-pay

## 2018-02-22 NOTE — Telephone Encounter (Signed)
Contacted [facility] to inquire about referral that was sent early December. Scheduler stated they were behind with scheduling appts. Pt is ready to be scheduled and will contacted today. Notified pt's daughter of this information.

## 2018-02-22 NOTE — Telephone Encounter (Signed)
The patient hasn't heard from the [facility] providers. Pt says Dr. Allen Norris told him to expect a call within 14 days due to polyps found during colon test. Pls call pt

## 2018-02-22 NOTE — Telephone Encounter (Signed)
Pls call Isidoro Donning with the above info. Maricela Bo is listed on [place]. [phone]

## 2018-05-17 ENCOUNTER — Ambulatory Visit: Payer: Self-pay | Admitting: Gastroenterology

## 2018-05-20 ENCOUNTER — Other Ambulatory Visit: Payer: Self-pay

## 2018-05-20 ENCOUNTER — Other Ambulatory Visit
Admission: RE | Admit: 2018-05-20 | Discharge: 2018-05-20 | Disposition: A | Discharge status: Home / Self Care | Payer: Self-pay | Attending: Gastroenterology | Admitting: Gastroenterology

## 2018-05-20 ENCOUNTER — Ambulatory Visit (INDEPENDENT_AMBULATORY_CARE_PROVIDER_SITE_OTHER): Payer: Self-pay | Admitting: Gastroenterology

## 2018-05-20 ENCOUNTER — Encounter: Payer: Self-pay | Admitting: Gastroenterology

## 2018-05-20 VITALS — BP 170/103 | HR 108 | Ht 71.0 in | Wt 258.4 lb

## 2018-05-20 DIAGNOSIS — G8929 Other chronic pain: Secondary | ICD-10-CM

## 2018-05-20 DIAGNOSIS — R3915 Urgency of urination: Secondary | ICD-10-CM

## 2018-05-20 DIAGNOSIS — R1031 Right lower quadrant pain: Secondary | ICD-10-CM

## 2018-05-20 LAB — PSA: PROSTATIC SPECIFIC ANTIGEN: 2.98 ng/mL (ref 0.00–4.00)

## 2018-05-20 NOTE — Progress Notes (Signed)
Primary Care Physician: [facility], [facility]  Primary Gastroenterologist:  Dr. Lucilla Lame  Chief Complaint  Patient presents with  . Abdominal Pain    HPI: Mitchell Robbins is a 63 y.o. male here for follow-up after having a neuroendocrine tumor found in the duodenum.  The patient was sent to [facility] and met with Dr. Stephanie Acre who recommended resection of this area.  The patient was sent off for blood work by Dr. Phillip Heal for chromogranin A and for gastrin.  The chromogranin A was elevated at 113 with a normal gastrin.  The patient was given options and told at [facility] of the possible modalities to resect the lesion. The patient had the lesion and removed with a recommendation for follow-up endoscopy in 1 year. He reports that he has some right-sided abdominal pain that comes and goes.  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  Current Outpatient Medications  Medication Sig Dispense Refill  . hydrochlorothiazide (HYDRODIURIL) 50 MG tablet Take by mouth.    Marland Kitchen lisinopril (PRINIVIL,ZESTRIL) 20 MG tablet Take 20 mg by mouth daily.    Marland Kitchen amLODipine (NORVASC) 10 MG tablet 1 BY MOUTH DAILY FOR HIGH BLOOD PRESSURE  1  . aspirin 81 MG tablet 1 BY MOUTH DAILY FOR HEART PROTECTION  1  . bisacodyl (DULCOLAX) 5 MG EC tablet Take 1 tablet (5 mg total) by mouth daily as needed for moderate constipation. (Patient not taking: Reported on 01/12/2018) 30 tablet 0  . HYDROcodone-acetaminophen (NORCO/VICODIN) 5-325 MG tablet Take 1 tablet by mouth every 6 (six) hours as needed for moderate pain. (Patient not taking: Reported on 01/12/2018) 10 tablet 0  . ondansetron (ZOFRAN ODT) 4 MG disintegrating tablet Take 1 tablet (4 mg total) by mouth every 8 (eight) hours as needed for nausea or vomiting. (Patient not taking: Reported on 01/12/2018) 20 tablet 0  . oxyCODONE-acetaminophen (PERCOCET) 5-325 MG tablet Take 1 tablet by mouth every 4  (four) hours as needed for severe pain. (Patient not taking: Reported on 01/12/2018) 8 tablet 0  . pantoprazole (PROTONIX) 40 MG tablet Take 1 tablet (40 mg total) by mouth 2 (two) times daily before a meal. (Patient not taking: Reported on 01/12/2018) 60 tablet 0  . predniSONE (STERAPRED UNI-PAK 21 TAB) 10 MG (21) TBPK tablet Dispense taper pack as directed (Patient not taking: Reported on 05/20/2018) 21 tablet 0  . senna-docusate (SENOKOT-S) 8.6-50 MG tablet Take 1 tablet by mouth at bedtime as needed for mild constipation. (Patient not taking: Reported on 01/12/2018) 20 tablet 0   No current facility-administered medications for this visit.     Allergies as of 05/20/2018  . (No Known Allergies)    ROS:  General: Negative for anorexia, weight loss, fever, chills, fatigue, weakness. ENT: Negative for hoarseness, difficulty swallowing , nasal congestion. CV: Negative for chest pain, angina, palpitations, dyspnea on exertion, peripheral edema.  Respiratory: Negative for dyspnea at rest, dyspnea on exertion, cough, sputum, wheezing.  GI: See history of present illness. GU:  Negative for dysuria, hematuria, urinary incontinence, urinary frequency, nocturnal urination.  Endo: Negative for unusual weight change.    Physical Examination:   BP (!) 170/103   Pulse (!) 108   Ht 5' 11"  (1.803 m)   Wt 258 lb 6.4 oz (117.2 kg)   BMI 36.04 kg/m   General: Well-nourished, well-developed in no acute distress.  Eyes: No icterus. Conjunctivae pink. Mouth:  Oropharyngeal mucosa moist and pink , no lesions erythema or exudate. Lungs: Clear to auscultation bilaterally. Non-labored. Heart: Regular rate and rhythm, no murmurs rubs or gallops.  Abdomen: Bowel sounds are normal, nontender, nondistended, no hepatosplenomegaly or masses, no abdominal bruits or hernia , no rebound or guarding.   Extremities: No lower extremity edema. No clubbing or deformities. Neuro: Alert and oriented x 3.  Grossly intact.  Skin: Warm and dry, no jaundice.   Psych: Alert and cooperative, normal mood and affect.  Labs:    Imaging Studies: No results found.  Assessment and Plan:   Mitchell Robbins is a 63 y.o. y/o male who comes in today with a history a neuroendocrine tumor that was removed at [facility]. It presented as a duodenal ulcer and was biopsied prior to be sent to [facility]. The patient has been doing well without any black stools or bloody stools.  He does report that he has some right-sided abdominal pain that is not reproducible on physical exam and most consistent with musculoskeletal pain.  The patient will need a repeat colonoscopy in one year due to having so many polyps that his previous colonoscopy. The patient would also like a PSA sent off due to a family history of prostate cancer and he has not been checked in some time. The patient has been explained the plan and agrees with it.    Lucilla Lame, MD. Marval Regal   Note: This dictation was prepared with Dragon dictation along with smaller phrase technology. Any transcriptional errors that result from this process are unintentional.

## 2018-05-21 ENCOUNTER — Telehealth: Payer: Self-pay

## 2018-05-21 NOTE — Telephone Encounter (Signed)
-----   Message from Lucilla Lame, MD sent at 05/21/2018 10:46 AM EDT ----- Let the patient know his PSA was normal.

## 2018-05-21 NOTE — Telephone Encounter (Signed)
Pt notified of PSA result. 

## 2018-07-02 ENCOUNTER — Telehealth: Payer: Self-pay | Admitting: Gastroenterology

## 2018-07-02 NOTE — Telephone Encounter (Signed)
Pt had a question regarding his lab result value on las PSA result.

## 2018-07-02 NOTE — Telephone Encounter (Signed)
Pt  Is calling to check on Lab work Regarding  His Prostate? He is sure Dr. Allen Norris ordered this Lab work

## 2018-08-17 ENCOUNTER — Other Ambulatory Visit: Payer: Self-pay | Admitting: Physician Assistant

## 2018-08-17 DIAGNOSIS — M2391 Unspecified internal derangement of right knee: Secondary | ICD-10-CM

## 2018-08-27 ENCOUNTER — Other Ambulatory Visit: Payer: Self-pay

## 2018-08-27 ENCOUNTER — Ambulatory Visit
Admission: RE | Admit: 2018-08-27 | Discharge: 2018-08-27 | Disposition: A | Discharge status: Home / Self Care | Payer: Self-pay | Source: Ambulatory Visit | Attending: Physician Assistant | Admitting: Physician Assistant

## 2018-08-27 DIAGNOSIS — M2391 Unspecified internal derangement of right knee: Secondary | ICD-10-CM | POA: Insufficient documentation

## 2018-12-22 ENCOUNTER — Telehealth: Payer: Self-pay | Admitting: Gastroenterology

## 2018-12-22 ENCOUNTER — Other Ambulatory Visit: Payer: Self-pay

## 2018-12-22 DIAGNOSIS — Z86010 Personal history of colonic polyps: Secondary | ICD-10-CM

## 2018-12-22 NOTE — Telephone Encounter (Signed)
Gastroenterology Pre-Procedure Review  Request Date: Tuesday 01/11/19  Requesting Physician: Dr. Allen Norris  PATIENT REVIEW QUESTIONS: The patient responded to the following health history questions as indicated:    1. Are you having any GI issues? no 2. Do you have a personal history of Polyps? yes (2019 colon polyps) 3. Do you have a family history of Colon Cancer or Polyps? no 4. Diabetes Mellitus? no 5. Joint replacements in the past 12 months?no 6. Major health problems in the past 3 months?no 7. Any artificial heart valves, MVP, or defibrillator?no    MEDICATIONS & ALLERGIES:    Patient reports the following regarding taking any anticoagulation/antiplatelet therapy:   Plavix, Coumadin, Eliquis, Xarelto, Lovenox, Pradaxa, Brilinta, or Effient? no Aspirin? no  Patient confirms/reports the following medications:  Current Outpatient Medications  Medication Sig Dispense Refill  . amLODipine (NORVASC) 10 MG tablet 1 BY MOUTH DAILY FOR HIGH BLOOD PRESSURE  1  . aspirin 81 MG tablet 1 BY MOUTH DAILY FOR HEART PROTECTION  1  . bisacodyl (DULCOLAX) 5 MG EC tablet Take 1 tablet (5 mg total) by mouth daily as needed for moderate constipation. (Patient not taking: Reported on 01/12/2018) 30 tablet 0  . hydrochlorothiazide (HYDRODIURIL) 50 MG tablet Take by mouth.    Marland Kitchen HYDROcodone-acetaminophen (NORCO/VICODIN) 5-325 MG tablet Take 1 tablet by mouth every 6 (six) hours as needed for moderate pain. (Patient not taking: Reported on 01/12/2018) 10 tablet 0  . lisinopril (PRINIVIL,ZESTRIL) 20 MG tablet Take 20 mg by mouth daily.    . ondansetron (ZOFRAN ODT) 4 MG disintegrating tablet Take 1 tablet (4 mg total) by mouth every 8 (eight) hours as needed for nausea or vomiting. (Patient not taking: Reported on 01/12/2018) 20 tablet 0  . oxyCODONE-acetaminophen (PERCOCET) 5-325 MG tablet Take 1 tablet by mouth every 4 (four) hours as needed for severe pain. (Patient not taking: Reported on 01/12/2018) 8 tablet 0   . pantoprazole (PROTONIX) 40 MG tablet Take 1 tablet (40 mg total) by mouth 2 (two) times daily before a meal. (Patient not taking: Reported on 01/12/2018) 60 tablet 0  . predniSONE (STERAPRED UNI-PAK 21 TAB) 10 MG (21) TBPK tablet Dispense taper pack as directed (Patient not taking: Reported on 05/20/2018) 21 tablet 0  . senna-docusate (SENOKOT-S) 8.6-50 MG tablet Take 1 tablet by mouth at bedtime as needed for mild constipation. (Patient not taking: Reported on 01/12/2018) 20 tablet 0   No current facility-administered medications for this visit.     Patient confirms/reports the following allergies:  No Known Allergies  No orders of the defined types were placed in this encounter.   AUTHORIZATION INFORMATION Primary Insurance: 1D#: Group #:  Secondary Insurance: 1D#: Group #:  SCHEDULE INFORMATION: Date: 01/11/19 Time: Location:[facility]

## 2018-12-22 NOTE — Telephone Encounter (Signed)
Pt received recall letter for his colonoscopy please call to schedule

## 2019-01-07 ENCOUNTER — Other Ambulatory Visit
Admission: RE | Admit: 2019-01-07 | Discharge: 2019-01-07 | Disposition: A | Discharge status: Home / Self Care | Payer: Self-pay | Source: Ambulatory Visit | Attending: Gastroenterology | Admitting: Gastroenterology

## 2019-01-07 ENCOUNTER — Other Ambulatory Visit: Payer: Self-pay

## 2019-01-07 DIAGNOSIS — Z01812 Encounter for preprocedural laboratory examination: Secondary | ICD-10-CM | POA: Insufficient documentation

## 2019-01-07 DIAGNOSIS — Z20828 Contact with and (suspected) exposure to other viral communicable diseases: Secondary | ICD-10-CM | POA: Insufficient documentation

## 2019-01-07 LAB — SARS CORONAVIRUS 2 (TAT 6-24 HRS): SARS Coronavirus 2: NEGATIVE

## 2019-01-10 ENCOUNTER — Telehealth: Payer: Self-pay

## 2019-01-10 NOTE — Telephone Encounter (Signed)
Patient is calling because patient has no insurance and states the colonoscopy prep is over 100 dollars. Patient states he can not afford this. Informed patient to come to the [facility] office to get a prep.

## 2019-01-11 ENCOUNTER — Ambulatory Visit: Payer: Self-pay | Admitting: Registered Nurse

## 2019-01-11 ENCOUNTER — Encounter
Admission: RE | Disposition: A | Discharge status: Home / Self Care | Payer: Self-pay | Source: Home / Self Care | Attending: Gastroenterology

## 2019-01-11 ENCOUNTER — Other Ambulatory Visit: Payer: Self-pay

## 2019-01-11 ENCOUNTER — Encounter: Payer: Self-pay | Admitting: *Deleted

## 2019-01-11 ENCOUNTER — Ambulatory Visit
Admission: RE | Admit: 2019-01-11 | Discharge: 2019-01-11 | Disposition: A | Discharge status: Home / Self Care | Payer: Self-pay | Attending: Gastroenterology | Admitting: Gastroenterology

## 2019-01-11 DIAGNOSIS — F17210 Nicotine dependence, cigarettes, uncomplicated: Secondary | ICD-10-CM | POA: Insufficient documentation

## 2019-01-11 DIAGNOSIS — D120 Benign neoplasm of cecum: Secondary | ICD-10-CM | POA: Insufficient documentation

## 2019-01-11 DIAGNOSIS — K621 Rectal polyp: Secondary | ICD-10-CM | POA: Insufficient documentation

## 2019-01-11 DIAGNOSIS — D123 Benign neoplasm of transverse colon: Secondary | ICD-10-CM | POA: Insufficient documentation

## 2019-01-11 DIAGNOSIS — I10 Essential (primary) hypertension: Secondary | ICD-10-CM | POA: Insufficient documentation

## 2019-01-11 DIAGNOSIS — K635 Polyp of colon: Secondary | ICD-10-CM | POA: Insufficient documentation

## 2019-01-11 DIAGNOSIS — D124 Benign neoplasm of descending colon: Secondary | ICD-10-CM | POA: Insufficient documentation

## 2019-01-11 DIAGNOSIS — Z7982 Long term (current) use of aspirin: Secondary | ICD-10-CM | POA: Insufficient documentation

## 2019-01-11 DIAGNOSIS — Z8711 Personal history of peptic ulcer disease: Secondary | ICD-10-CM | POA: Insufficient documentation

## 2019-01-11 DIAGNOSIS — Z8249 Family history of ischemic heart disease and other diseases of the circulatory system: Secondary | ICD-10-CM | POA: Insufficient documentation

## 2019-01-11 DIAGNOSIS — Z86010 Personal history of colonic polyps: Secondary | ICD-10-CM | POA: Insufficient documentation

## 2019-01-11 DIAGNOSIS — D125 Benign neoplasm of sigmoid colon: Secondary | ICD-10-CM | POA: Insufficient documentation

## 2019-01-11 DIAGNOSIS — Z1211 Encounter for screening for malignant neoplasm of colon: Secondary | ICD-10-CM | POA: Insufficient documentation

## 2019-01-11 DIAGNOSIS — Z79899 Other long term (current) drug therapy: Secondary | ICD-10-CM | POA: Insufficient documentation

## 2019-01-11 DIAGNOSIS — D122 Benign neoplasm of ascending colon: Secondary | ICD-10-CM | POA: Insufficient documentation

## 2019-01-11 HISTORY — PX: COLONOSCOPY WITH PROPOFOL: SHX5780

## 2019-01-11 SURGERY — COLONOSCOPY WITH PROPOFOL
Anesthesia: General

## 2019-01-11 MED ORDER — SODIUM CHLORIDE 0.9 % IV SOLN
INTRAVENOUS | Status: DC
  Administered 2019-01-11: 08:00:00 1000 mL via INTRAVENOUS

## 2019-01-11 MED ORDER — PROPOFOL 10 MG/ML IV BOLUS
INTRAVENOUS | Status: DC | PRN
  Administered 2019-01-11: 80 mg via INTRAVENOUS

## 2019-01-11 MED ORDER — PROPOFOL 500 MG/50ML IV EMUL
INTRAVENOUS | Status: DC | PRN
  Administered 2019-01-11: 200 ug/kg/min via INTRAVENOUS

## 2019-01-11 MED ORDER — LABETALOL HCL 5 MG/ML IV SOLN
INTRAVENOUS | Status: DC | PRN
  Administered 2019-01-11 (×2): 5 mg via INTRAVENOUS

## 2019-01-11 NOTE — Anesthesia Postprocedure Evaluation (Signed)
Anesthesia Post Note  Patient: Mitchell Robbins  Procedure(s) Performed: COLONOSCOPY WITH PROPOFOL (N/A )  Patient location during evaluation: Endoscopy Anesthesia Type: General Level of consciousness: awake and alert Pain management: pain level controlled Vital Signs Assessment: post-procedure vital signs reviewed and stable Respiratory status: spontaneous breathing, nonlabored ventilation, respiratory function stable and patient connected to nasal cannula oxygen Cardiovascular status: blood pressure returned to baseline and stable Postop Assessment: no apparent nausea or vomiting Anesthetic complications: no     Last Vitals:  Vitals:   01/11/19 0906 01/11/19 0916  BP: (!) 146/96 (!) 141/88  Pulse: 86 91  Resp: 19 (!) 31  Temp:    SpO2: 93% 93%    Last Pain:  Vitals:   01/11/19 0916  TempSrc:   PainSc: (P) 0-No pain                 Precious Haws Todrick Siedschlag

## 2019-01-11 NOTE — Anesthesia Post-op Follow-up Note (Signed)
Anesthesia QCDR form completed.        

## 2019-01-11 NOTE — Anesthesia Preprocedure Evaluation (Addendum)
Anesthesia Evaluation  Patient identified by MRN, date of birth, ID band Patient awake    Reviewed: Allergy & Precautions, H&P , NPO status , Patient's Chart, lab work & pertinent test results  History of Anesthesia Complications Negative for: history of anesthetic complications  Airway Mallampati: III  TM Distance: >3 FB Neck ROM: full   Comment: Large head and jaw Dental  (+) Missing, Chipped, Poor Dentition   Pulmonary neg shortness of breath, neg COPD, neg recent URI, Current Smoker and Patient abstained from smoking.,    breath sounds clear to auscultation       Cardiovascular hypertension, (-) angina(-) Past MI and (-) Cardiac Stents (-) dysrhythmias (-) Valvular Problems/Murmurs Rhythm:regular     Neuro/Psych negative neurological ROS  negative psych ROS   GI/Hepatic Neg liver ROS, PUD,   Endo/Other  negative endocrine ROS  Renal/GU negative Renal ROS  negative genitourinary   Musculoskeletal   Abdominal   Peds  Hematology negative hematology ROS (+)   Anesthesia Other Findings Past Medical History: No date: Hypertension   Reproductive/Obstetrics negative OB ROS                            Anesthesia Physical  Anesthesia Plan  ASA: II  Anesthesia Plan: General   Post-op Pain Management:    Induction:   PONV Risk Score and Plan: Propofol infusion and TIVA  Airway Management Planned:   Additional Equipment:   Intra-op Plan:   Post-operative Plan:   Informed Consent: I have reviewed the patients History and Physical, chart, labs and discussed the procedure including the risks, benefits and alternatives for the proposed anesthesia with the patient or authorized representative who has indicated his/her understanding and acceptance.     Dental Advisory Given  Plan Discussed with: Anesthesiologist, CRNA and Surgeon  Anesthesia Plan Comments:         Anesthesia  Quick Evaluation

## 2019-01-11 NOTE — Op Note (Signed)
[facility] Gastroenterology Patient Name: Mitchell Robbins Procedure Date: 01/11/2019 8:14 AM MRN: [id number] Account #: [id number] Date of Birth: April 03, 1955 Admit Type: Outpatient Age: 63 Room: [facility] ENDO ROOM 4 Gender: Male Note Status: Finalized Procedure:             Colonoscopy Indications:           High risk colon cancer surveillance: Personal history                         of colonic polyps Providers:             Lucilla Lame MD, MD Referring MD:          [place], MD (Referring MD) Medicines:             Propofol per Anesthesia Complications:         No immediate complications. Procedure:             Pre-Anesthesia Assessment:                        - Prior to the procedure, a History and Physical was                         performed, and patient medications and allergies were                         reviewed. The patient's tolerance of previous                         anesthesia was also reviewed. The risks and benefits                         of the procedure and the sedation options and risks                         were discussed with the patient. All questions were                         answered, and informed consent was obtained. Prior                         Anticoagulants: The patient has taken no previous                         anticoagulant or antiplatelet agents. ASA Grade                         Assessment: II - A patient with mild systemic disease.                         After reviewing the risks and benefits, the patient                         was deemed in satisfactory condition to undergo the                         procedure.  After obtaining informed consent, the colonoscope was                         passed under direct vision. Throughout the procedure,                         the patient's blood pressure, pulse, and oxygen                         saturations were monitored  continuously. The                         Colonoscope was introduced through the anus and                         advanced to the the cecum, identified by appendiceal                         orifice and ileocecal valve. The colonoscopy was                         performed without difficulty. The patient tolerated                         the procedure well. The quality of the bowel                         preparation was excellent. Findings:      The perianal and digital rectal examinations were normal.      A 3 mm polyp was found in the cecum. The polyp was sessile. The polyp       was removed with a cold snare. Resection and retrieval were complete.      Three sessile polyps were found in the ascending colon. The polyps were       4 to 7 mm in size. These polyps were removed with a hot snare. Resection       and retrieval were complete.      A 7 mm polyp was found in the transverse colon. The polyp was sessile.       The polyp was removed with a cold snare. Resection and retrieval were       complete.      A 9 mm polyp was found in the transverse colon. The polyp was sessile.       The polyp was removed with a hot snare. Resection and retrieval were       complete.      A 6 mm polyp was found in the descending colon. The polyp was sessile.       The polyp was removed with a cold snare. Resection and retrieval were       complete.      Eight sessile polyps were found in the sigmoid colon. The polyps were 3       to 8 mm in size. These polyps were removed with a cold snare. Resection       and retrieval were complete.      Two sessile polyps were found in the rectum. The polyps were 4 to 7 mm       in size. These polyps were removed with a hot snare. Resection and  retrieval were complete. Impression:            - One 3 mm polyp in the cecum, removed with a cold                         snare. Resected and retrieved.                        - Three 4 to 7 mm polyps in the ascending  colon,                         removed with a hot snare. Resected and retrieved.                        - One 7 mm polyp in the transverse colon, removed with                         a cold snare. Resected and retrieved.                        - One 9 mm polyp in the transverse colon, removed with                         a hot snare. Resected and retrieved.                        - One 6 mm polyp in the descending colon, removed with                         a cold snare. Resected and retrieved.                        - Eight 3 to 8 mm polyps in the sigmoid colon, removed                         with a cold snare. Resected and retrieved.                        - Two 4 to 7 mm polyps in the rectum, removed with a                         hot snare. Resected and retrieved. Recommendation:        - Discharge patient to home.                        - Resume previous diet.                        - Continue present medications.                        - Await pathology results.                        - Repeat colonoscopy in 2 years for surveillance. Procedure Code(s):     --- Professional ---  45385, Colonoscopy, flexible; with removal of                         tumor(s), polyp(s), or other lesion(s) by snare                         technique Diagnosis Code(s):     --- Professional ---                        Z86.010, Personal history of colonic polyps                        K63.5, Polyp of colon                        K62.1, Rectal polyp CPT copyright 2019 American Medical Association. All rights reserved. The codes documented in this report are preliminary and upon coder review may  be revised to meet current compliance requirements. Lucilla Lame MD, MD 01/11/2019 8:54:28 AM This report has been signed electronically. Number of Addenda: 0 Note Initiated On: 01/11/2019 8:14 AM Scope Withdrawal Time: 0 hours 24 minutes 43 seconds  Total Procedure Duration: 0 hours 28  minutes 9 seconds  Estimated Blood Loss:  Estimated blood loss: none.      [facility]

## 2019-01-11 NOTE — H&P (Signed)
Lucilla Lame, MD [facility] [address], [place], [place] [address] Phone:[phone] Fax : [phone]  Primary Care Physician:  [facility] Primary Gastroenterologist:  Dr. Allen Norris  Pre-Procedure History & Physical: HPI:  Mitchell Robbins is a 63 y.o. male is here for an colonoscopy.   Past Medical History:  Diagnosis Date  . Hypertension     Past Surgical History:  Procedure Laterality Date  . COLONOSCOPY WITH PROPOFOL N/A 01/21/2018   Procedure: COLONOSCOPY WITH BIOPSIES;  Surgeon: Lucilla Lame, MD;  Location: [facility];  Service: Endoscopy;  Laterality: N/A;  . ESOPHAGOGASTRODUODENOSCOPY (EGD) WITH PROPOFOL N/A 12/24/2017   Procedure: ESOPHAGOGASTRODUODENOSCOPY (EGD) WITH PROPOFOL;  Surgeon: Lucilla Lame, MD;  Location: [facility] ENDOSCOPY;  Service: Endoscopy;  Laterality: N/A;  . ESOPHAGOGASTRODUODENOSCOPY (EGD) WITH PROPOFOL N/A 01/21/2018   Procedure: ESOPHAGOGASTRODUODENOSCOPY (EGD) WITH BIOPSIES;  Surgeon: Lucilla Lame, MD;  Location: [facility];  Service: Endoscopy;  Laterality: N/A;  . POLYPECTOMY N/A 01/21/2018   Procedure: POLYPECTOMY;  Surgeon: Lucilla Lame, MD;  Location: [facility];  Service: Endoscopy;  Laterality: N/A;  . THYROIDECTOMY      Prior to Admission medications   Medication Sig Start Date End Date Taking? Authorizing Provider  amLODipine (NORVASC) 10 MG tablet 1 BY MOUTH DAILY FOR HIGH BLOOD PRESSURE 09/21/17   [provider]  aspirin 81 MG tablet 1 BY MOUTH DAILY FOR HEART PROTECTION 10/22/17   [provider]  bisacodyl (DULCOLAX) 5 MG EC tablet Take 1 tablet (5 mg total) by mouth daily as needed for moderate constipation. Patient not taking: Reported on 01/12/2018 12/25/17   Vaughan Basta, MD  hydrochlorothiazide (HYDRODIURIL) 50 MG tablet Take by mouth. 05/16/14   [provider]  HYDROcodone-acetaminophen (NORCO/VICODIN) 5-325 MG tablet Take 1 tablet by mouth every 6  (six) hours as needed for moderate pain. Patient not taking: Reported on 01/12/2018 09/30/15   Lisa Roca, MD  lisinopril (PRINIVIL,ZESTRIL) 20 MG tablet Take 20 mg by mouth daily.    [provider]  ondansetron (ZOFRAN ODT) 4 MG disintegrating tablet Take 1 tablet (4 mg total) by mouth every 8 (eight) hours as needed for nausea or vomiting. Patient not taking: Reported on 01/12/2018 01/04/18   Rudene Re, MD  oxyCODONE-acetaminophen (PERCOCET) 5-325 MG tablet Take 1 tablet by mouth every 4 (four) hours as needed for severe pain. Patient not taking: Reported on 01/12/2018 01/04/18   Rudene Re, MD  pantoprazole (PROTONIX) 40 MG tablet Take 1 tablet (40 mg total) by mouth 2 (two) times daily before a meal. Patient not taking: Reported on 01/12/2018 12/25/17 12/25/18  Vaughan Basta, MD  predniSONE (STERAPRED UNI-PAK 21 TAB) 10 MG (21) TBPK tablet Dispense taper pack as directed Patient not taking: Reported on 05/20/2018 01/18/18   Earleen Newport, MD  senna-docusate (SENOKOT-S) 8.6-50 MG tablet Take 1 tablet by mouth at bedtime as needed for mild constipation. Patient not taking: Reported on 01/12/2018 12/25/17   Vaughan Basta, MD    Allergies as of 12/22/2018  . (No Known Allergies)    Family History  Problem Relation Age of Onset  . Hypertension Mother     Social History   Socioeconomic History  . Marital status: Single    Spouse name: Not on file  . Number of children: Not on file  . Years of education: Not on file  . Highest education level: Not on file  Occupational History  . Occupation: truck Animator Needs  . Financial resource strain: Not  on file  . Food insecurity    Worry: Not on file    Inability: Not on file  . Transportation needs    Medical: Not on file    Non-medical: Not on file  Tobacco Use  . Smoking status: Current Every Day Smoker    Packs/day: 1.00    Years: 40.00    Pack years: 40.00  . Smokeless  tobacco: Never Used  Substance and Sexual Activity  . Alcohol use: No    Comment: quit drinking alcohol long time ago  . Drug use: No  . Sexual activity: Not on file  Lifestyle  . Physical activity    Days per week: Not on file    Minutes per session: Not on file  . Stress: Not on file  Relationships  . Social Herbalist on phone: Not on file    Gets together: Not on file    Attends religious service: Not on file    Active member of club or organization: Not on file    Attends meetings of clubs or organizations: Not on file    Relationship status: Not on file  . Intimate partner violence    Fear of current or ex partner: Not on file    Emotionally abused: Not on file    Physically abused: Not on file    Forced sexual activity: Not on file  Other Topics Concern  . Not on file  Social History Narrative  . Not on file    Review of Systems: See HPI, otherwise negative ROS  Physical Exam: There were no vitals taken for this visit. General:   Alert,  pleasant and cooperative in NAD Head:  Normocephalic and atraumatic. Neck:  Supple; no masses or thyromegaly. Lungs:  Clear throughout to auscultation.    Heart:  Regular rate and rhythm. Abdomen:  Soft, nontender and nondistended. Normal bowel sounds, without guarding, and without rebound.   Neurologic:  Alert and  oriented x4;  grossly normal neurologically.  Impression/Plan: Mitchell Robbins is here for an colonoscopy to be performed for history of colon polyps that were adenomatous 01/21/2018  Risks, benefits, limitations, and alternatives regarding  colonoscopy have been reviewed with the patient.  Questions have been answered.  All parties agreeable.   Lucilla Lame, MD  01/11/2019, 7:49 AM

## 2019-01-11 NOTE — Transfer of Care (Signed)
Immediate Anesthesia Transfer of Care Note  Patient: Mitchell Robbins  Procedure(s) Performed: COLONOSCOPY WITH PROPOFOL (N/A )  Patient Location: PACU  Anesthesia Type:General  Level of Consciousness: sedated  Airway & Oxygen Therapy: Patient Spontanous Breathing and Patient connected to nasal cannula oxygen  Post-op Assessment: Report given to RN and Post -op Vital signs reviewed and stable  Post vital signs: Reviewed and stable  Last Vitals:  Vitals Value Taken Time  BP 148/91 01/11/19 0856  Temp 36.7 C 01/11/19 0856  Pulse 91 01/11/19 0856  Resp 29 01/11/19 0856  SpO2 90 % 01/11/19 0856    Last Pain:  Vitals:   01/11/19 0856  TempSrc: Temporal         Complications: No apparent anesthesia complications

## 2019-01-12 LAB — SURGICAL PATHOLOGY

## 2019-01-13 ENCOUNTER — Encounter: Payer: Self-pay | Admitting: Gastroenterology

## 2019-01-17 ENCOUNTER — Encounter: Payer: Self-pay | Admitting: Gastroenterology

## 2019-05-15 ENCOUNTER — Encounter: Payer: Self-pay | Admitting: *Deleted

## 2019-05-15 ENCOUNTER — Emergency Department
Admission: EM | Admit: 2019-05-15 | Discharge: 2019-05-16 | Disposition: A | Discharge status: Home / Self Care | Payer: Self-pay | Attending: Emergency Medicine | Admitting: Emergency Medicine

## 2019-05-15 ENCOUNTER — Other Ambulatory Visit: Payer: Self-pay

## 2019-05-15 DIAGNOSIS — F17210 Nicotine dependence, cigarettes, uncomplicated: Secondary | ICD-10-CM | POA: Insufficient documentation

## 2019-05-15 DIAGNOSIS — Z7982 Long term (current) use of aspirin: Secondary | ICD-10-CM | POA: Insufficient documentation

## 2019-05-15 DIAGNOSIS — Z79899 Other long term (current) drug therapy: Secondary | ICD-10-CM | POA: Insufficient documentation

## 2019-05-15 DIAGNOSIS — I10 Essential (primary) hypertension: Secondary | ICD-10-CM

## 2019-05-15 LAB — BASIC METABOLIC PANEL
Anion gap: 6 (ref 5–15)
BUN: 11 mg/dL (ref 8–23)
CO2: 28 mmol/L (ref 22–32)
Calcium: 8.8 mg/dL — ABNORMAL LOW (ref 8.9–10.3)
Chloride: 105 mmol/L (ref 98–111)
Creatinine, Ser: 1.2 mg/dL (ref 0.61–1.24)
GFR calc Af Amer: >60 mL/min (ref >60)
GFR calc non Af Amer: >60 mL/min (ref >60)
Glucose, Bld: 109 mg/dL — ABNORMAL HIGH (ref 70–99)
Potassium: 3.4 mmol/L — ABNORMAL LOW (ref 3.5–5.1)
Sodium: 139 mmol/L (ref 135–145)

## 2019-05-15 LAB — CBC
HCT: 45.4 % (ref 39.0–52.0)
Hemoglobin: 14.9 g/dL (ref 13.0–17.0)
MCH: 27.4 pg (ref 26.0–34.0)
MCHC: 32.8 g/dL (ref 30.0–36.0)
MCV: 83.5 fL (ref 80.0–100.0)
Platelets: 258 10*3/uL (ref 150–400)
RBC: 5.44 MIL/uL (ref 4.22–5.81)
RDW: 15.7 % — ABNORMAL HIGH (ref 11.5–15.5)
WBC: 9.4 10*3/uL (ref 4.0–10.5)
nRBC: 0 % (ref 0.0–0.2)

## 2019-05-15 LAB — TROPONIN I (HIGH SENSITIVITY): Troponin I (High Sensitivity): 15 ng/L (ref <18)

## 2019-05-15 MED ORDER — CLONIDINE HCL 0.1 MG PO TABS
0.1000 mg | ORAL_TABLET | Freq: Once | ORAL | Status: AC
  Administered 2019-05-15: 0.1 mg via ORAL

## 2019-05-15 NOTE — ED Triage Notes (Signed)
Pt to ED reporting HTN for the past couple weeks. Pt has been taking medications as prescribed but has not been able to lower BP. No weakness or dizziness. Intermittent headaches without light or noise sensitivity, nausea or changes in vision.   No chest pain or SOB.

## 2019-05-15 NOTE — ED Provider Notes (Signed)
[facility] Emergency Department Provider Note  ____________________________________________   First MD Initiated Contact with Patient 05/15/19 2320     (approximate)  I have reviewed the triage vital signs and the nursing notes.   HISTORY  Chief Complaint Hypertension and Headache    HPI Shamel Abidi is a 64 y.o. male with below list of previous medical conditions including hypertension presents to the emergency department secondary to elevated blood pressure which patient states has been occurring for "a while".  When asked to elaborate on the period of time patient states months.  Patient currently prescribed lisinopril 40 mg daily and hydrochlorothiazide 50 mg daily which he states that he has been taking without improvement his blood pressure.  Patient currently has no complaints.  Patient denies any headache no nausea vomiting.  Patient denies any visual changes.  Patient denies any weakness no numbness or gait instability.  Patient denies any chest pain or shortness of breath.       Past Medical History:  Diagnosis Date  . Hypertension     Patient Active Problem List   Diagnosis Date Noted  . History of colonic polyps   . Polyp of colon   . Special screening for malignant neoplasms, colon   . Polyp of sigmoid colon   . Benign neoplasm of transverse colon   . Benign neoplasm of cecum   . Personal history of peptic ulcer disease   . PUD (peptic ulcer disease)   . Polyp of duodenum   . Duodenal ulceration   . Acute gastritis without hemorrhage   . Melena 12/23/2017  . Cellulitis 10/01/2015  . Hypertension 01/29/2012    Past Surgical History:  Procedure Laterality Date  . COLONOSCOPY WITH PROPOFOL N/A 01/21/2018   Procedure: COLONOSCOPY WITH BIOPSIES;  Surgeon: Lucilla Lame, MD;  Location: [place];  Service: Endoscopy;  Laterality: N/A;  . COLONOSCOPY WITH PROPOFOL N/A 01/11/2019   Procedure: COLONOSCOPY WITH PROPOFOL;   Surgeon: Lucilla Lame, MD;  Location: [facility] ENDOSCOPY;  Service: Endoscopy;  Laterality: N/A;  . ESOPHAGOGASTRODUODENOSCOPY (EGD) WITH PROPOFOL N/A 12/24/2017   Procedure: ESOPHAGOGASTRODUODENOSCOPY (EGD) WITH PROPOFOL;  Surgeon: Lucilla Lame, MD;  Location: [facility] ENDOSCOPY;  Service: Endoscopy;  Laterality: N/A;  . ESOPHAGOGASTRODUODENOSCOPY (EGD) WITH PROPOFOL N/A 01/21/2018   Procedure: ESOPHAGOGASTRODUODENOSCOPY (EGD) WITH BIOPSIES;  Surgeon: Lucilla Lame, MD;  Location: [place];  Service: Endoscopy;  Laterality: N/A;  . POLYPECTOMY N/A 01/21/2018   Procedure: POLYPECTOMY;  Surgeon: Lucilla Lame, MD;  Location: [place];  Service: Endoscopy;  Laterality: N/A;  . THYROIDECTOMY      Prior to Admission medications   Medication Sig Start Date End Date Taking? Authorizing Provider  hydrochlorothiazide (HYDRODIURIL) 50 MG tablet Take by mouth. 05/16/14  Yes [provider]  lisinopril (PRINIVIL,ZESTRIL) 20 MG tablet Take 40 mg by mouth daily.    Yes [provider]  amLODipine (NORVASC) 10 MG tablet Take 1 tablet (10 mg total) by mouth daily. 05/16/19 05/15/20  Gregor Hams, MD  amLODipine (NORVASC) 10 MG tablet Take 1 tablet (10 mg total) by mouth daily. 05/16/19   Gregor Hams, MD  aspirin 81 MG tablet 1 BY MOUTH DAILY FOR HEART PROTECTION 10/22/17   [provider]  bisacodyl (DULCOLAX) 5 MG EC tablet Take 1 tablet (5 mg total) by mouth daily as needed for moderate constipation. Patient not taking: Reported on 01/12/2018 12/25/17   Vaughan Basta, MD  HYDROcodone-acetaminophen (NORCO/VICODIN) 5-325 MG tablet Take 1 tablet by mouth  every 6 (six) hours as needed for moderate pain. Patient not taking: Reported on 01/12/2018 09/30/15   Lisa Roca, MD  ondansetron (ZOFRAN ODT) 4 MG disintegrating tablet Take 1 tablet (4 mg total) by mouth every 8 (eight) hours as needed for nausea or vomiting. Patient not taking: Reported on 01/12/2018  01/04/18   Rudene Re, MD  oxyCODONE-acetaminophen (PERCOCET) 5-325 MG tablet Take 1 tablet by mouth every 4 (four) hours as needed for severe pain. Patient not taking: Reported on 01/12/2018 01/04/18   Rudene Re, MD  pantoprazole (PROTONIX) 40 MG tablet Take 1 tablet (40 mg total) by mouth 2 (two) times daily before a meal. Patient not taking: Reported on 01/12/2018 12/25/17 12/25/18  Vaughan Basta, MD  predniSONE (STERAPRED UNI-PAK 21 TAB) 10 MG (21) TBPK tablet Dispense taper pack as directed Patient not taking: Reported on 05/20/2018 01/18/18   Earleen Newport, MD  senna-docusate (SENOKOT-S) 8.6-50 MG tablet Take 1 tablet by mouth at bedtime as needed for mild constipation. Patient not taking: Reported on 01/12/2018 12/25/17   Vaughan Basta, MD    Allergies Patient has no known allergies.  Family History  Problem Relation Age of Onset  . Hypertension Mother     Social History Social History   Tobacco Use  . Smoking status: Current Every Day Smoker    Packs/day: 1.00    Years: 40.00    Pack years: 40.00  . Smokeless tobacco: Never Used  Substance Use Topics  . Alcohol use: No    Comment: quit drinking alcohol long time ago  . Drug use: No    Review of Systems Constitutional: No fever/chills Eyes: No visual changes. ENT: No sore throat. Cardiovascular: Denies chest pain. Respiratory: Denies shortness of breath. Gastrointestinal: No abdominal pain.  No nausea, no vomiting.  No diarrhea.  No constipation. Genitourinary: Negative for dysuria. Musculoskeletal: Negative for neck pain.  Negative for back pain. Integumentary: Negative for rash. Neurological: Negative for headaches, focal weakness or numbness.   ____________________________________________   PHYSICAL EXAM:  VITAL SIGNS: ED Triage Vitals  Enc Vitals Group     BP 05/15/19 2055 (!) 200/104     Pulse Rate 05/15/19 2055 84     Resp 05/15/19 2055 16     Temp 05/15/19  2055 98.7 F (37.1 C)     Temp Source 05/15/19 2055 Oral     SpO2 05/15/19 2055 98 %     Weight 05/15/19 2059 113.4 kg (250 lb)     Height 05/15/19 2059 1.829 m (6')     Head Circumference --      Peak Flow --      Pain Score 05/15/19 2059 0     Pain Loc --      Pain Edu? --      Excl. in [place]? --     Constitutional: Alert and oriented.  Eyes: Conjunctivae are normal.  Mouth/Throat: Patient is wearing a mask. Neck: No stridor.  No meningeal signs.   Cardiovascular: Normal rate, regular rhythm. Good peripheral circulation. Grossly normal heart sounds. Respiratory: Normal respiratory effort.  No retractions. Gastrointestinal: Soft and nontender. No distention.  Musculoskeletal: No lower extremity tenderness nor edema. No gross deformities of extremities. Neurologic:  Normal speech and language. No gross focal neurologic deficits are appreciated.  Skin:  Skin is warm, dry and intact. Psychiatric: Mood and affect are normal. Speech and behavior are normal.  ____________________________________________   LABS (all labs ordered are listed, but only abnormal results are displayed)  Labs  Reviewed  BASIC METABOLIC PANEL - Abnormal; Notable for the following components:      Result Value   Potassium 3.4 (*)    Glucose, Bld 109 (*)    Calcium 8.8 (*)    All other components within normal limits  CBC - Abnormal; Notable for the following components:   RDW 15.7 (*)    All other components within normal limits  TROPONIN I (HIGH SENSITIVITY)   ____________________________________________  EKG  ED ECG REPORT I, Sparta N Zacari Stiff, the attending physician, personally viewed and interpreted this ECG.   Date: 05/15/2019  EKG Time: 9:05 PM  Rate: 86  Rhythm: Normal sinus rhythm  Axis: Normal  Intervals: Normal  ST&T Change: None  ____________________________________________    Procedures   ____________________________________________   INITIAL IMPRESSION / MDM / ASSESSMENT  AND PLAN / ED COURSE  As part of my medical decision making, I reviewed the following data within the electronic MEDICAL RECORD NUMBER   65 year old male presented with above-stated history and physical exam secondary to asymptomatic hypertension.  Review of the patient's chart revealed that he was prescribed amlodipine 10 mg tablets in the past and as such I will represcribe this to the patient.  Patient given 0.1 mg of clonidine in the emergency department with blood pressure improvement.  Patient is advised to follow-up with primary care provider ____________________________________________  FINAL CLINICAL IMPRESSION(S) / ED DIAGNOSES  Final diagnoses:  Essential hypertension     MEDICATIONS GIVEN DURING THIS VISIT:  Medications  cloNIDine (CATAPRES) tablet 0.1 mg (0.1 mg Oral Given 05/15/19 2328)     ED Discharge Orders         Ordered    amLODipine (NORVASC) 10 MG tablet  Daily     05/16/19 0056    amLODipine (NORVASC) 10 MG tablet  Daily     05/16/19 0056          *Please note:  Danian Yeoman was evaluated in Emergency Department on 05/16/2019 for the symptoms described in the history of present illness. He was evaluated in the context of the global COVID-19 pandemic, which necessitated consideration that the patient might be at risk for infection with the SARS-CoV-2 virus that causes COVID-19. Institutional protocols and algorithms that pertain to the evaluation of patients at risk for COVID-19 are in a state of rapid change based on information released by regulatory bodies including the CDC and federal and state organizations. These policies and algorithms were followed during the patient's care in the ED.  Some ED evaluations and interventions may be delayed as a result of limited staffing during the pandemic.*  Note:  This document was prepared using Dragon voice recognition software and may include unintentional dictation errors.   Gregor Hams, MD 05/16/19 [id number]

## 2019-05-16 MED ORDER — AMLODIPINE BESYLATE 10 MG PO TABS: 10.0000 mg | ORAL_TABLET | Freq: Every day | ORAL | 1 refills | Status: DC

## 2019-05-16 MED ORDER — AMLODIPINE BESYLATE 10 MG PO TABS: 10.0000 mg | ORAL_TABLET | Freq: Every day | ORAL | 11 refills | Status: DC

## 2019-05-16 NOTE — ED Notes (Signed)
Pt given ice water per pt request.  

## 2019-05-26 ENCOUNTER — Emergency Department
Admission: EM | Admit: 2019-05-26 | Discharge: 2019-05-26 | Disposition: A | Discharge status: Home / Self Care | Payer: Self-pay | Attending: Emergency Medicine | Admitting: Emergency Medicine

## 2019-05-26 ENCOUNTER — Emergency Department: Payer: Self-pay

## 2019-05-26 ENCOUNTER — Encounter: Payer: Self-pay | Admitting: Emergency Medicine

## 2019-05-26 ENCOUNTER — Other Ambulatory Visit: Payer: Self-pay

## 2019-05-26 DIAGNOSIS — F17210 Nicotine dependence, cigarettes, uncomplicated: Secondary | ICD-10-CM | POA: Insufficient documentation

## 2019-05-26 DIAGNOSIS — R2232 Localized swelling, mass and lump, left upper limb: Secondary | ICD-10-CM | POA: Insufficient documentation

## 2019-05-26 DIAGNOSIS — M7989 Other specified soft tissue disorders: Secondary | ICD-10-CM

## 2019-05-26 DIAGNOSIS — M79642 Pain in left hand: Secondary | ICD-10-CM | POA: Insufficient documentation

## 2019-05-26 DIAGNOSIS — I10 Essential (primary) hypertension: Secondary | ICD-10-CM | POA: Insufficient documentation

## 2019-05-26 LAB — CBC WITH DIFFERENTIAL/PLATELET
Abs Immature Granulocytes: 0.02 10*3/uL (ref 0.00–0.07)
Basophils Absolute: 0.1 10*3/uL (ref 0.0–0.1)
Basophils Relative: 1 %
Eosinophils Absolute: 0.1 10*3/uL (ref 0.0–0.5)
Eosinophils Relative: 1 %
HCT: 43.4 % (ref 39.0–52.0)
Hemoglobin: 14.3 g/dL (ref 13.0–17.0)
Immature Granulocytes: 0 %
Lymphocytes Relative: 41 %
Lymphs Abs: 3.5 10*3/uL (ref 0.7–4.0)
MCH: 27.3 pg (ref 26.0–34.0)
MCHC: 32.9 g/dL (ref 30.0–36.0)
MCV: 82.8 fL (ref 80.0–100.0)
Monocytes Absolute: 0.4 10*3/uL (ref 0.1–1.0)
Monocytes Relative: 5 %
Neutro Abs: 4.5 10*3/uL (ref 1.7–7.7)
Neutrophils Relative %: 52 %
Platelets: 287 10*3/uL (ref 150–400)
RBC: 5.24 MIL/uL (ref 4.22–5.81)
RDW: 15.7 % — ABNORMAL HIGH (ref 11.5–15.5)
WBC: 8.7 10*3/uL (ref 4.0–10.5)
nRBC: 0 % (ref 0.0–0.2)

## 2019-05-26 LAB — BASIC METABOLIC PANEL
Anion gap: 10 (ref 5–15)
BUN: 11 mg/dL (ref 8–23)
CO2: 25 mmol/L (ref 22–32)
Calcium: 8.9 mg/dL (ref 8.9–10.3)
Chloride: 103 mmol/L (ref 98–111)
Creatinine, Ser: 1.03 mg/dL (ref 0.61–1.24)
GFR calc Af Amer: >60 mL/min (ref >60)
GFR calc non Af Amer: >60 mL/min (ref >60)
Glucose, Bld: 111 mg/dL — ABNORMAL HIGH (ref 70–99)
Potassium: 3.7 mmol/L (ref 3.5–5.1)
Sodium: 138 mmol/L (ref 135–145)

## 2019-05-26 LAB — URIC ACID: Uric Acid, Serum: 7.6 mg/dL (ref 3.7–8.6)

## 2019-05-26 MED ORDER — CEPHALEXIN 500 MG PO CAPS: 500.0000 mg | ORAL_CAPSULE | Freq: Four times a day (QID) | ORAL | 0 refills | Status: AC

## 2019-05-26 MED ORDER — PREDNISONE 10 MG PO TABS: ORAL_TABLET | ORAL | 0 refills | Status: DC

## 2019-05-26 NOTE — ED Provider Notes (Signed)
[facility] Emergency Department Provider Note  ____________________________________________  Time seen: Approximately 8:38 AM  I have reviewed the triage vital signs and the nursing notes.   HISTORY  Chief Complaint Hand Pain    HPI Mitchell Robbins is a 64 y.o. male that presents to the emergency department for evaluation of left hand pain and swelling for 4 days.  Hannd feels warm.  Patient states that his knee will occasionally get swollen as well but is not swollen currently.  He thinks he may have a history of gout.  No trauma.  No fevers, shortness of breath, chest pain, headaches.  He presented to this emergency department in fall 2019 for similar symptoms.  He was treated for cellulitis on his first visit with clindamycin and states that symptoms did not resolve. He was treated for gout/arthritis with prednisone on his second visit and eventually symptoms imporved.  Patient states that his hand feels the exact same as previous episode.  Patient was seen in this emergency department at the beginning of March for hypertension.  He was started on amlodipine.  He states that he has been compliant with his medications.  No headaches, shortness of breath, chest pain.   Past Medical History:  Diagnosis Date  . Hypertension     Patient Active Problem List   Diagnosis Date Noted  . History of colonic polyps   . Polyp of colon   . Special screening for malignant neoplasms, colon   . Polyp of sigmoid colon   . Benign neoplasm of transverse colon   . Benign neoplasm of cecum   . Personal history of peptic ulcer disease   . PUD (peptic ulcer disease)   . Polyp of duodenum   . Duodenal ulceration   . Acute gastritis without hemorrhage   . Melena 12/23/2017  . Cellulitis 10/01/2015  . Hypertension 01/29/2012    Past Surgical History:  Procedure Laterality Date  . COLONOSCOPY WITH PROPOFOL N/A 01/21/2018   Procedure: COLONOSCOPY WITH BIOPSIES;  Surgeon:  Lucilla Lame, MD;  Location: [place];  Service: Endoscopy;  Laterality: N/A;  . COLONOSCOPY WITH PROPOFOL N/A 01/11/2019   Procedure: COLONOSCOPY WITH PROPOFOL;  Surgeon: Lucilla Lame, MD;  Location: [facility] ENDOSCOPY;  Service: Endoscopy;  Laterality: N/A;  . ESOPHAGOGASTRODUODENOSCOPY (EGD) WITH PROPOFOL N/A 12/24/2017   Procedure: ESOPHAGOGASTRODUODENOSCOPY (EGD) WITH PROPOFOL;  Surgeon: Lucilla Lame, MD;  Location: [facility] ENDOSCOPY;  Service: Endoscopy;  Laterality: N/A;  . ESOPHAGOGASTRODUODENOSCOPY (EGD) WITH PROPOFOL N/A 01/21/2018   Procedure: ESOPHAGOGASTRODUODENOSCOPY (EGD) WITH BIOPSIES;  Surgeon: Lucilla Lame, MD;  Location: [place];  Service: Endoscopy;  Laterality: N/A;  . POLYPECTOMY N/A 01/21/2018   Procedure: POLYPECTOMY;  Surgeon: Lucilla Lame, MD;  Location: [place];  Service: Endoscopy;  Laterality: N/A;  . THYROIDECTOMY      Prior to Admission medications   Medication Sig Start Date End Date Taking? Authorizing Provider  amLODipine (NORVASC) 10 MG tablet Take 1 tablet (10 mg total) by mouth daily. 05/16/19 05/15/20  Gregor Hams, MD  amLODipine (NORVASC) 10 MG tablet Take 1 tablet (10 mg total) by mouth daily. 05/16/19   Gregor Hams, MD  aspirin 81 MG tablet 1 BY MOUTH DAILY FOR HEART PROTECTION 10/22/17   [provider]  cephALEXin (KEFLEX) 500 MG capsule Take 1 capsule (500 mg total) by mouth 4 (four) times daily for 10 days. 05/26/19 06/05/19  Laban Emperor, PA-C  hydrochlorothiazide (HYDRODIURIL) 50 MG tablet Take by mouth. 05/16/14   [provider]  lisinopril (PRINIVIL,ZESTRIL) 20 MG tablet Take 40 mg by mouth daily.     [provider]  predniSONE (DELTASONE) 10 MG tablet Take 6 tablets on day 1, take 5 tablets on day 2, take 4 tablets on day 3, take 3 tablets on day 4, take 2 tablets on day 5, take 1 tablet on day 6 05/26/19   Laban Emperor, PA-C    Allergies Patient has no known allergies.  Family  History  Problem Relation Age of Onset  . Hypertension Mother     Social History Social History   Tobacco Use  . Smoking status: Current Every Day Smoker    Packs/day: 1.00    Years: 40.00    Pack years: 40.00  . Smokeless tobacco: Never Used  Substance Use Topics  . Alcohol use: No    Comment: quit drinking alcohol long time ago  . Drug use: No     Review of Systems  Constitutional: No fever/chills Respiratory: No SOB. Gastrointestinal: No abdominal pain.  No nausea, no vomiting.  Musculoskeletal: Positive for hand pain and swelling. Skin: Negative for abrasions, lacerations, ecchymosis. Neurological: Negative for numbness or tingling   ____________________________________________   PHYSICAL EXAM:  VITAL SIGNS: ED Triage Vitals  Enc Vitals Group     BP 05/26/19 0732 (!) 173/105     Pulse Rate 05/26/19 0732 87     Resp 05/26/19 0732 20     Temp 05/26/19 0732 98.7 F (37.1 C)     Temp Source 05/26/19 0732 Oral     SpO2 05/26/19 0732 96 %     Weight 05/26/19 0729 250 lb (113.4 kg)     Height 05/26/19 0729 6' (1.829 m)     Head Circumference --      Peak Flow --      Pain Score 05/26/19 0729 7     Pain Loc --      Pain Edu? --      Excl. in [place]? --      Constitutional: Alert and oriented. Well appearing and in no acute distress. Eyes: Conjunctivae are normal. PERRL. EOMI. Head: Atraumatic. ENT:      Ears:      Nose: No congestion/rhinnorhea.      Mouth/Throat: Mucous membranes are moist.  Neck: No stridor. Cardiovascular: Normal rate, regular rhythm.  Good peripheral circulation.  Symmetric radial pulses bilaterally. Respiratory: Normal respiratory effort without tachypnea or retractions. Lungs CTAB. Good air entry to the bases with no decreased or absent breath sounds. Musculoskeletal: Full range of motion to all extremities. No gross deformities appreciated.  Moderate swelling and warmth to left hand.  Full range of motion of the left  wrist. Neurologic:  Normal speech and language. No gross focal neurologic deficits are appreciated.  Skin:  Skin is warm, dry and intact. No rash noted. Psychiatric: Mood and affect are normal. Speech and behavior are normal. Patient exhibits appropriate insight and judgement.   ____________________________________________   LABS (all labs ordered are listed, but only abnormal results are displayed)  Labs Reviewed  CBC WITH DIFFERENTIAL/PLATELET - Abnormal; Notable for the following components:      Result Value   RDW 15.7 (*)    All other components within normal limits  BASIC METABOLIC PANEL - Abnormal; Notable for the following components:   Glucose, Bld 111 (*)    All other components within normal limits  URIC ACID   ____________________________________________  EKG   ____________________________________________  RADIOLOGY Robinette Haines, personally  viewed and evaluated these images (plain radiographs) as part of my medical decision making, as well as reviewing the written report by the radiologist.  US Venous Img Upper Uni Left  Result Date: 05/26/2019 CLINICAL DATA:  Upper extremity edema EXAM: LEFT UPPER EXTREMITY VENOUS DUPLEX ULTRASOUND TECHNIQUE: Gray-scale sonography with graded compression, as well as color Doppler and duplex ultrasound were performed to evaluate the left upper extremity deep venous system from the level of the subclavian vein and including the jugular, axillary, basilic, radial, ulnar and upper cephalic vein. Spectral Doppler was utilized to evaluate flow at rest and with distal augmentation maneuvers. COMPARISON:  January 04, 2018 FINDINGS: Contralateral Subclavian Vein: Respiratory phasicity is normal and symmetric with the symptomatic side. No evidence of thrombus. Normal compressibility. Internal Jugular Vein: No evidence of thrombus. Normal compressibility, respiratory phasicity and response to augmentation. Subclavian Vein: No evidence of  thrombus. Normal compressibility, respiratory phasicity and response to augmentation. Axillary Vein: No evidence of thrombus. Normal compressibility, respiratory phasicity and response to augmentation. Cephalic Vein: No evidence of thrombus. Normal compressibility, respiratory phasicity and response to augmentation. Basilic Vein: No evidence of thrombus. Normal compressibility, respiratory phasicity and response to augmentation. Brachial Veins: No evidence of thrombus. Normal compressibility, respiratory phasicity and response to augmentation. Radial Veins: No evidence of thrombus. Normal compressibility, respiratory phasicity and response to augmentation. Ulnar Veins: No evidence of thrombus. Normal compressibility, respiratory phasicity and response to augmentation. Venous Reflux:  None visualized. Other Findings:  None visualized. IMPRESSION: No evidence of deep venous thrombosis within the left upper extremity. Right subclavian vein also patent. Electronically Signed   By: Lowella Grip III M.D.   On: 05/26/2019 10:38   DG Hand Complete Left  Result Date: 05/26/2019 CLINICAL DATA:  Generalized soft tissue swelling EXAM: LEFT HAND - COMPLETE 3+ VIEW COMPARISON:  January 04, 2018 FINDINGS: Frontal, oblique, and lateral views were obtained. There is diffuse soft tissue swelling throughout the hand region. No radiopaque foreign body or soft tissue air evident. There is no fracture or dislocation. There is stable relatively mild osteoarthritic change in all PIP and DIP joints. No erosive changes. IMPRESSION: Diffuse soft tissue swelling throughout the hand region without radiopaque foreign body or soft tissue air. Relatively mild narrowing of multiple distal joints. No fracture or dislocation. No evident bony destruction or erosion. Electronically Signed   By: Lowella Grip III M.D.   On: 05/26/2019 08:56    ____________________________________________    PROCEDURES  Procedure(s) performed:     Procedures    Medications - No data to display   ____________________________________________   INITIAL IMPRESSION / ASSESSMENT AND PLAN / ED COURSE  Pertinent labs & imaging results that were available during my care of the patient were reviewed by me and considered in my medical decision making (see chart for details).  Review of the [place] CSRS was performed in accordance of the [place] prior to dispensing any controlled drugs.  DDx: Cellulitis, gout, arthritis, DVT, fracture, septic joint  Patient presented to the emergency department for evaluation of left hand swelling for 4 days.  Vital signs and exam are reassuring.  No leukocytosis.  Uric acid is not elevated.  Ultrasound negative for DVT.  Hand x-ray negative for acute bony abnormalities. Exam is most consistent with cellulitis. Symptoms did not improve with antibiotics, however, during previous episode. Low suspicion for septic joint, as patient does not have any systemic symptoms and patient has good movement in wrist and hand. Patient may have got but hand  is not very painful. Patient will be covered for cellulitis with Keflex and arthritis and gout with prednisone.  Patient will be discharged home with prescriptions for keflex and prednisone. Patient has been compliant with blood pressure medication.  He denies headache, shortness of breath, chest pain.  He was recently started on amlodipine and will continue this.   Patient is to follow up with PCP as directed. Patient is given ED precautions to return to the ED for any worsening or new symptoms.  Solan Lammers was evaluated in Emergency Department on 05/26/2019 for the symptoms described in the history of present illness. He was evaluated in the context of the global COVID-19 pandemic, which necessitated consideration that the patient might be at risk for infection with the SARS-CoV-2 virus that causes COVID-19. Institutional protocols and algorithms that pertain to the evaluation of  patients at risk for COVID-19 are in a state of rapid change based on information released by regulatory bodies including the CDC and federal and state organizations. These policies and algorithms were followed during the patient's care in the ED.   ____________________________________________  FINAL CLINICAL IMPRESSION(S) / ED DIAGNOSES  Final diagnoses:  Swelling of left hand      NEW MEDICATIONS STARTED DURING THIS VISIT:  ED Discharge Orders         Ordered    cephALEXin (KEFLEX) 500 MG capsule  4 times daily     05/26/19 1104    predniSONE (DELTASONE) 10 MG tablet     05/26/19 1104              This chart was dictated using voice recognition software/Dragon. Despite best efforts to proofread, errors can occur which can change the meaning. Any change was purely unintentional.    Laban Emperor, PA-C 05/26/19 1431    Blake Divine, MD 05/26/19 1455

## 2019-05-26 NOTE — ED Notes (Signed)
See triage note  Presents with pain and swelling to left hand.  States this started about 4 days ago  Denies any recent injury  Swelling noted to hand and wrist area

## 2019-05-26 NOTE — ED Triage Notes (Signed)
Pt with HTN in triage, reports has hx of the same and recently had a medication change for better control

## 2019-05-26 NOTE — ED Triage Notes (Signed)
Pt reports swelling to left hand for the past 3-4 days. Pt denies injuries, states this has happened before and they said it was an infection. Swelling noted.

## 2019-10-30 ENCOUNTER — Other Ambulatory Visit: Payer: Self-pay

## 2019-10-30 ENCOUNTER — Encounter: Payer: Self-pay | Admitting: Emergency Medicine

## 2019-10-30 ENCOUNTER — Emergency Department
Admission: EM | Admit: 2019-10-30 | Discharge: 2019-10-30 | Disposition: A | Discharge status: Home / Self Care | Payer: Self-pay | Attending: Emergency Medicine | Admitting: Emergency Medicine

## 2019-10-30 ENCOUNTER — Emergency Department: Payer: Self-pay

## 2019-10-30 DIAGNOSIS — R079 Chest pain, unspecified: Secondary | ICD-10-CM | POA: Insufficient documentation

## 2019-10-30 DIAGNOSIS — E876 Hypokalemia: Secondary | ICD-10-CM | POA: Insufficient documentation

## 2019-10-30 DIAGNOSIS — Z79899 Other long term (current) drug therapy: Secondary | ICD-10-CM | POA: Insufficient documentation

## 2019-10-30 DIAGNOSIS — Z7982 Long term (current) use of aspirin: Secondary | ICD-10-CM | POA: Insufficient documentation

## 2019-10-30 DIAGNOSIS — B0229 Other postherpetic nervous system involvement: Secondary | ICD-10-CM | POA: Insufficient documentation

## 2019-10-30 DIAGNOSIS — I10 Essential (primary) hypertension: Secondary | ICD-10-CM | POA: Insufficient documentation

## 2019-10-30 DIAGNOSIS — F17200 Nicotine dependence, unspecified, uncomplicated: Secondary | ICD-10-CM | POA: Insufficient documentation

## 2019-10-30 LAB — BASIC METABOLIC PANEL
Anion gap: 14 (ref 5–15)
BUN: 16 mg/dL (ref 8–23)
CO2: 26 mmol/L (ref 22–32)
Calcium: 9.5 mg/dL (ref 8.9–10.3)
Chloride: 99 mmol/L (ref 98–111)
Creatinine, Ser: 1.31 mg/dL — ABNORMAL HIGH (ref 0.61–1.24)
GFR calc Af Amer: >60 mL/min (ref >60)
GFR calc non Af Amer: 57 mL/min — ABNORMAL LOW (ref >60)
Glucose, Bld: 215 mg/dL — ABNORMAL HIGH (ref 70–99)
Potassium: 3.1 mmol/L — ABNORMAL LOW (ref 3.5–5.1)
Sodium: 139 mmol/L (ref 135–145)

## 2019-10-30 LAB — TROPONIN I (HIGH SENSITIVITY)
Troponin I (High Sensitivity): 10 ng/L (ref <18)
Troponin I (High Sensitivity): 5 ng/L (ref <18)

## 2019-10-30 LAB — CBC
HCT: 45.3 % (ref 39.0–52.0)
Hemoglobin: 15 g/dL (ref 13.0–17.0)
MCH: 28.6 pg (ref 26.0–34.0)
MCHC: 33.1 g/dL (ref 30.0–36.0)
MCV: 86.5 fL (ref 80.0–100.0)
Platelets: 302 10*3/uL (ref 150–400)
RBC: 5.24 MIL/uL (ref 4.22–5.81)
RDW: 16.5 % — ABNORMAL HIGH (ref 11.5–15.5)
WBC: 10.9 10*3/uL — ABNORMAL HIGH (ref 4.0–10.5)
nRBC: 0 % (ref 0.0–0.2)

## 2019-10-30 MED ORDER — LIDOCAINE 5 % EX PTCH
1.0000 | MEDICATED_PATCH | CUTANEOUS | Status: DC
  Administered 2019-10-30: 1 via TRANSDERMAL
  Filled 2019-10-30: qty 1

## 2019-10-30 MED ORDER — POTASSIUM CHLORIDE CRYS ER 20 MEQ PO TBCR
40.0000 meq | EXTENDED_RELEASE_TABLET | Freq: Once | ORAL | Status: AC
  Administered 2019-10-30: 40 meq via ORAL
  Filled 2019-10-30: qty 2

## 2019-10-30 MED ORDER — GABAPENTIN 100 MG PO CAPS: 100.0000 mg | ORAL_CAPSULE | Freq: Three times a day (TID) | ORAL | 0 refills | Status: DC

## 2019-10-30 NOTE — ED Triage Notes (Signed)
Patient with complaint of left side chest pain times one month.

## 2019-10-30 NOTE — ED Provider Notes (Signed)
[facility] Emergency Department Provider Note  ____________________________________________   First MD Initiated Contact with Patient 10/30/19 1120     (approximate)  I have reviewed the triage vital signs and the nursing notes.   HISTORY  Chief Complaint Chest Pain   HPI Mitchell Robbins is a 64 y.o. male with a past medical history of HTN and recent diagnosis of zoster infection in the left chest and left arm who presents for assessment of persistent pain over the past month. Patient notes he still has some scarring where he had draining nodules earlier in the month but he states he has persistent burning pain. He denies any change or new pain in the last week. Denies any fevers, cough, vomiting, diarrhea, dysuria, back pain, abdominal pain, recent traumatic injuries, or other acute complaints. Endorses tobacco abuse but denies EtOH or illicit drug use. Denies any other acute concerns at this time. No prior similar episodes. No clear alleviating or aggravating factors including Tylenol which he has tried.         Past Medical History:  Diagnosis Date  . Hypertension     Patient Active Problem List   Diagnosis Date Noted  . History of colonic polyps   . Polyp of colon   . Special screening for malignant neoplasms, colon   . Polyp of sigmoid colon   . Benign neoplasm of transverse colon   . Benign neoplasm of cecum   . Personal history of peptic ulcer disease   . PUD (peptic ulcer disease)   . Polyp of duodenum   . Duodenal ulceration   . Acute gastritis without hemorrhage   . Melena 12/23/2017  . Cellulitis 10/01/2015  . Hypertension 01/29/2012    Past Surgical History:  Procedure Laterality Date  . COLONOSCOPY WITH PROPOFOL N/A 01/21/2018   Procedure: COLONOSCOPY WITH BIOPSIES;  Surgeon: Lucilla Lame, MD;  Location: [place];  Service: Endoscopy;  Laterality: N/A;  . COLONOSCOPY WITH PROPOFOL N/A 01/11/2019   Procedure: COLONOSCOPY  WITH PROPOFOL;  Surgeon: Lucilla Lame, MD;  Location: [facility] ENDOSCOPY;  Service: Endoscopy;  Laterality: N/A;  . ESOPHAGOGASTRODUODENOSCOPY (EGD) WITH PROPOFOL N/A 12/24/2017   Procedure: ESOPHAGOGASTRODUODENOSCOPY (EGD) WITH PROPOFOL;  Surgeon: Lucilla Lame, MD;  Location: [facility] ENDOSCOPY;  Service: Endoscopy;  Laterality: N/A;  . ESOPHAGOGASTRODUODENOSCOPY (EGD) WITH PROPOFOL N/A 01/21/2018   Procedure: ESOPHAGOGASTRODUODENOSCOPY (EGD) WITH BIOPSIES;  Surgeon: Lucilla Lame, MD;  Location: [place];  Service: Endoscopy;  Laterality: N/A;  . POLYPECTOMY N/A 01/21/2018   Procedure: POLYPECTOMY;  Surgeon: Lucilla Lame, MD;  Location: [place];  Service: Endoscopy;  Laterality: N/A;  . THYROIDECTOMY      Prior to Admission medications   Medication Sig Start Date End Date Taking? Authorizing Provider  amLODipine (NORVASC) 10 MG tablet Take 1 tablet (10 mg total) by mouth daily. 05/16/19 05/15/20  Gregor Hams, MD  amLODipine (NORVASC) 10 MG tablet Take 1 tablet (10 mg total) by mouth daily. 05/16/19   Gregor Hams, MD  aspirin 81 MG tablet 1 BY MOUTH DAILY FOR HEART PROTECTION 10/22/17   [provider]  gabapentin (NEURONTIN) 100 MG capsule Take 1 capsule (100 mg total) by mouth 3 (three) times daily for 14 days. 10/30/19 11/13/19  Lucrezia Starch, MD  hydrochlorothiazide (HYDRODIURIL) 50 MG tablet Take by mouth. 05/16/14   [provider]  lisinopril (PRINIVIL,ZESTRIL) 20 MG tablet Take 40 mg by mouth daily.     [provider]  predniSONE (DELTASONE) 10 MG  tablet Take 6 tablets on day 1, take 5 tablets on day 2, take 4 tablets on day 3, take 3 tablets on day 4, take 2 tablets on day 5, take 1 tablet on day 6 05/26/19   Laban Emperor, PA-C    Allergies Patient has no known allergies.  Family History  Problem Relation Age of Onset  . Hypertension Mother     Social History Social History   Tobacco Use  . Smoking status: Current Every Day  Smoker    Packs/day: 1.00    Years: 40.00    Pack years: 40.00  . Smokeless tobacco: Never Used  Vaping Use  . Vaping Use: Never used  Substance Use Topics  . Alcohol use: No    Comment: quit drinking alcohol long time ago  . Drug use: No    Review of Systems  Review of Systems  Constitutional: Negative for chills and fever.  HENT: Negative for sore throat.   Eyes: Negative for pain.  Respiratory: Negative for cough and stridor.   Cardiovascular: Positive for chest pain.  Gastrointestinal: Negative for vomiting.  Skin: Negative for rash.  Neurological: Negative for seizures, loss of consciousness and headaches.  Psychiatric/Behavioral: Negative for suicidal ideas.  All other systems reviewed and are negative.     ____________________________________________   PHYSICAL EXAM:  VITAL SIGNS: ED Triage Vitals  Enc Vitals Group     BP 10/30/19 0652 (!) 147/93     Pulse Rate 10/30/19 0652 81     Resp 10/30/19 0652 17     Temp 10/30/19 0652 98.8 F (37.1 C)     Temp Source 10/30/19 0652 Oral     SpO2 10/30/19 0652 100 %     Weight 10/30/19 0654 256 lb (116.1 kg)     Height 10/30/19 0654 5\' 11"  (1.803 m)     Head Circumference --      Peak Flow --      Pain Score 10/30/19 0656 4     Pain Loc --      Pain Edu? --      Excl. in [place]? --    Vitals:   10/30/19 1130 10/30/19 1149  BP: 139/82   Pulse: 60   Resp: (!) 29   Temp:  98.6 F (37 C)  SpO2: 95%    Physical Exam Vitals and nursing note reviewed.  Constitutional:      Appearance: He is well-developed.  HENT:     Head: Normocephalic and atraumatic.     Right Ear: External ear normal.     Left Ear: External ear normal.     Nose: Nose normal.  Eyes:     Conjunctiva/sclera: Conjunctivae normal.  Cardiovascular:     Rate and Rhythm: Normal rate and regular rhythm.     Heart sounds: No murmur heard.   Pulmonary:     Effort: Pulmonary effort is normal. No respiratory distress.     Breath sounds: Normal  breath sounds.  Abdominal:     Palpations: Abdomen is soft.     Tenderness: There is no abdominal tenderness.  Musculoskeletal:     Cervical back: Neck supple.  Skin:    General: Skin is warm and dry.     Capillary Refill: Capillary refill takes less than 2 seconds.  Neurological:     Mental Status: He is alert and oriented to person, place, and time.  Psychiatric:        Mood and Affect: Mood normal.  ____________________________________________   LABS (all labs ordered are listed, but only abnormal results are displayed)  Labs Reviewed  BASIC METABOLIC PANEL - Abnormal; Notable for the following components:      Result Value   Potassium 3.1 (*)    Glucose, Bld 215 (*)    Creatinine, Ser 1.31 (*)    GFR calc non Af Amer 57 (*)    All other components within normal limits  CBC - Abnormal; Notable for the following components:   WBC 10.9 (*)    RDW 16.5 (*)    All other components within normal limits  TROPONIN I (HIGH SENSITIVITY)  TROPONIN I (HIGH SENSITIVITY)   ____________________________________________  EKG  Sinus rhythm with a ventricular rate of 80, normal axis, unremarkable intervals, no clear evidence of acute ischemia or other significant underlying arrhythmia. ____________________________________________  RADIOLOGY  ED MD interpretation: Unremarkable.  Official radiology report(s): DG Chest 2 View  Result Date: 10/30/2019 CLINICAL DATA:  Chest pain EXAM: CHEST - 2 VIEW COMPARISON:  12/23/2017 FINDINGS: Overall normal heart size although prominent left ventricular contour. Aortic tortuosity. Minimal linear scarring. No edema, effusion, or pneumothorax. IMPRESSION: Stable chest.  No evidence of acute disease. Electronically Signed   By: Monte Fantasia M.D.   On: 10/30/2019 07:59    ____________________________________________   PROCEDURES  Procedure(s) performed (including Critical  Care):  Procedures   ____________________________________________   INITIAL IMPRESSION / ASSESSMENT AND PLAN / ED COURSE        Overall patient's history, exam, and ED work-up is most consistent with likely postherpetic neuralgia from recent herpes zoster infection. Patient is afebrile hemodynamically stable on arrival. There is no evidence on chest x-ray of pneumonia, rib fracture, volume overload, or other acute intrathoracic process. I have low suspicion for ACS given duration of symptoms, reassuring EKG, and nonelevated troponin. CBC and BMP are unremarkable with the exception of mild hypokalemia.. Patient's presentation is not consistent with dissection, ruptured viscus, or other immediate life-threatening pathology. Discussed with patient expected clinical course and lidocaine patch applied. Rx written for gabapentin. Patient instructed to follow-up with his PCP in the next 3 to 5 days. Discharged stable condition. Strict return precautions advised and discussed.  Medications  lidocaine (LIDODERM) 5 % 1 patch (1 patch Transdermal Patch Applied 10/30/19 1145)  potassium chloride SA (KLOR-CON) CR tablet 40 mEq (40 mEq Oral Given 10/30/19 1144)    ____________________________________________   FINAL CLINICAL IMPRESSION(S) / ED DIAGNOSES  Final diagnoses:  HZV (herpes zoster virus) post herpetic neuralgia  Hypokalemia     ED Discharge Orders         Ordered    gabapentin (NEURONTIN) 100 MG capsule  3 times daily        10/30/19 1138           Note:  This document was prepared using Dragon voice recognition software and may include unintentional dictation errors.   Lucrezia Starch, MD 10/30/19 [phone]

## 2019-12-12 DIAGNOSIS — E118 Type 2 diabetes mellitus with unspecified complications: Secondary | ICD-10-CM | POA: Insufficient documentation

## 2020-02-19 ENCOUNTER — Emergency Department
Admission: EM | Admit: 2020-02-19 | Discharge: 2020-02-20 | Disposition: A | Discharge status: Home / Self Care | Payer: Self-pay | Attending: Emergency Medicine | Admitting: Emergency Medicine

## 2020-02-19 ENCOUNTER — Other Ambulatory Visit: Payer: Self-pay

## 2020-02-19 ENCOUNTER — Emergency Department: Payer: Self-pay

## 2020-02-19 ENCOUNTER — Encounter: Payer: Self-pay | Admitting: Emergency Medicine

## 2020-02-19 DIAGNOSIS — F17200 Nicotine dependence, unspecified, uncomplicated: Secondary | ICD-10-CM | POA: Insufficient documentation

## 2020-02-19 DIAGNOSIS — Z7982 Long term (current) use of aspirin: Secondary | ICD-10-CM | POA: Insufficient documentation

## 2020-02-19 DIAGNOSIS — Z79899 Other long term (current) drug therapy: Secondary | ICD-10-CM | POA: Insufficient documentation

## 2020-02-19 DIAGNOSIS — M79602 Pain in left arm: Secondary | ICD-10-CM | POA: Insufficient documentation

## 2020-02-19 DIAGNOSIS — R0789 Other chest pain: Secondary | ICD-10-CM | POA: Insufficient documentation

## 2020-02-19 DIAGNOSIS — I10 Essential (primary) hypertension: Secondary | ICD-10-CM | POA: Insufficient documentation

## 2020-02-19 LAB — BASIC METABOLIC PANEL
Anion gap: 10 (ref 5–15)
BUN: 17 mg/dL (ref 8–23)
CO2: 26 mmol/L (ref 22–32)
Calcium: 8.9 mg/dL (ref 8.9–10.3)
Chloride: 101 mmol/L (ref 98–111)
Creatinine, Ser: 1.28 mg/dL — ABNORMAL HIGH (ref 0.61–1.24)
GFR, Estimated: >60 mL/min (ref >60)
Glucose, Bld: 109 mg/dL — ABNORMAL HIGH (ref 70–99)
Potassium: 3.4 mmol/L — ABNORMAL LOW (ref 3.5–5.1)
Sodium: 137 mmol/L (ref 135–145)

## 2020-02-19 LAB — CBC
HCT: 43.9 % (ref 39.0–52.0)
Hemoglobin: 14.4 g/dL (ref 13.0–17.0)
MCH: 28.6 pg (ref 26.0–34.0)
MCHC: 32.8 g/dL (ref 30.0–36.0)
MCV: 87.3 fL (ref 80.0–100.0)
Platelets: 262 10*3/uL (ref 150–400)
RBC: 5.03 MIL/uL (ref 4.22–5.81)
RDW: 15 % (ref 11.5–15.5)
WBC: 10.1 10*3/uL (ref 4.0–10.5)
nRBC: 0 % (ref 0.0–0.2)

## 2020-02-19 LAB — TROPONIN I (HIGH SENSITIVITY)
Troponin I (High Sensitivity): 18 ng/L — ABNORMAL HIGH (ref <18)
Troponin I (High Sensitivity): 18 ng/L — ABNORMAL HIGH (ref <18)

## 2020-02-19 NOTE — ED Triage Notes (Signed)
Pt to ED via POV with c/o L sided chest pain and L arm pain. Pt states "a while back" had shingles. Pt states CP has been ongoing x several months. Pt denies worsening with exertion/denies CP better with rest. Pt states pain radiates to L arm, states aching to L arm "from the elbow up".

## 2020-02-20 MED ORDER — GABAPENTIN 100 MG PO CAPS: 100.0000 mg | ORAL_CAPSULE | Freq: Three times a day (TID) | ORAL | 0 refills | Status: DC

## 2020-02-20 NOTE — ED Provider Notes (Signed)
[facility] Emergency Department Provider Note   ____________________________________________   Event Date/Time   First MD Initiated Contact with Patient 02/19/20 2300     (approximate)  I have reviewed the triage vital signs and the nursing notes.   HISTORY  Chief Complaint Chest Pain    HPI Kainen Struckman is a 64 y.o. male with a stated past medical history of hypertension and recent diagnosis of shingles of the left upper chest and left upper extremity approximately 2 months prior to arrival who presents for continuing left chest and arm pain.  Patient states that this pain is intermittent and has no exacerbating or relieving factors.  Patient states that it can last anywhere from 20 minutes to 6 hours and resolve spontaneously.  Patient has not tried any medications for this pain.  Patient describes burning left chest and left upper extremity pain that radiates into his shoulder and neck occasionally.  Denies any rash, dyspnea on exertion, lower extremity swelling, shortness of breath, or exercise intolerance         Past Medical History:  Diagnosis Date  . Hypertension     Patient Active Problem List   Diagnosis Date Noted  . History of colonic polyps   . Polyp of colon   . Special screening for malignant neoplasms, colon   . Polyp of sigmoid colon   . Benign neoplasm of transverse colon   . Benign neoplasm of cecum   . Personal history of peptic ulcer disease   . PUD (peptic ulcer disease)   . Polyp of duodenum   . Duodenal ulceration   . Acute gastritis without hemorrhage   . Melena 12/23/2017  . Cellulitis 10/01/2015  . Hypertension 01/29/2012    Past Surgical History:  Procedure Laterality Date  . COLONOSCOPY WITH PROPOFOL N/A 01/21/2018   Procedure: COLONOSCOPY WITH BIOPSIES;  Surgeon: Lucilla Lame, MD;  Location: [place];  Service: Endoscopy;  Laterality: N/A;  . COLONOSCOPY WITH PROPOFOL N/A 01/11/2019   Procedure:  COLONOSCOPY WITH PROPOFOL;  Surgeon: Lucilla Lame, MD;  Location: [facility] ENDOSCOPY;  Service: Endoscopy;  Laterality: N/A;  . ESOPHAGOGASTRODUODENOSCOPY (EGD) WITH PROPOFOL N/A 12/24/2017   Procedure: ESOPHAGOGASTRODUODENOSCOPY (EGD) WITH PROPOFOL;  Surgeon: Lucilla Lame, MD;  Location: [facility] ENDOSCOPY;  Service: Endoscopy;  Laterality: N/A;  . ESOPHAGOGASTRODUODENOSCOPY (EGD) WITH PROPOFOL N/A 01/21/2018   Procedure: ESOPHAGOGASTRODUODENOSCOPY (EGD) WITH BIOPSIES;  Surgeon: Lucilla Lame, MD;  Location: [place];  Service: Endoscopy;  Laterality: N/A;  . POLYPECTOMY N/A 01/21/2018   Procedure: POLYPECTOMY;  Surgeon: Lucilla Lame, MD;  Location: [place];  Service: Endoscopy;  Laterality: N/A;  . THYROIDECTOMY      Prior to Admission medications   Medication Sig Start Date End Date Taking? Authorizing Provider  amLODipine (NORVASC) 10 MG tablet Take 1 tablet (10 mg total) by mouth daily. 05/16/19 05/15/20  Gregor Hams, MD  amLODipine (NORVASC) 10 MG tablet Take 1 tablet (10 mg total) by mouth daily. 05/16/19   Gregor Hams, MD  aspirin 81 MG tablet 1 BY MOUTH DAILY FOR HEART PROTECTION 10/22/17   [provider]  gabapentin (NEURONTIN) 100 MG capsule Take 1 capsule (100 mg total) by mouth 3 (three) times daily for 14 days. 02/20/20 03/05/20  Naaman Plummer, MD  hydrochlorothiazide (HYDRODIURIL) 50 MG tablet Take by mouth. 05/16/14   [provider]  lisinopril (PRINIVIL,ZESTRIL) 20 MG tablet Take 40 mg by mouth daily.     [provider]  predniSONE (DELTASONE)  10 MG tablet Take 6 tablets on day 1, take 5 tablets on day 2, take 4 tablets on day 3, take 3 tablets on day 4, take 2 tablets on day 5, take 1 tablet on day 6 05/26/19   Laban Emperor, PA-C    Allergies Patient has no known allergies.  Family History  Problem Relation Age of Onset  . Hypertension Mother     Social History Social History   Tobacco Use  . Smoking status: Current  Every Day Smoker    Packs/day: 1.00    Years: 40.00    Pack years: 40.00  . Smokeless tobacco: Never Used  Vaping Use  . Vaping Use: Never used  Substance Use Topics  . Alcohol use: No    Comment: quit drinking alcohol long time ago  . Drug use: No    Review of Systems Constitutional: No fever/chills Eyes: No visual changes. ENT: No sore throat. Cardiovascular: Endorses chest pain. Respiratory: Denies shortness of breath. Gastrointestinal: No abdominal pain.  No nausea, no vomiting.  No diarrhea. Genitourinary: Negative for dysuria. Musculoskeletal: Positive for left upper extremity pain Skin: Negative for rash. Neurological: Negative for headaches, weakness/numbness/paresthesias in any extremity Psychiatric: Negative for suicidal ideation/homicidal ideation   ____________________________________________   PHYSICAL EXAM:  VITAL SIGNS: ED Triage Vitals  Enc Vitals Group     BP 02/19/20 2101 (!) 144/94     Pulse Rate 02/19/20 2101 75     Resp 02/19/20 2101 17     Temp 02/19/20 2101 98.4 F (36.9 C)     Temp src --      SpO2 02/19/20 2101 95 %     Weight 02/19/20 2057 264 lb (119.7 kg)     Height 02/19/20 2057 5\' 11"  (1.803 m)     Head Circumference --      Peak Flow --      Pain Score 02/19/20 2057 5     Pain Loc --      Pain Edu? --      Excl. in [place]? --    Constitutional: Alert and oriented. Well appearing and in no acute distress. Eyes: Conjunctivae are normal. PERRL. Head: Atraumatic. Nose: No congestion/rhinnorhea. Mouth/Throat: Mucous membranes are moist. Neck: No stridor Cardiovascular: Grossly normal heart sounds.  Good peripheral circulation. Respiratory: Normal respiratory effort.  No retractions. Gastrointestinal: Soft and nontender. No distention. Musculoskeletal: No obvious deformities Neurologic:  Normal speech and language. No gross focal neurologic deficits are appreciated. Skin:  Skin is warm and dry. No rash noted. Psychiatric: Mood and  affect are normal. Speech and behavior are normal.  ____________________________________________   LABS (all labs ordered are listed, but only abnormal results are displayed)  Labs Reviewed  BASIC METABOLIC PANEL - Abnormal; Notable for the following components:      Result Value   Potassium 3.4 (*)    Glucose, Bld 109 (*)    Creatinine, Ser 1.28 (*)    All other components within normal limits  TROPONIN I (HIGH SENSITIVITY) - Abnormal; Notable for the following components:   Troponin I (High Sensitivity) 18 (*)    All other components within normal limits  TROPONIN I (HIGH SENSITIVITY) - Abnormal; Notable for the following components:   Troponin I (High Sensitivity) 18 (*)    All other components within normal limits  CBC    ED ECG REPORT I, Naaman Plummer, the attending physician, personally viewed and interpreted this ECG.  Date: 02/19/2020 EKG Time: 2057 Rate: 76 Rhythm: normal sinus  rhythm QRS Axis: normal Intervals: normal ST/T Wave abnormalities: normal Narrative Interpretation: no evidence of acute ischemia   RADIOLOGY  ED MD interpretation: 2 view x-ray of the chest shows bandlike opacity in the left mid lung as well as chronic hyperinflation and coarsened interstitial opacities.  No acute evidence of pneumonia, pneumothorax, or widened mediastinum  Official radiology report(s): DG Chest 2 View  Result Date: 02/19/2020 CLINICAL DATA:  Chest pain EXAM: CHEST - 2 VIEW COMPARISON:  Radiograph 10/30/2019 FINDINGS: Bandlike opacity in the left mid lung, likely subsegmental atelectasis or scarring. Mild hyperinflation and coarsened interstitial changes are similar to prior. No consolidation, features of edema, pneumothorax, or effusion. The cardiomediastinal contours are unremarkable. No acute osseous or soft tissue abnormality. IMPRESSION: 1. Bandlike opacity in the left mid lung, likely subsegmental atelectasis or scarring. 2. Chronic hyperinflation and coarsened  interstitial changes. 3. No other acute cardiopulmonary abnormality. Electronically Signed   By: Lovena Le M.D.   On: 02/19/2020 21:41    ____________________________________________   PROCEDURES  Procedure(s) performed (including Critical Care):  .1-3 Lead EKG Interpretation Performed by: Naaman Plummer, MD Authorized by: Naaman Plummer, MD     Interpretation: normal     ECG rate:  71   ECG rate assessment: normal     Rhythm: sinus rhythm     Ectopy: none     Conduction: normal       ____________________________________________   INITIAL IMPRESSION / ASSESSMENT AND PLAN / ED COURSE  As part of my medical decision making, I reviewed the following data within the [place] notes reviewed and incorporated, Labs reviewed, EKG interpreted, Old chart reviewed, Radiograph reviewed and Notes from prior ED visits reviewed and incorporated        Workup: ECG, CXR, CBC, BMP, Troponin Findings: ECG: No overt evidence of STEMI. No evidence of Brugadas sign, delta wave, epsilon wave, significantly prolonged QTc, or malignant arrhythmia HS Troponin: Negative x1 Other Labs unremarkable for emergent problems. CXR: Without PTX, PNA, or widened mediastinum Last Stress Test: Never Last Heart Catheterization: Never HEART Score: 2  Given History, Exam, and Workup I have low suspicion for ACS, Pneumothorax, Pneumonia, Pulmonary Embolus, Tamponade, Aortic Dissection or other emergent problem as a cause for this presentation.   Reassesment: Prior to discharge patients pain was controlled and they were well appearing.  Discussed with patient the likelihood of this being postherpetic neuralgia from his shingles diagnosis 2 months ago and attempted symptomatic management with gabapentin.  Patient agrees with plan  Disposition:  Discharge. Strict return precautions discussed with patient with full understanding. Advised patient to follow up promptly with primary  care provider   Clinical Course as of 02/20/20 0010  Mon Feb 20, 2020  0007 GFR, Estimated: >60 [EB]    Clinical Course User Index [EB] Naaman Plummer, MD     ____________________________________________   FINAL CLINICAL IMPRESSION(S) / ED DIAGNOSES  Final diagnoses:  Chest wall pain  Pain of left upper extremity     ED Discharge Orders         Ordered    gabapentin (NEURONTIN) 100 MG capsule  3 times daily        02/20/20 0009           Note:  This document was prepared using Dragon voice recognition software and may include unintentional dictation errors.   Naaman Plummer, MD 02/20/20 [phone]

## 2020-02-20 NOTE — ED Notes (Signed)
Pt c/o left CP sharp to aching 1-5/10 for months, Pt denies cardiac hx seen intermittently by [facility], pt reports no "rhyme or reason" bend the occurrence of CP, pt reports lots of stress brought on lately by money troubles, pt without breathing distress att

## 2020-02-20 NOTE — ED Notes (Signed)
No peripheral IV placed this visit.    Discharge instructions reviewed with patient. Questions fielded by this RN. Patient verbalizes understanding of instructions. Patient discharged home in stable condition per provider. No acute distress noted at time of discharge.    

## 2020-02-20 NOTE — ED Notes (Signed)
Unable to discharge pt att d/t OTF movement

## 2020-03-18 DIAGNOSIS — I4891 Unspecified atrial fibrillation: Secondary | ICD-10-CM | POA: Insufficient documentation

## 2020-04-27 ENCOUNTER — Other Ambulatory Visit: Payer: Self-pay

## 2020-04-27 ENCOUNTER — Emergency Department: Payer: Self-pay

## 2020-04-27 ENCOUNTER — Emergency Department
Admission: EM | Admit: 2020-04-27 | Discharge: 2020-04-27 | Disposition: A | Discharge status: Home / Self Care | Payer: Self-pay | Attending: Emergency Medicine | Admitting: Emergency Medicine

## 2020-04-27 DIAGNOSIS — Z79899 Other long term (current) drug therapy: Secondary | ICD-10-CM | POA: Insufficient documentation

## 2020-04-27 DIAGNOSIS — R0789 Other chest pain: Secondary | ICD-10-CM | POA: Insufficient documentation

## 2020-04-27 DIAGNOSIS — F17210 Nicotine dependence, cigarettes, uncomplicated: Secondary | ICD-10-CM | POA: Insufficient documentation

## 2020-04-27 DIAGNOSIS — I10 Essential (primary) hypertension: Secondary | ICD-10-CM | POA: Insufficient documentation

## 2020-04-27 DIAGNOSIS — R059 Cough, unspecified: Secondary | ICD-10-CM | POA: Insufficient documentation

## 2020-04-27 DIAGNOSIS — R1011 Right upper quadrant pain: Secondary | ICD-10-CM | POA: Insufficient documentation

## 2020-04-27 DIAGNOSIS — Z7982 Long term (current) use of aspirin: Secondary | ICD-10-CM | POA: Insufficient documentation

## 2020-04-27 LAB — URINALYSIS, COMPLETE (UACMP) WITH MICROSCOPIC
Bacteria, UA: NONE SEEN
Bilirubin Urine: NEGATIVE
Glucose, UA: NEGATIVE mg/dL
Hgb urine dipstick: NEGATIVE
Ketones, ur: NEGATIVE mg/dL
Leukocytes,Ua: NEGATIVE
Nitrite: NEGATIVE
Protein, ur: NEGATIVE mg/dL
Specific Gravity, Urine: 1.005 (ref 1.005–1.030)
Squamous Epithelial / HPF: NONE SEEN (ref 0–5)
pH: 5 (ref 5.0–8.0)

## 2020-04-27 LAB — COMPREHENSIVE METABOLIC PANEL
ALT: 21 U/L (ref 0–44)
AST: 20 U/L (ref 15–41)
Albumin: 4 g/dL (ref 3.5–5.0)
Alkaline Phosphatase: 58 U/L (ref 38–126)
Anion gap: 9 (ref 5–15)
BUN: 13 mg/dL (ref 8–23)
CO2: 24 mmol/L (ref 22–32)
Calcium: 8.9 mg/dL (ref 8.9–10.3)
Chloride: 103 mmol/L (ref 98–111)
Creatinine, Ser: 1.24 mg/dL (ref 0.61–1.24)
GFR, Estimated: >60 mL/min (ref >60)
Glucose, Bld: 116 mg/dL — ABNORMAL HIGH (ref 70–99)
Potassium: 3.6 mmol/L (ref 3.5–5.1)
Sodium: 136 mmol/L (ref 135–145)
Total Bilirubin: 0.6 mg/dL (ref 0.3–1.2)
Total Protein: 7.4 g/dL (ref 6.5–8.1)

## 2020-04-27 LAB — CBC
HCT: 42 % (ref 39.0–52.0)
Hemoglobin: 13.6 g/dL (ref 13.0–17.0)
MCH: 28.4 pg (ref 26.0–34.0)
MCHC: 32.4 g/dL (ref 30.0–36.0)
MCV: 87.7 fL (ref 80.0–100.0)
Platelets: 262 10*3/uL (ref 150–400)
RBC: 4.79 MIL/uL (ref 4.22–5.81)
RDW: 15.9 % — ABNORMAL HIGH (ref 11.5–15.5)
WBC: 9.6 10*3/uL (ref 4.0–10.5)
nRBC: 0 % (ref 0.0–0.2)

## 2020-04-27 LAB — LIPASE, BLOOD: Lipase: 25 U/L (ref 11–51)

## 2020-04-27 NOTE — ED Provider Notes (Signed)
[facility] Emergency Department Provider Note   ____________________________________________   Event Date/Time   First MD Initiated Contact with Patient 04/27/20 2122     (approximate)  I have reviewed the triage vital signs and the nursing notes.   HISTORY  Chief Complaint Abdominal Pain    HPI Mitchell Robbins is a 65 y.o. male stated past medical history of hypertension who presents for 1 week of right upper quadrant/right lower anterior chest wall pain that is worsened with movement and coughing and partially relieved with lying still.  Patient states that this pain started when he had a coughing fit and felt a small pop in this region.  Patient currently denies any vision changes, tinnitus, difficulty speaking, facial droop, sore throat, shortness of breath, nausea/vomiting/diarrhea, dysuria, or weakness/numbness/paresthesias in any extremity         Past Medical History:  Diagnosis Date  . Hypertension     Patient Active Problem List   Diagnosis Date Noted  . History of colonic polyps   . Polyp of colon   . Special screening for malignant neoplasms, colon   . Polyp of sigmoid colon   . Benign neoplasm of transverse colon   . Benign neoplasm of cecum   . Personal history of peptic ulcer disease   . PUD (peptic ulcer disease)   . Polyp of duodenum   . Duodenal ulceration   . Acute gastritis without hemorrhage   . Melena 12/23/2017  . Cellulitis 10/01/2015  . Hypertension 01/29/2012    Past Surgical History:  Procedure Laterality Date  . COLONOSCOPY WITH PROPOFOL N/A 01/21/2018   Procedure: COLONOSCOPY WITH BIOPSIES;  Surgeon: Lucilla Lame, MD;  Location: [place];  Service: Endoscopy;  Laterality: N/A;  . COLONOSCOPY WITH PROPOFOL N/A 01/11/2019   Procedure: COLONOSCOPY WITH PROPOFOL;  Surgeon: Lucilla Lame, MD;  Location: [facility] ENDOSCOPY;  Service: Endoscopy;  Laterality: N/A;  . ESOPHAGOGASTRODUODENOSCOPY (EGD) WITH PROPOFOL  N/A 12/24/2017   Procedure: ESOPHAGOGASTRODUODENOSCOPY (EGD) WITH PROPOFOL;  Surgeon: Lucilla Lame, MD;  Location: [facility] ENDOSCOPY;  Service: Endoscopy;  Laterality: N/A;  . ESOPHAGOGASTRODUODENOSCOPY (EGD) WITH PROPOFOL N/A 01/21/2018   Procedure: ESOPHAGOGASTRODUODENOSCOPY (EGD) WITH BIOPSIES;  Surgeon: Lucilla Lame, MD;  Location: [place];  Service: Endoscopy;  Laterality: N/A;  . POLYPECTOMY N/A 01/21/2018   Procedure: POLYPECTOMY;  Surgeon: Lucilla Lame, MD;  Location: [place];  Service: Endoscopy;  Laterality: N/A;  . THYROIDECTOMY      Prior to Admission medications   Medication Sig Start Date End Date Taking? Authorizing Provider  amLODipine (NORVASC) 10 MG tablet Take 1 tablet (10 mg total) by mouth daily. 05/16/19 05/15/20  Gregor Hams, MD  amLODipine (NORVASC) 10 MG tablet Take 1 tablet (10 mg total) by mouth daily. 05/16/19   Gregor Hams, MD  aspirin 81 MG tablet 1 BY MOUTH DAILY FOR HEART PROTECTION 10/22/17   [provider]  gabapentin (NEURONTIN) 100 MG capsule Take 1 capsule (100 mg total) by mouth 3 (three) times daily for 14 days. 02/20/20 03/05/20  Naaman Plummer, MD  hydrochlorothiazide (HYDRODIURIL) 50 MG tablet Take by mouth. 05/16/14   [provider]  lisinopril (PRINIVIL,ZESTRIL) 20 MG tablet Take 40 mg by mouth daily.     [provider]  predniSONE (DELTASONE) 10 MG tablet Take 6 tablets on day 1, take 5 tablets on day 2, take 4 tablets on day 3, take 3 tablets on day 4, take 2 tablets on day 5, take 1 tablet  on day 6 05/26/19   Laban Emperor, PA-C    Allergies Patient has no known allergies.  Family History  Problem Relation Age of Onset  . Hypertension Mother     Social History Social History   Tobacco Use  . Smoking status: Current Every Day Smoker    Packs/day: 1.00    Years: 40.00    Pack years: 40.00  . Smokeless tobacco: Never Used  Vaping Use  . Vaping Use: Never used  Substance Use Topics   . Alcohol use: No    Comment: quit drinking alcohol long time ago  . Drug use: No    Review of Systems Constitutional: No fever/chills Eyes: No visual changes. ENT: No sore throat. Cardiovascular: Endorses chest pain. Respiratory: Denies shortness of breath. Gastrointestinal: Endorses abdominal pain.  No nausea, no vomiting.  No diarrhea. Genitourinary: Negative for dysuria. Musculoskeletal: Negative for acute arthralgias Skin: Negative for rash. Neurological: Negative for headaches, weakness/numbness/paresthesias in any extremity Psychiatric: Negative for suicidal ideation/homicidal ideation   ____________________________________________   PHYSICAL EXAM:  VITAL SIGNS: ED Triage Vitals  Enc Vitals Group     BP 04/27/20 2107 (!) 146/98     Pulse Rate 04/27/20 2107 79     Resp 04/27/20 2107 20     Temp 04/27/20 2107 97.9 F (36.6 C)     Temp Source 04/27/20 2107 Oral     SpO2 04/27/20 2107 95 %     Weight 04/27/20 2112 284 lb (128.8 kg)     Height 04/27/20 2112 5\' 11"  (1.803 m)     Head Circumference --      Peak Flow --      Pain Score 04/27/20 2111 7     Pain Loc --      Pain Edu? --      Excl. in [place]? --    Constitutional: Alert and oriented. Well appearing and in no acute distress. Eyes: Conjunctivae are normal. PERRL. Head: Atraumatic. Nose: No congestion/rhinnorhea. Mouth/Throat: Mucous membranes are moist. Neck: No stridor Cardiovascular: Grossly normal heart sounds.  Good peripheral circulation. Respiratory: Normal respiratory effort.  No retractions. Gastrointestinal: Soft and tender to palpation over the right anterior lateral lower rib cage. No distention. Musculoskeletal: No obvious deformities Neurologic:  Normal speech and language. No gross focal neurologic deficits are appreciated. Skin:  Skin is warm and dry. No rash noted. Psychiatric: Mood and affect are normal. Speech and behavior are normal.  ____________________________________________    LABS (all labs ordered are listed, but only abnormal results are displayed)  Labs Reviewed  COMPREHENSIVE METABOLIC PANEL - Abnormal; Notable for the following components:      Result Value   Glucose, Bld 116 (*)    All other components within normal limits  CBC - Abnormal; Notable for the following components:   RDW 15.9 (*)    All other components within normal limits  URINALYSIS, COMPLETE (UACMP) WITH MICROSCOPIC - Abnormal; Notable for the following components:   Color, Urine STRAW (*)    APPearance CLEAR (*)    All other components within normal limits  LIPASE, BLOOD   RADIOLOGY  ED MD interpretation: Early right upper quadrant ultrasound cannot visualize the gallbladder but does note fatty liver  Official radiology report(s): US Abdomen Limited RUQ (LIVER/GB)  Result Date: 04/27/2020 CLINICAL DATA:  Abdominal pain x2 days. EXAM: ULTRASOUND ABDOMEN LIMITED RIGHT UPPER QUADRANT COMPARISON:  None. FINDINGS: Gallbladder: The gallbladder is not visualized. Common bile duct: Diameter: 4.9 mm Liver: No focal lesion identified. Diffusely  increased echogenicity of the liver parenchyma is seen. Portal vein is patent on color Doppler imaging with normal direction of blood flow towards the liver. Other: None. IMPRESSION: 1. Nonvisualization of the gallbladder. This may be markedly contracted, as the patient ate 3 hours prior to the exam. 2. Fatty liver. Electronically Signed   By: Virgina Norfolk M.D.   On: 04/27/2020 22:16    ____________________________________________   PROCEDURES  Procedure(s) performed (including Critical Care):  .1-3 Lead EKG Interpretation Performed by: Naaman Plummer, MD Authorized by: Naaman Plummer, MD     Interpretation: normal     ECG rate:  64   ECG rate assessment: normal     Rhythm: sinus rhythm     Ectopy: none     Conduction: normal       ____________________________________________   INITIAL IMPRESSION / ASSESSMENT AND PLAN / ED  COURSE  As part of my medical decision making, I reviewed the following data within the [place] notes reviewed and incorporated, Labs reviewed, EKG interpreted, Old chart reviewed, Radiograph reviewed and Notes from prior ED visits reviewed and incorporated        Patient presents for abdominal pain.  Differential diagnosis includes appendicitis, abdominal aortic aneurysm, surgical biliary disease, pancreatitis, SBO, mesenteric ischemia, serious intra-abdominal bacterial illness, genital torsion. Doubt atypical ACS. Based on history, physical exam, radiologic/laboratory evaluation, there is no red flag results or symptomatology requiring emergent intervention or need for admission at this time Pt tolerating PO. Disposition: Patient will be discharged with strict return precautions and follow up with primary MD within 12-24 hours for further evaluation. Patient understands that this still may have an early presentation of an emergent medical condition such as appendicitis that will require a recheck.      ____________________________________________   FINAL CLINICAL IMPRESSION(S) / ED DIAGNOSES  Final diagnoses:  RUQ pain  Right upper quadrant abdominal pain     ED Discharge Orders    None       Note:  This document was prepared using Dragon voice recognition software and may include unintentional dictation errors.   Naaman Plummer, MD 04/27/20 [phone]

## 2020-04-27 NOTE — ED Triage Notes (Signed)
Pt presents to ER c/o RUQ abdominal pain x 1 week.  Pain has become progressively worse over several days  Pt states pain is aching in nature.  Pt denies n/v/d.  NAD noted at this time.  Pt ambulatory in triage.  Even chest rise and fall noted.

## 2020-05-18 ENCOUNTER — Other Ambulatory Visit: Payer: Self-pay | Admitting: Physician Assistant

## 2020-05-18 DIAGNOSIS — R1011 Right upper quadrant pain: Secondary | ICD-10-CM

## 2020-06-05 ENCOUNTER — Ambulatory Visit
Admission: RE | Admit: 2020-06-05 | Discharge: 2020-06-05 | Disposition: A | Discharge status: Home / Self Care | Payer: Self-pay | Source: Ambulatory Visit | Attending: Physician Assistant | Admitting: Physician Assistant

## 2020-06-05 ENCOUNTER — Other Ambulatory Visit: Payer: Self-pay

## 2020-06-05 DIAGNOSIS — R1011 Right upper quadrant pain: Secondary | ICD-10-CM | POA: Insufficient documentation

## 2020-06-05 LAB — POCT I-STAT CREATININE: Creatinine, Ser: 1.4 mg/dL — ABNORMAL HIGH (ref 0.61–1.24)

## 2020-06-05 MED ORDER — IOHEXOL 300 MG/ML  SOLN
100.0000 mL | Freq: Once | INTRAMUSCULAR | Status: AC | PRN
  Administered 2020-06-05: 100 mL via INTRAVENOUS

## 2020-06-14 ENCOUNTER — Other Ambulatory Visit: Payer: Self-pay

## 2020-06-14 ENCOUNTER — Encounter: Payer: Self-pay | Admitting: Urology

## 2020-06-14 ENCOUNTER — Ambulatory Visit (INDEPENDENT_AMBULATORY_CARE_PROVIDER_SITE_OTHER): Payer: Self-pay | Admitting: Urology

## 2020-06-14 VITALS — BP 184/82 | HR 96 | Ht 71.0 in | Wt 284.0 lb

## 2020-06-14 DIAGNOSIS — N400 Enlarged prostate without lower urinary tract symptoms: Secondary | ICD-10-CM

## 2020-06-14 DIAGNOSIS — N200 Calculus of kidney: Secondary | ICD-10-CM

## 2020-06-14 LAB — BLADDER SCAN AMB NON-IMAGING: Scan Result: 0

## 2020-06-14 NOTE — Progress Notes (Signed)
06/14/20 1:47 PM   Mitchell Robbins Jun 28, 1955 [id number]  CC: Kidney stone, BPH  HPI: I saw Mitchell Robbins in urology clinic for the above issues.  He is a 65 year old male with obesity and BMI of 39 and distant history of nephrolithiasis over 10 years ago treated with shockwave lithotripsy who recently had a CT performed for abdominal pain.  This showed a non-obstructive right lower pole renal stone measuring approximately 2 cm total with no hydronephrosis, and an enlarged prostate measuring 110 g.  He takes doxazosin 4 mg nightly.  His primary urinary complaints are frequency during the day and nocturia.  IPSS score today is 12, with quality of life mixed, and PVR is normal at 0 mL.  He denies any history of UTIs, gross hematuria, or urinary retention.  He has not particularly bothered by his urinary symptoms.    He thinks he has a brother with prostate cancer, but was unsure how he was treated.  PSA has been normal, most recently 2.75 in October 2021.  CT also showed a 2 cm right lower pole stone with no hydronephrosis or obstruction.  He denies any recent stone events.  He denies any flank pain or gross hematuria.  Urinalysis today benign with 0 WBCs, 0 RBCs, no bacteria, nitrite negative, no leukocytes.   PMH: Past Medical History:  Diagnosis Date  . Hypertension     Surgical History: Past Surgical History:  Procedure Laterality Date  . COLONOSCOPY WITH PROPOFOL N/A 01/21/2018   Procedure: COLONOSCOPY WITH BIOPSIES;  Surgeon: Lucilla Lame, MD;  Location: [place];  Service: Endoscopy;  Laterality: N/A;  . COLONOSCOPY WITH PROPOFOL N/A 01/11/2019   Procedure: COLONOSCOPY WITH PROPOFOL;  Surgeon: Lucilla Lame, MD;  Location: [facility] ENDOSCOPY;  Service: Endoscopy;  Laterality: N/A;  . ESOPHAGOGASTRODUODENOSCOPY (EGD) WITH PROPOFOL N/A 12/24/2017   Procedure: ESOPHAGOGASTRODUODENOSCOPY (EGD) WITH PROPOFOL;  Surgeon: Lucilla Lame, MD;  Location: [facility] ENDOSCOPY;  Service: Endoscopy;   Laterality: N/A;  . ESOPHAGOGASTRODUODENOSCOPY (EGD) WITH PROPOFOL N/A 01/21/2018   Procedure: ESOPHAGOGASTRODUODENOSCOPY (EGD) WITH BIOPSIES;  Surgeon: Lucilla Lame, MD;  Location: [place];  Service: Endoscopy;  Laterality: N/A;  . POLYPECTOMY N/A 01/21/2018   Procedure: POLYPECTOMY;  Surgeon: Lucilla Lame, MD;  Location: [place];  Service: Endoscopy;  Laterality: N/A;  . THYROIDECTOMY      Family History: Family History  Problem Relation Age of Onset  . Hypertension Mother     Social History:  reports that he has been smoking. He has a 40.00 pack-year smoking history. He has never used smokeless tobacco. He reports that he does not drink alcohol and does not use drugs.  Physical Exam: BP (!) 184/82   Pulse 96   Ht 5\' 11"  (1.803 m)   Wt 284 lb (128.8 kg)   BMI 39.61 kg/m    Constitutional:  Alert and oriented, No acute distress. Cardiovascular: No clubbing, cyanosis, or edema. Respiratory: Normal respiratory effort, no increased work of breathing. GI: Abdomen is soft, nontender, nondistended, no abdominal masses GU: No CVA tenderness DRE: Patient refused DRE  Laboratory Data: Reviewed, see HPI  Pertinent Imaging: I have personally viewed and interpreted the CT dated 06/05/2020 showing a 110 g prostate with a median lobe, no significant distention of the bladder, no hydronephrosis, and a 2 cm right lower pole nonobstructive stone  Assessment & Plan:   65 year old male with a nonobstructive 2 cm right lower pole stone, benign urinalysis, enlarged prostate to 110 g on CT, normal PSA of 2.75,  and primary urinary complaint of frequency despite doxazosin.  He is minimally bothered by his urinary symptoms.  We discussed options for his nonobstructive large 2 cm right lower pole stone including observation with surveillance, ureteroscopy/laser lithotripsy/stent placement, or PCNL.  Risks and benefits discussed at length.  The stone would be too large for  shockwave lithotripsy, with low passage rate being located in the lower pole.  Return precautions discussed extensively.  He would like to pursue surveillance with repeat KUB in 6 months.  RTC 6 months for KUB for surveillance of large right lower pole stone   Mitchell [place], MD 06/14/2020  [place] [address] [place],  [address] [phone]

## 2020-06-15 LAB — MICROSCOPIC EXAMINATION
Bacteria, UA: NONE SEEN
WBC, UA: NONE SEEN /hpf (ref 0–5)

## 2020-06-15 LAB — URINALYSIS, COMPLETE
Bilirubin, UA: NEGATIVE
Glucose, UA: NEGATIVE
Leukocytes,UA: NEGATIVE
Nitrite, UA: NEGATIVE
RBC, UA: NEGATIVE
Specific Gravity, UA: 1.025 (ref 1.005–1.030)
Urobilinogen, Ur: 0.2 mg/dL (ref 0.2–1.0)
pH, UA: 5 (ref 5.0–7.5)

## 2020-07-06 ENCOUNTER — Ambulatory Visit: Payer: Self-pay

## 2020-07-10 ENCOUNTER — Ambulatory Visit: Payer: Self-pay | Attending: Internal Medicine

## 2020-07-20 ENCOUNTER — Ambulatory Visit: Payer: Self-pay | Attending: Internal Medicine

## 2020-07-20 DIAGNOSIS — Z23 Encounter for immunization: Secondary | ICD-10-CM

## 2020-07-20 NOTE — Progress Notes (Signed)
   Covid-19 Vaccination Clinic  Name:  Mitchell Robbins    MRN: [id number] DOB: 12-15-1955  07/20/2020  [name] was observed post Covid-19 immunization for 15 minutes without incident. He was provided with Vaccine Information Sheet and instruction to access the V-Safe system.   [name] was instructed to call 911 with any severe reactions post vaccine: Marland Kitchen Difficulty breathing  . Swelling of face and throat  . A fast heartbeat  . A bad rash all over body  . Dizziness and weakness   Immunizations Administered    Name Date Dose VIS Date Route   Moderna Covid-19 Booster Vaccine 07/20/2020  2:40 PM 0.25 mL 12/28/2019 Intramuscular   Manufacturer: Levan Hurst   Lot: [id number]   [place]: [place], PharmD, MBA Clinical Acute Care Pharmacist

## 2020-07-24 ENCOUNTER — Other Ambulatory Visit: Payer: Self-pay

## 2020-07-24 MED ORDER — MODERNA COVID-19 VACCINE 100 MCG/0.5ML IM SUSP
INTRAMUSCULAR | 0 refills | Status: DC
  Filled 2020-07-24: qty 0.25, 1d supply, fill #0

## 2020-11-01 ENCOUNTER — Encounter: Payer: Self-pay | Admitting: *Deleted

## 2020-11-01 ENCOUNTER — Encounter: Payer: Self-pay | Attending: Family Medicine | Admitting: *Deleted

## 2020-11-01 ENCOUNTER — Other Ambulatory Visit: Payer: Self-pay

## 2020-11-01 VITALS — BP 150/96 | Ht 71.0 in | Wt 280.8 lb

## 2020-11-01 DIAGNOSIS — E119 Type 2 diabetes mellitus without complications: Secondary | ICD-10-CM | POA: Insufficient documentation

## 2020-11-01 DIAGNOSIS — Z713 Dietary counseling and surveillance: Secondary | ICD-10-CM | POA: Insufficient documentation

## 2020-11-01 NOTE — Patient Instructions (Signed)
Exercise:  Begin walking   for   10  minutes   3  days a week and gradually increase to 30 minutes 5 x week  Eat 3 meals day,   1-2  snacks a day Space meals 4-6 hours apart Don't skip meals Avoid sugar sweetened drinks (tea, juices)  Quit smoking  Call back if you want to schedule an appointment with the nurse or dietitian

## 2020-11-01 NOTE — Progress Notes (Signed)
Diabetes Self-Management Education  Visit Type: First/Initial  Appt. Start Time: 1550 Appt. End Time: [id number]  11/01/2020  Mr. Mitchell Robbins, identified by name and date of birth, is a 65 y.o. male with a diagnosis of Diabetes: Type 2.   ASSESSMENT  Blood pressure (!) 150/96, height '5\' 11"'$  (1.803 m), weight 280 lb 12.8 oz (127.4 kg). Body mass index is 39.16 kg/m.   Diabetes Self-Management Education - 11/01/20 1655       Visit Information   Visit Type First/Initial      Initial Visit   Diabetes Type Type 2    Are you currently following a meal plan? Yes    What type of meal plan do you follow? "salads, no fried foods, no red meat, vegetables"    Are you taking your medications as prescribed? Yes    Date Diagnosed August 2022      Health Coping   How would you rate your overall health? Fair      Psychosocial Assessment   Patient Belief/Attitude about Diabetes Motivated to manage diabetes   "I don't like it"   Self-care barriers Lack of material resources   No insurance   Self-management support Doctor's office;Family    Patient Concerns Nutrition/Meal planning;Healthy Lifestyle;Weight Control    Special Needs None    Preferred Learning Style Auditory    Learning Readiness Ready    How often do you need to have someone help you when you read instructions, pamphlets, or other written materials from your doctor or pharmacy? 1 - Never    What is the last grade level you completed in school? 10th      Pre-Education Assessment   Patient understands the diabetes disease and treatment process. Needs Instruction    Patient understands incorporating nutritional management into lifestyle. Needs Instruction    Patient undertands incorporating physical activity into lifestyle. Needs Instruction    Patient understands using medications safely. Needs Instruction    Patient understands monitoring blood glucose, interpreting and using results Needs Instruction    Patient understands  prevention, detection, and treatment of acute complications. Needs Instruction    Patient understands prevention, detection, and treatment of chronic complications. Needs Instruction    Patient understands how to develop strategies to address psychosocial issues. Needs Instruction    Patient understands how to develop strategies to promote health/change behavior. Needs Instruction      Complications   Last HgB A1C per patient/outside source 7 %   10/10/2020   How often do you check your blood sugar? 0 times/day (not testing)   BG in the office was 130 mg/dL at 4:35 pm - 4 hrs pp. Discussed patient buying a store brand meter if he wanted to check blood sugars at home due to no insurance.   Have you had a dilated eye exam in the past 12 months? Yes    Have you had a dental exam in the past 12 months? No    Are you checking your feet? No      Dietary Intake   Breakfast cereal and milk; oatmeal    Robbins skips or eats peanut butter crackers or nuts    Dinner chicken, fish; potatoes, rice, pastas, peas, beans, corn, green beans, greens, broccoli, squash, okra; salads with meat, lettuce, tomato, cuccumbers    Beverage(s) water, green tea, juice      Exercise   Exercise Type ADL's      Patient Education   Previous Diabetes Education No    Disease state  Definition of diabetes, type 1 and 2, and the diagnosis of diabetes    Nutrition management  Role of diet in the treatment of diabetes and the relationship between the three main macronutrients and blood glucose level;Food label reading, portion sizes and measuring food.;Reviewed blood glucose goals for pre and post meals and how to evaluate the patients' food intake on their blood glucose level.    Physical activity and exercise  Role of exercise on diabetes management, blood pressure control and cardiac health.    Monitoring Purpose and frequency of SMBG.;Taught/discussed recording of test results and interpretation of SMBG.;Identified appropriate  SMBG and/or A1C goals.    Chronic complications Relationship between chronic complications and blood glucose control    Psychosocial adjustment Identified and addressed patients feelings and concerns about diabetes    Personal strategies to promote health Review risk of smoking and offered smoking cessation      Individualized Goals (developed by patient)   Reducing Risk Other (comment)   lose weight, lead a healthier lifestyle, quit smoking, become more fit     Outcomes   Expected Outcomes Demonstrated interest in learning. Expect positive outcomes    Future DMSE --   Patient doesn't have insurance. He will call back if he wants to have another appointment.   Program Status Not Completed         Individualized Plan for Diabetes Self-Management Training:   Learning Objective:  Patient will have a greater understanding of diabetes self-management. Patient education plan is to attend individual and/or group sessions per assessed needs and concerns.   Plan:   Patient Instructions  Exercise:  Begin walking   for   10  minutes   3  days a week and gradually increase to 30 minutes 5 x week  Eat 3 meals day,   1-2  snacks a day Space meals 4-6 hours apart Don't skip meals Avoid sugar sweetened drinks (tea, juices)  Quit smoking  Call back if you want to schedule an appointment with the nurse or dietitian  Expected Outcomes:  Demonstrated interest in learning. Expect positive outcomes  Education material provided:  General Meal Planning Guidelines Simple Meal Plan Smoking Cessation  If problems or questions, patient to contact team via:   Johny Drilling, RN, [place], [place] [phone]  Future DSME appointment:  (Patient doesn't have insurance. He will call back if he wants to have another appointment.)

## 2020-12-27 ENCOUNTER — Ambulatory Visit
Admission: RE | Admit: 2020-12-27 | Discharge: 2020-12-27 | Disposition: A | Discharge status: Home / Self Care | Payer: Self-pay | Source: Ambulatory Visit | Attending: Urology | Admitting: Urology

## 2020-12-27 ENCOUNTER — Other Ambulatory Visit: Payer: Self-pay

## 2020-12-27 ENCOUNTER — Ambulatory Visit (INDEPENDENT_AMBULATORY_CARE_PROVIDER_SITE_OTHER): Payer: Self-pay | Admitting: Urology

## 2020-12-27 ENCOUNTER — Encounter: Payer: Self-pay | Admitting: Urology

## 2020-12-27 VITALS — BP 198/108 | HR 76 | Ht 70.0 in | Wt 280.0 lb

## 2020-12-27 DIAGNOSIS — N401 Enlarged prostate with lower urinary tract symptoms: Secondary | ICD-10-CM

## 2020-12-27 DIAGNOSIS — N138 Other obstructive and reflux uropathy: Secondary | ICD-10-CM

## 2020-12-27 DIAGNOSIS — N200 Calculus of kidney: Secondary | ICD-10-CM | POA: Insufficient documentation

## 2020-12-27 DIAGNOSIS — Z125 Encounter for screening for malignant neoplasm of prostate: Secondary | ICD-10-CM

## 2020-12-27 DIAGNOSIS — N400 Enlarged prostate without lower urinary tract symptoms: Secondary | ICD-10-CM

## 2020-12-27 NOTE — Patient Instructions (Signed)
Prostate Cancer Screening Prostate cancer screening is a test that is done to check for the presence of prostate cancer in men. The prostate gland is a walnut-sized gland that is located below the bladder and in front of the rectum in males. The function of the prostate is to add fluid to semen during ejaculation. Prostate cancer is the second most common type of cancer in men. Who should have prostate cancer screening? Screening recommendations vary based on age and other risk factors. Screening is recommended if: You are older than age 66. If you are age 66-66, talk with your health care provider about your need for screening and how often screening should be done. Because most prostate cancers are slow growing and will not cause death, screening is generally reserved in this age group for men who have a 10-15-year life expectancy. You are younger than age 66, and you have these risk factors: Being a [place] male or a male of African descent. Having a father, brother, or uncle who has been diagnosed with prostate cancer. The risk is higher if your family member's cancer occurred at an early age. Screening is not recommended if: You are younger than age 75. You are between the ages of 35 and 66 and you have no risk factors. You are 66 years of age or older. At this age, the risks that screening can cause are greater than the benefits that it may provide. If you are at high risk for prostate cancer, your health care provider may recommend that you have screenings more often or that you start screening at a younger age. How is screening for prostate cancer done? The recommended prostate cancer screening test is a blood test called the prostate-specific antigen (PSA) test. PSA is a protein that is made in the prostate. As you age, your prostate naturally produces more PSA. Abnormally high PSA levels may be caused by: Prostate cancer. An enlarged prostate that is not caused by cancer (benign prostatic  hyperplasia, BPH). This condition is very common in older men. A prostate gland infection (prostatitis). Depending on the PSA results, you may need more tests, such as: A physical exam to check the size of your prostate gland. Blood and imaging tests. A procedure to remove tissue samples from your prostate gland for testing (biopsy). What are the benefits of prostate cancer screening? Screening can help to identify cancer at an early stage, before symptoms start and when the cancer can be treated more easily. There is a small chance that screening may lower your risk of dying from prostate cancer. The chance is small because prostate cancer is a slow-growing cancer, and most men with prostate cancer die from a different cause. What are the risks of prostate cancer screening? The main risk of prostate cancer screening is diagnosing and treating prostate cancer that would never have caused any symptoms or problems. This is called overdiagnosisand overtreatment. PSA screening cannot tell you if your PSA is high due to cancer or a different cause. A prostate biopsy is the only procedure to diagnose prostate cancer. Even the results of a biopsy may not tell you if your cancer needs to be treated. Slow-growing prostate cancer may not need any treatment other than monitoring, so diagnosing and treating it may cause unnecessary stress or other side effects. Questions to ask your health care provider When should I start prostate cancer screening? What is my risk for prostate cancer? How often do I need screening? What type of screening tests do  I need? How do I get my test results? What do my results mean? Do I need treatment? Where to find more information The American Cancer Society: www.cancer.org American Urological Association: www.auanet.org Contact a health care provider if: You have difficulty urinating. You have pain when you urinate or ejaculate. You have blood in your urine or semen. You  have pain in your back or in the area of your prostate. Summary Prostate cancer is a common type of cancer in men. The prostate gland is located below the bladder and in front of the rectum. This gland adds fluid to semen during ejaculation. Prostate cancer screening may identify cancer at an early stage, when the cancer can be treated more easily. The prostate-specific antigen (PSA) test is the recommended screening test for prostate cancer. Discuss the risks and benefits of prostate cancer screening with your health care provider. If you are age 66 or older, the risks that screening can cause are greater than the benefits that it may provide. This information is not intended to replace advice given to you by your health care provider. Make sure you discuss any questions you have with your health care provider. Document Revised: 04/27/2020 Document Reviewed: 10/07/2018 Elsevier Patient Education  [place].   Dietary Guidelines to Help Prevent Kidney Stones Kidney stones are deposits of minerals and salts that form inside your kidneys. Your risk of developing kidney stones may be greater depending on your diet, your lifestyle, the medicines you take, and whether you have certain medical conditions. Most people can lower their chances of developing kidney stones by following the instructions below. Your dietitian may give you more specific instructions depending on your overall health and the type of kidney stones you tend to develop. What are tips for following this plan? Reading food labels  Choose foods with "no salt added" or "low-salt" labels. Limit your salt (sodium) intake to less than 1,500 mg a day. Choose foods with calcium for each meal and snack. Try to eat about 300 mg of calcium at each meal. Foods that contain 200-500 mg of calcium a serving include: 8 oz (237 mL) of milk, calcium-fortifiednon-dairy milk, and calcium-fortifiedfruit juice. Calcium-fortified means that calcium  has been added to these drinks. 8 oz (237 mL) of kefir, yogurt, and soy yogurt. 4 oz (114 g) of tofu. 1 oz (28 g) of cheese. 1 cup (150 g) of dried figs. 1 cup (91 g) of cooked broccoli. One 3 oz (85 g) can of sardines or mackerel. Most people need 1,000-1,500 mg of calcium a day. Talk to your dietitian about how much calcium is recommended for you. Shopping Buy plenty of fresh fruits and vegetables. Most people do not need to avoid fruits and vegetables, even if these foods contain nutrients that may contribute to kidney stones. When shopping for convenience foods, choose: Whole pieces of fruit. Pre-made salads with dressing on the side. Low-fat fruit and yogurt smoothies. Avoid buying frozen meals or prepared deli foods. These can be high in sodium. Look for foods with live cultures, such as yogurt and kefir. Choose high-fiber grains, such as whole-wheat breads, oat bran, and wheat cereals. Cooking Do not add salt to food when cooking. Place a salt shaker on the table and allow each person to add his or her own salt to taste. Use vegetable protein, such as beans, textured vegetable protein (TVP), or tofu, instead of meat in pasta, casseroles, and soups. Meal planning Eat less salt, if told by your dietitian. To  do this: Avoid eating processed or pre-made food. Avoid eating fast food. Eat less animal protein, including cheese, meat, poultry, or fish, if told by your dietitian. To do this: Limit the number of times you have meat, poultry, fish, or cheese each week. Eat a diet free of meat at least 2 days a week. Eat only one serving each day of meat, poultry, fish, or seafood. When you prepare animal protein, cut pieces into small portion sizes. For most meat and fish, one serving is about the size of the palm of your hand. Eat at least five servings of fresh fruits and vegetables each day. To do this: Keep fruits and vegetables on hand for snacks. Eat one piece of fruit or a handful of  berries with breakfast. Have a salad and fruit at lunch. Have two kinds of vegetables at dinner. Limit foods that are high in a substance called oxalate. These include: Spinach (cooked), rhubarb, beets, sweet potatoes, and Swiss chard. Peanuts. Potato chips, french fries, and baked potatoes with skin on. Nuts and nut products. Chocolate. If you regularly take a diuretic medicine, make sure to eat at least 1 or 2 servings of fruits or vegetables that are high in potassium each day. These include: Avocado. Banana. Orange, prune, carrot, or tomato juice. Baked potato. Cabbage. Beans and split peas. Lifestyle  Drink enough fluid to keep your urine pale yellow. This is the most important thing you can do. Spread your fluid intake throughout the day. If you drink alcohol: Limit how much you use to: 0-1 drink a day for women who are not pregnant. 0-2 drinks a day for men. Be aware of how much alcohol is in your drink. In the [place] one drink equals one 12 oz bottle of beer (355 mL), one 5 oz glass of wine (148 mL), or one 1 oz glass of hard liquor (44 mL). Lose weight if told by your health care provider. Work with your dietitian to find an eating plan and weight loss strategies that work best for you. General information Talk to your health care provider and dietitian about taking daily supplements. You may be told the following depending on your health and the cause of your kidney stones: Not to take supplements with vitamin C. To take a calcium supplement. To take a daily probiotic supplement. To take other supplements such as magnesium, fish oil, or vitamin B6. Take over-the-counter and prescription medicines only as told by your health care provider. These include supplements. What foods should I limit? Limit your intake of the following foods, or eat them as told by your dietitian. Vegetables Spinach. Rhubarb. Beets. Canned vegetables. Angie Fava. Olives. Baked potatoes with  skin. Grains Wheat bran. Baked goods. Salted crackers. Cereals high in sugar. Meats and other proteins Nuts. Nut butters. Large portions of meat, poultry, or fish. Salted, precooked, or cured meats, such as sausages, meat loaves, and hot dogs. Dairy Cheese. Beverages Regular soft drinks. Regular vegetable juice. Seasonings and condiments Seasoning blends with salt. Salad dressings. Soy sauce. Ketchup. Barbecue sauce. Other foods Canned soups. Canned pasta sauce. Casseroles. Pizza. Lasagna. Frozen meals. Potato chips. [place] fries. The items listed above may not be a complete list of foods and beverages you should limit. Contact a dietitian for more information. What foods should I avoid? Talk to your dietitian about specific foods you should avoid based on the type of kidney stones you have and your overall health. Fruits Grapefruit. The item listed above may not be a complete list  of foods and beverages you should avoid. Contact a dietitian for more information. Summary Kidney stones are deposits of minerals and salts that form inside your kidneys. You can lower your risk of kidney stones by making changes to your diet. The most important thing you can do is drink enough fluid. Drink enough fluid to keep your urine pale yellow. Talk to your dietitian about how much calcium you should have each day, and eat less salt and animal protein as told by your dietitian. This information is not intended to replace advice given to you by your health care provider. Make sure you discuss any questions you have with your health care provider. Document Revised: 02/17/2019 Document Reviewed: 02/17/2019 Elsevier Patient Education  2022 [employer].

## 2020-12-27 NOTE — Progress Notes (Signed)
   12/27/2020 12:54 PM   Mitchell Robbins 25-Dec-1955 [id number]  Reason for visit: Follow up BPH, PSA screening, nephrolithiasis  HPI: 65 year old male with family history of prostate cancer in his brother(type and treatment unknown), asymptomatic 2 cm right lower pole stone without hydronephrosis, and 110 g prostate on CT scan with mild BPH symptoms.  PSA last year was normal at 2.75, will be due for PSA again next year.  He has refused DRE multiple times.  He previously was on doxazosin, but this medication has been discontinued, and he denies any change in his urinary symptoms.  He really denies any urinary complaints today.  He denies gross hematuria.  No history of UTIs or urinary retention.  I personally viewed and interpreted his KUB today that shows a stable 2 cm right lower pole stone, on chart review it sounds like this has been present since at least ~2006.   Risks and benefits of PSA screening, and the AUA guidelines reviewed.  We also discussed stone prevention strategies including increased fluid intake, citrate, low-salt diet, and avoiding high oxalate foods.  We discussed options including surveillance or intervention with ureteroscopy or PCNL.  He is asymptomatic at this time and with the stability greater than 10 years, I think continued surveillance is reasonable.  RTC 1 year with PSA and KUB prior  Billey Co, MD  [facility] [address], [place], [place] [address] [phone]

## 2021-01-18 ENCOUNTER — Telehealth: Payer: Self-pay | Admitting: Gastroenterology

## 2021-01-21 NOTE — Telephone Encounter (Signed)
Returned patient call to schedule for recall colonoscopy. Unable to leave message.

## 2021-04-23 ENCOUNTER — Ambulatory Visit: Payer: Self-pay | Admitting: Gastroenterology

## 2021-04-23 NOTE — Progress Notes (Unsigned)
° ° °  Primary Care Physician: [facility]  Primary Gastroenterologist:  Dr. Lucilla Lame  No chief complaint on file.   HPI: Mitchell Robbins is a 66 y.o. male here who was seen by his primary care provider back in December 2022 and at that time was reporting constipation.  The patient had a KUB which is not readable on his care everywhere chart at this time.  The patient had a colonoscopy by me back in November 2020 and at that time was recommended to have repeat colonoscopy in 2 years.  This was recommended due to his number of polyps.  Past Medical History:  Diagnosis Date   Diabetes mellitus without complication (HCC)    Hypertension     Current Outpatient Medications  Medication Sig Dispense Refill   chlorthalidone (HYGROTON) 25 MG tablet Take 25 mg by mouth every morning.     cloNIDine (CATAPRES) 0.2 MG tablet Take 0.2 mg by mouth 2 (two) times daily.     furosemide (LASIX) 20 MG tablet Take 20 mg by mouth daily.     hydrALAZINE (APRESOLINE) 100 MG tablet Take 100 mg by mouth 3 (three) times daily.     isosorbide mononitrate (IMDUR) 60 MG 24 hr tablet Take 1 tablet by mouth daily.     labetalol (NORMODYNE) 200 MG tablet Take 200 mg by mouth 2 (two) times daily.     omeprazole (PRILOSEC) 40 MG capsule Take 1 capsule by mouth daily.     No current facility-administered medications for this visit.    Allergies as of 04/23/2021   (No Known Allergies)    ROS:  General: Negative for anorexia, weight loss, fever, chills, fatigue, weakness. ENT: Negative for hoarseness, difficulty swallowing , nasal congestion. CV: Negative for chest pain, angina, palpitations, dyspnea on exertion, peripheral edema.  Respiratory: Negative for dyspnea at rest, dyspnea on exertion, cough, sputum, wheezing.  GI: See history of present illness. GU:  Negative for dysuria, hematuria, urinary incontinence, urinary frequency, nocturnal urination.  Endo: Negative for unusual weight change.     Physical Examination:   There were no vitals taken for this visit.  General: Well-nourished, well-developed in no acute distress.  Eyes: No icterus. Conjunctivae pink. Lungs: Clear to auscultation bilaterally. Non-labored. Heart: Regular rate and rhythm, no murmurs rubs or gallops.  Abdomen: Bowel sounds are normal, nontender, nondistended, no hepatosplenomegaly or masses, no abdominal bruits or hernia , no rebound or guarding.   Extremities: No lower extremity edema. No clubbing or deformities. Neuro: Alert and oriented x 3.  Grossly intact. Skin: Warm and dry, no jaundice.   Psych: Alert and cooperative, normal mood and affect.  Labs:    Imaging Studies: No results found.  Assessment and Plan:   Mitchell Robbins is a 66 y.o. y/o male ***     Lucilla Lame, MD. [facility]    Note: This dictation was prepared with Dragon dictation along with smaller phrase technology. Any transcriptional errors that result from this process are unintentional.

## 2021-06-10 ENCOUNTER — Other Ambulatory Visit: Payer: Self-pay | Admitting: Cardiology

## 2021-06-10 DIAGNOSIS — Z720 Tobacco use: Secondary | ICD-10-CM | POA: Insufficient documentation

## 2021-06-10 DIAGNOSIS — I10 Essential (primary) hypertension: Secondary | ICD-10-CM

## 2021-06-10 DIAGNOSIS — E782 Mixed hyperlipidemia: Secondary | ICD-10-CM

## 2021-06-18 ENCOUNTER — Other Ambulatory Visit: Payer: Self-pay | Admitting: Physician Assistant

## 2021-06-18 ENCOUNTER — Other Ambulatory Visit: Payer: Self-pay | Admitting: Certified Registered Nurse Anesthetist

## 2021-06-18 DIAGNOSIS — M7989 Other specified soft tissue disorders: Secondary | ICD-10-CM

## 2021-06-19 ENCOUNTER — Ambulatory Visit
Admission: RE | Admit: 2021-06-19 | Discharge: 2021-06-19 | Disposition: A | Discharge status: Home / Self Care | Payer: Self-pay | Source: Ambulatory Visit | Attending: Physician Assistant | Admitting: Physician Assistant

## 2021-06-19 DIAGNOSIS — M7989 Other specified soft tissue disorders: Secondary | ICD-10-CM | POA: Insufficient documentation

## 2021-06-21 ENCOUNTER — Ambulatory Visit: Admission: RE | Admit: 2021-06-21 | Payer: Self-pay | Source: Ambulatory Visit

## 2021-06-27 ENCOUNTER — Ambulatory Visit
Admission: RE | Admit: 2021-06-27 | Discharge: 2021-06-27 | Disposition: A | Discharge status: Home / Self Care | Payer: Self-pay | Source: Ambulatory Visit | Attending: Cardiology | Admitting: Cardiology

## 2021-06-27 DIAGNOSIS — I10 Essential (primary) hypertension: Secondary | ICD-10-CM

## 2021-06-27 DIAGNOSIS — I1A0 Resistant hypertension: Secondary | ICD-10-CM

## 2021-06-27 DIAGNOSIS — E782 Mixed hyperlipidemia: Secondary | ICD-10-CM

## 2021-08-11 IMAGING — US US ABDOMEN LIMITED RUQ/ASCITES
1 series · 14 of 25 positions shown · non-contrast
Comparison: None.

CLINICAL DATA: Abdominal pain x2 days.

EXAM:
ULTRASOUND ABDOMEN LIMITED RIGHT UPPER QUADRANT

[Series 1: us abdomen limited ruq (liver/gb) · 14 of 76 slices shown]
[im 1/76]
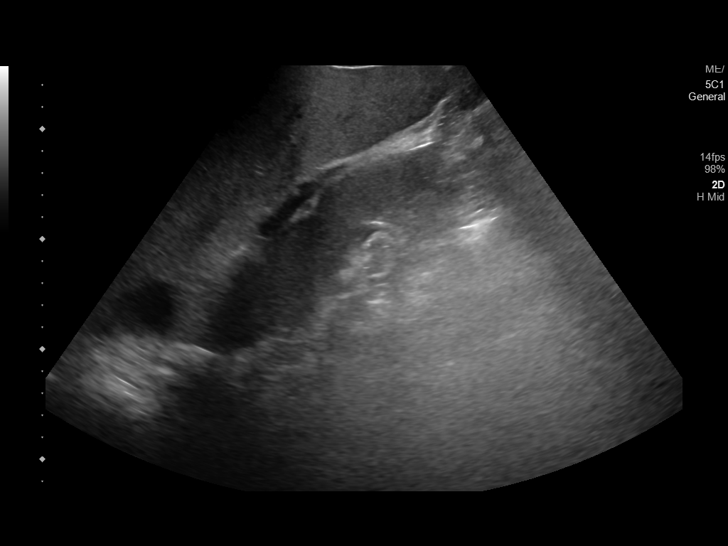
[im 7/76]
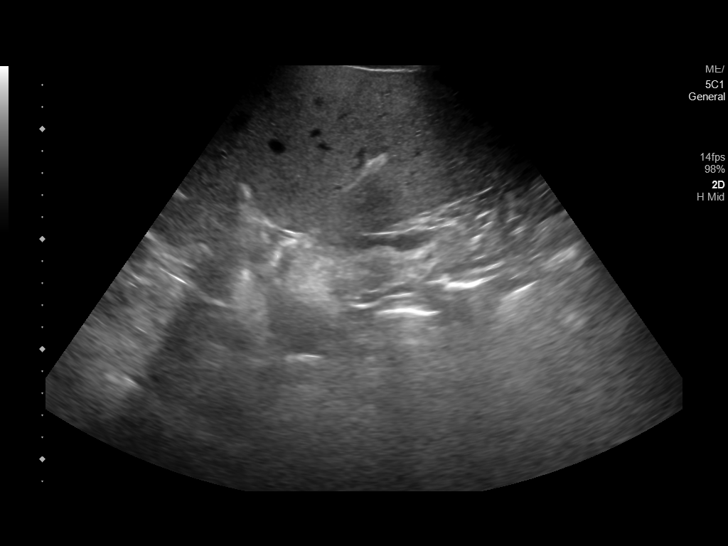
[im 13/76]
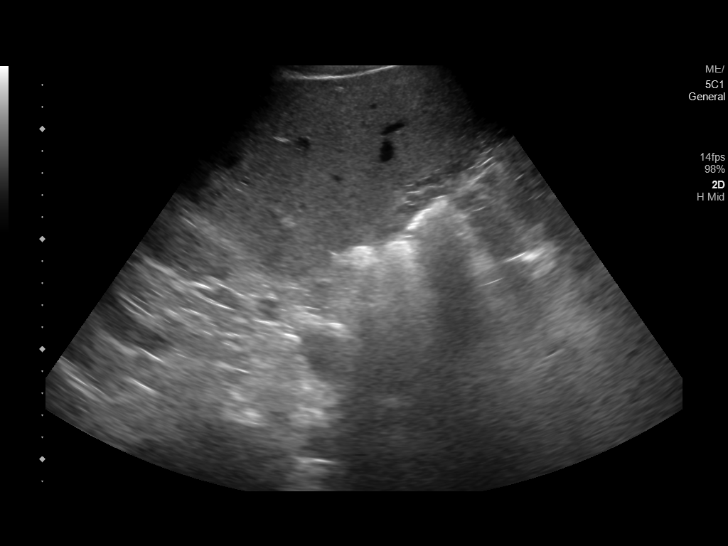
[im 19/76]
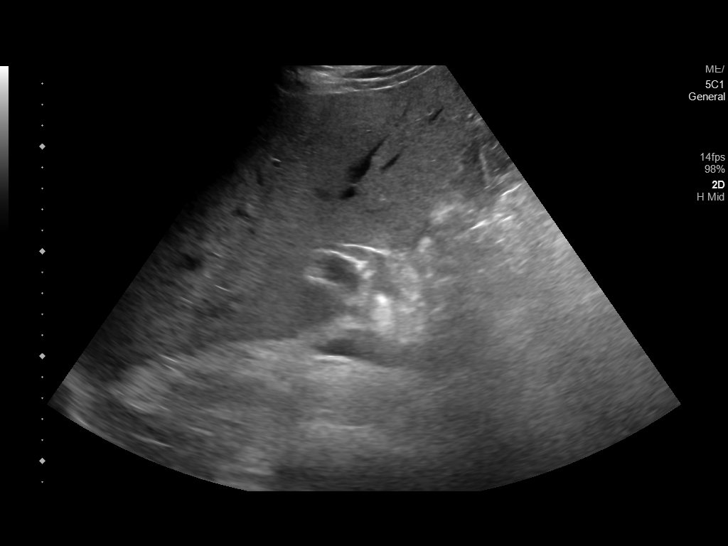
[im 26/76]
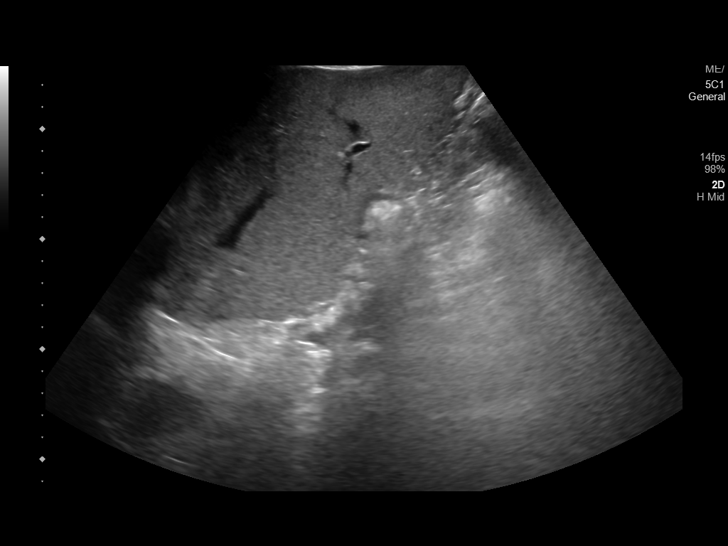
[im 29/76]
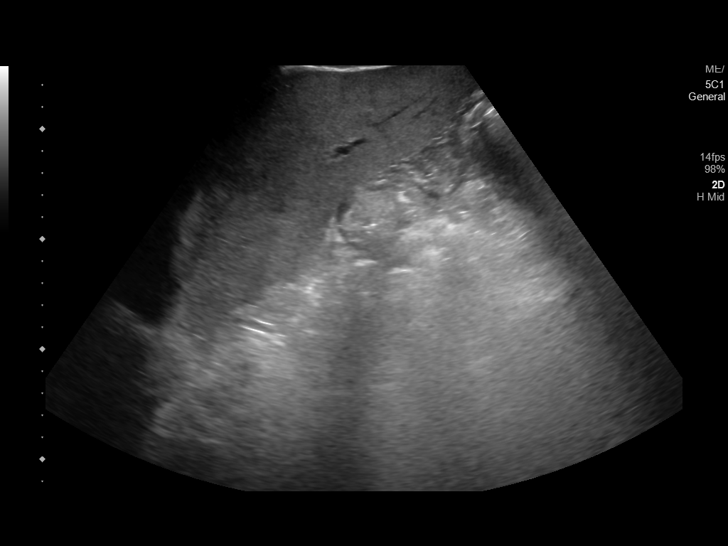
[im 35/76]
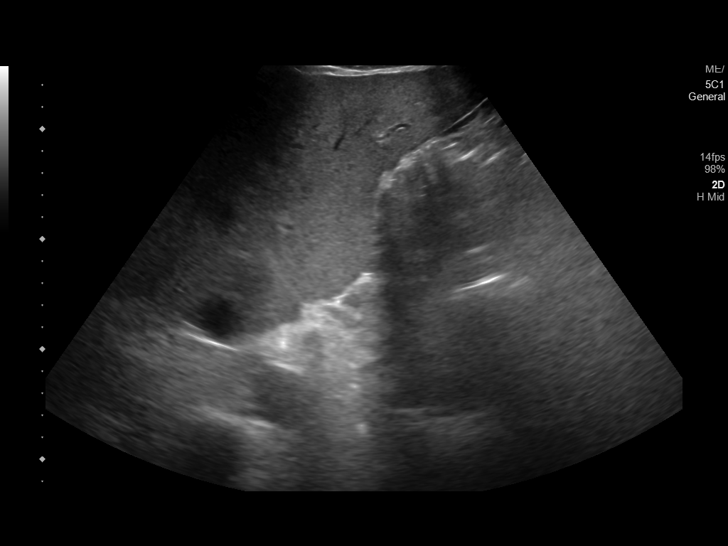
[im 41/76]
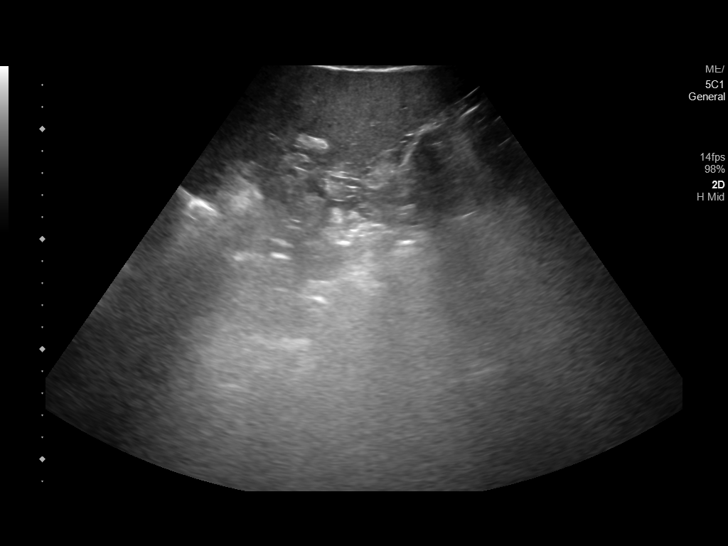
[im 47/76]
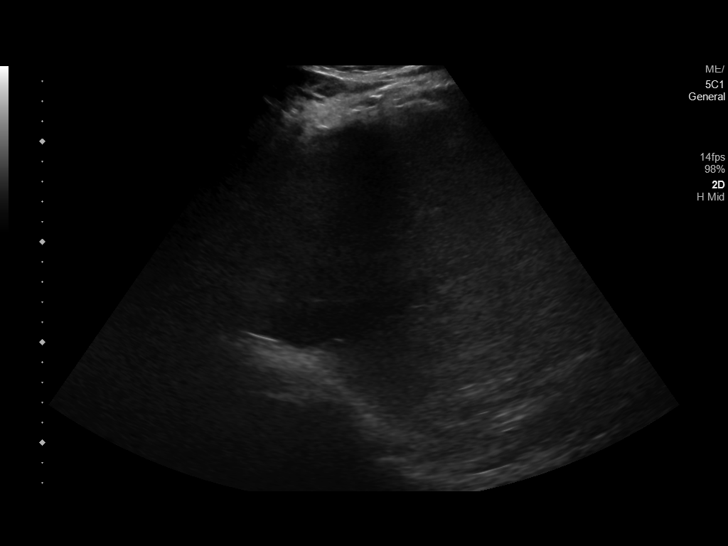
[im 51/76]
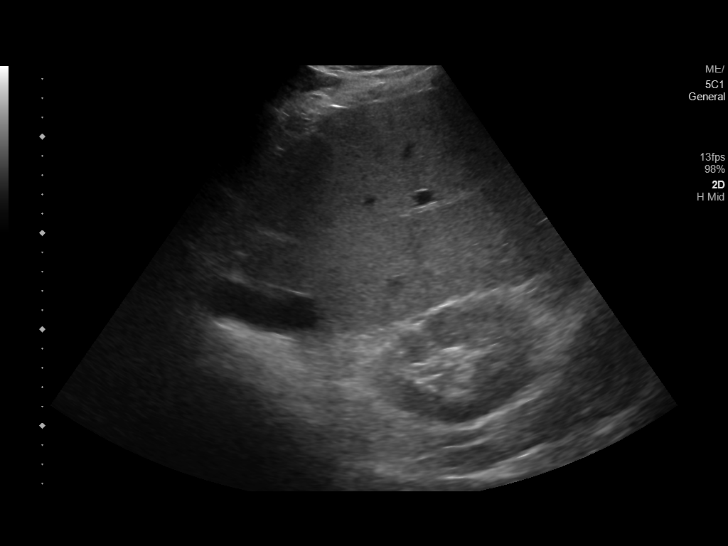
[im 57/76]
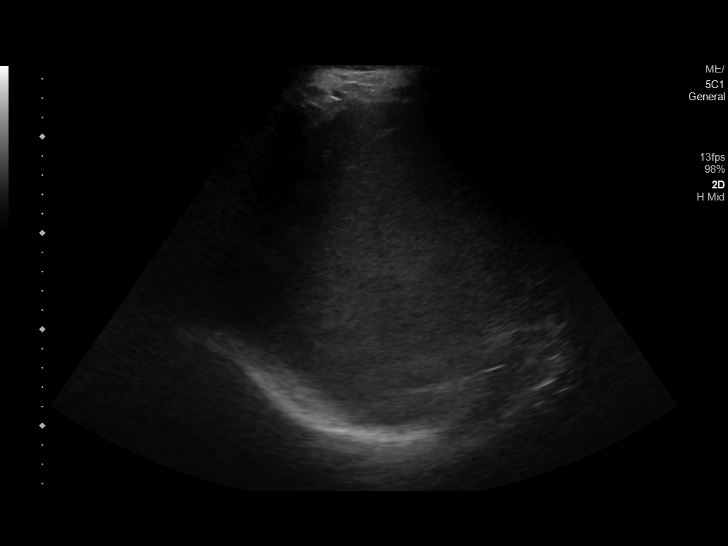
[im 63/76]
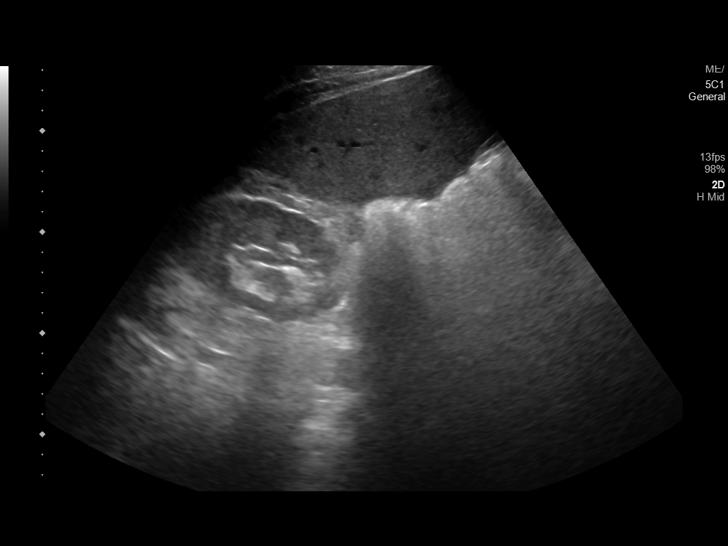
[im 69/76]
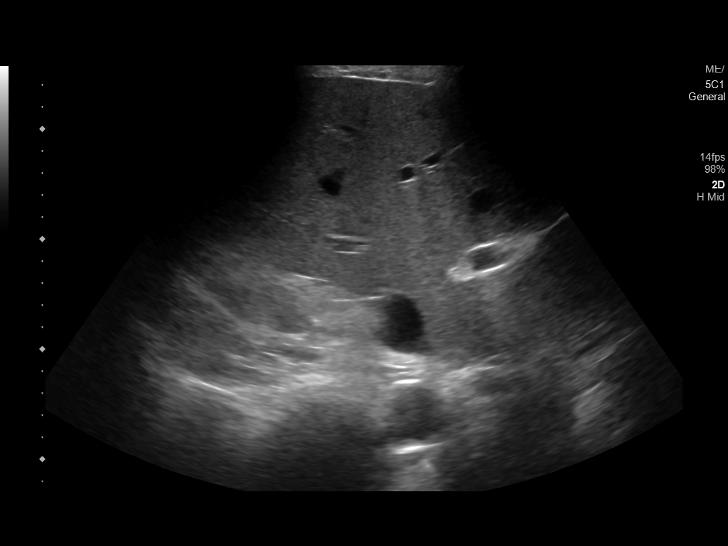
[im 76/76]
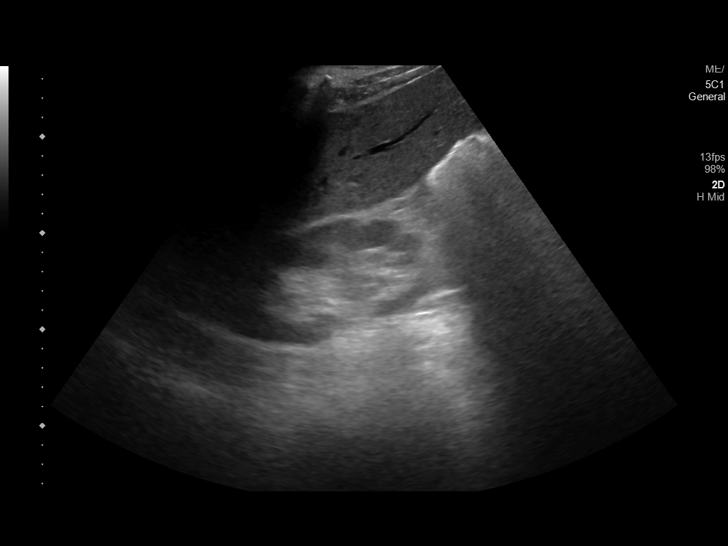

[14 of 25 positions shown; findings below may reference images not displayed]

FINDINGS: Gallbladder:

The gallbladder is not visualized.

Common bile duct:

Diameter: 4.9 mm

Liver:

No focal lesion identified. Diffusely increased echogenicity of the
liver parenchyma is seen. Portal vein is patent on color Doppler
imaging with normal direction of blood flow towards the liver.

Other: None.
IMPRESSION: 1. Nonvisualization of the gallbladder. This may be markedly
contracted, as the patient ate 3 hours prior to the exam.
2. Fatty liver.

## 2021-12-03 ENCOUNTER — Other Ambulatory Visit: Payer: Self-pay | Admitting: Physician Assistant

## 2021-12-03 ENCOUNTER — Ambulatory Visit
Admission: RE | Admit: 2021-12-03 | Discharge: 2021-12-03 | Disposition: A | Discharge status: Home / Self Care | Payer: Self-pay | Source: Ambulatory Visit | Attending: Physician Assistant | Admitting: Physician Assistant

## 2021-12-03 DIAGNOSIS — M7989 Other specified soft tissue disorders: Secondary | ICD-10-CM | POA: Insufficient documentation

## 2021-12-23 ENCOUNTER — Other Ambulatory Visit: Payer: Self-pay

## 2021-12-26 ENCOUNTER — Ambulatory Visit: Payer: Self-pay | Admitting: Urology

## 2022-01-07 ENCOUNTER — Other Ambulatory Visit: Payer: Self-pay | Admitting: *Deleted

## 2022-01-07 DIAGNOSIS — N200 Calculus of kidney: Secondary | ICD-10-CM

## 2022-01-08 ENCOUNTER — Ambulatory Visit (INDEPENDENT_AMBULATORY_CARE_PROVIDER_SITE_OTHER): Payer: Self-pay | Admitting: Urology

## 2022-01-08 ENCOUNTER — Encounter: Payer: Self-pay | Admitting: Urology

## 2022-01-08 ENCOUNTER — Ambulatory Visit
Admission: RE | Admit: 2022-01-08 | Discharge: 2022-01-08 | Disposition: A | Discharge status: Home / Self Care | Payer: Self-pay | Source: Ambulatory Visit | Attending: Urology | Admitting: Urology

## 2022-01-08 VITALS — BP 196/116 | HR 81 | Ht 70.0 in | Wt 280.0 lb

## 2022-01-08 DIAGNOSIS — N200 Calculus of kidney: Secondary | ICD-10-CM

## 2022-01-08 DIAGNOSIS — Z125 Encounter for screening for malignant neoplasm of prostate: Secondary | ICD-10-CM

## 2022-01-08 NOTE — Progress Notes (Signed)
   01/08/2022 3:37 PM   [name] 1955/10/14 [id number]  Reason for visit: Follow up BPH, PSA screening, nephrolithiasis  HPI: 66 year old male with family history of prostate cancer in his brother(type and treatment unknown), asymptomatic 2 cm right lower pole stone without hydronephrosis, and 110 g prostate on CT scan with minimal BPH symptoms.  He really denies any urinary complaints today.  He denies gross hematuria.  No history of UTIs or urinary retention.    PSA has been stable, most recently 3.7 in October 2023 from 3.75 in December 2022.  Anticipate high normal or mildly elevated PSA with his enlarged prostate.  I personally viewed and interpreted his KUB today that shows a stable 2 cm right lower pole stone, on chart review this has been present since at least ~2006.  We again reviewed options including surveillance or ureteroscopy, and he opts to continue surveillance.   RTC 1 year with KUB prior, PVR, continue PSA monitoring with labs via PCP  Billey Co, MD  [facility] [address] [place], [place] [address] [phone]

## 2022-01-08 NOTE — Patient Instructions (Signed)

## 2022-01-29 ENCOUNTER — Other Ambulatory Visit: Payer: Self-pay | Admitting: Family Medicine

## 2022-01-29 DIAGNOSIS — M5416 Radiculopathy, lumbar region: Secondary | ICD-10-CM

## 2022-02-11 ENCOUNTER — Ambulatory Visit
Admission: RE | Admit: 2022-02-11 | Discharge: 2022-02-11 | Disposition: A | Discharge status: Home / Self Care | Payer: Self-pay | Source: Ambulatory Visit | Attending: Physician Assistant | Admitting: Physician Assistant

## 2022-02-11 ENCOUNTER — Other Ambulatory Visit: Payer: Self-pay | Admitting: Physician Assistant

## 2022-02-11 DIAGNOSIS — R519 Headache, unspecified: Secondary | ICD-10-CM | POA: Insufficient documentation

## 2022-04-10 ENCOUNTER — Other Ambulatory Visit: Payer: Self-pay | Admitting: Family Medicine

## 2022-04-10 DIAGNOSIS — R1011 Right upper quadrant pain: Secondary | ICD-10-CM

## 2022-04-24 ENCOUNTER — Other Ambulatory Visit: Payer: Self-pay

## 2022-04-29 ENCOUNTER — Ambulatory Visit
Admission: RE | Admit: 2022-04-29 | Discharge: 2022-04-29 | Disposition: A | Discharge status: Home / Self Care | Payer: Self-pay | Source: Ambulatory Visit | Attending: Family Medicine | Admitting: Family Medicine

## 2022-04-29 DIAGNOSIS — R1011 Right upper quadrant pain: Secondary | ICD-10-CM

## 2022-04-29 MED ORDER — IOPAMIDOL (ISOVUE-300) INJECTION 61%
100.0000 mL | Freq: Once | INTRAVENOUS | Status: AC | PRN
  Administered 2022-04-29: 100 mL via INTRAVENOUS

## 2022-05-27 ENCOUNTER — Telehealth: Payer: Self-pay

## 2022-05-27 ENCOUNTER — Other Ambulatory Visit: Payer: Self-pay

## 2022-05-27 DIAGNOSIS — Z860100 Personal history of colon polyps, unspecified: Secondary | ICD-10-CM

## 2022-05-27 NOTE — Telephone Encounter (Signed)
Gastroenterology Pre-Procedure Review  Request Date: 06/17/22 Requesting Physician: Dr. Allen Norris  PATIENT REVIEW QUESTIONS: The patient responded to the following health history questions as indicated:    1. Are you having any GI issues? no 2. Do you have a personal history of Polyps? yes (last colonoscopy performed by Dr. Allen Norris on 01/11/2019) 3. Do you have a family history of Colon Cancer or Polyps? no 4. Diabetes Mellitus? Yes patient has been advised to stop Glipizide on 06/16/22.  Stop Metformin on 04/07. 5. Joint replacements in the past 12 months?no 6. Major health problems in the past 3 months?no 7. Any artificial heart valves, MVP, or defibrillator?no    MEDICATIONS & ALLERGIES:    Patient reports the following regarding taking any anticoagulation/antiplatelet therapy:   Plavix, Coumadin, Eliquis, Xarelto, Lovenox, Pradaxa, Brilinta, or Effient? no Aspirin? no  Patient confirms/reports the following medications:  Current Outpatient Medications  Medication Sig Dispense Refill   atorvastatin (LIPITOR) 40 MG tablet Take 40 mg by mouth daily.     cloNIDine (CATAPRES) 0.2 MG tablet Take 0.2 mg by mouth 2 (two) times daily.     furosemide (LASIX) 20 MG tablet Take by mouth.     glipiZIDE (GLUCOTROL XL) 10 MG 24 hr tablet Take 10 mg by mouth daily.     isosorbide mononitrate (IMDUR) 60 MG 24 hr tablet Take 1 tablet by mouth daily.     labetalol (NORMODYNE) 200 MG tablet Take 200 mg by mouth 2 (two) times daily.     liraglutide (VICTOZA) 18 MG/3ML SOPN Inject into the skin.     losartan (COZAAR) 100 MG tablet Take by mouth.     meloxicam (MOBIC) 7.5 MG tablet Take 7.5 mg by mouth daily.     metFORMIN (GLUCOPHAGE) 1000 MG tablet Take by mouth.     No current facility-administered medications for this visit.    Patient confirms/reports the following allergies:  No Known Allergies  No orders of the defined types were placed in this encounter.   AUTHORIZATION INFORMATION Primary  Insurance: 1D#: Group #:  Secondary Insurance: 1D#: Group #:  SCHEDULE INFORMATION: Date: 06/17/22 Time: Location: [facility]

## 2022-06-04 ENCOUNTER — Other Ambulatory Visit: Payer: Self-pay | Admitting: Nephrology

## 2022-06-04 DIAGNOSIS — I10 Essential (primary) hypertension: Secondary | ICD-10-CM

## 2022-06-09 ENCOUNTER — Other Ambulatory Visit
Admission: RE | Admit: 2022-06-09 | Discharge: 2022-06-09 | Disposition: A | Discharge status: Home / Self Care | Payer: Self-pay | Source: Home / Self Care | Attending: Nephrology | Admitting: Nephrology

## 2022-06-09 ENCOUNTER — Ambulatory Visit
Admission: RE | Admit: 2022-06-09 | Discharge: 2022-06-09 | Disposition: A | Discharge status: Home / Self Care | Payer: Self-pay | Source: Ambulatory Visit | Attending: Nephrology | Admitting: Nephrology

## 2022-06-09 DIAGNOSIS — I10 Essential (primary) hypertension: Secondary | ICD-10-CM | POA: Insufficient documentation

## 2022-06-09 LAB — TSH: TSH: 1.752 u[IU]/mL (ref 0.350–4.500)

## 2022-06-09 LAB — T4, FREE: Free T4: 0.63 ng/dL (ref 0.61–1.12)

## 2022-06-10 NOTE — Telephone Encounter (Signed)
Patient stated he did not receive his instructions.  Instructions revised due to prep changed to ClenPiq as well.  I will be providing him with a sample.  He stated he does not take Victoza only Metformin and Glipizide.  Stop dates noted on instructions and I will go over instructions with him in person when he comes in.  Thanks, Mitchell Robbins, [place]

## 2022-06-14 LAB — ALDOSTERONE + RENIN ACTIVITY W/ RATIO
ALDO / PRA Ratio: 13.3 (ref 0.0–30.0)
Aldosterone: 5.9 ng/dL (ref 0.0–30.0)
PRA LC/MS/MS: 0.443 ng/mL/hr (ref 0.167–5.380)

## 2022-06-16 ENCOUNTER — Encounter: Payer: Self-pay | Admitting: Gastroenterology

## 2022-06-17 ENCOUNTER — Ambulatory Visit: Payer: Self-pay | Admitting: Anesthesiology

## 2022-06-17 ENCOUNTER — Encounter
Admission: RE | Disposition: A | Discharge status: Home / Self Care | Payer: Self-pay | Source: Home / Self Care | Attending: Gastroenterology

## 2022-06-17 ENCOUNTER — Encounter: Payer: Self-pay | Admitting: Gastroenterology

## 2022-06-17 ENCOUNTER — Ambulatory Visit
Admission: RE | Admit: 2022-06-17 | Discharge: 2022-06-17 | Disposition: A | Discharge status: Home / Self Care | Payer: Self-pay | Attending: Gastroenterology | Admitting: Gastroenterology

## 2022-06-17 DIAGNOSIS — I10 Essential (primary) hypertension: Secondary | ICD-10-CM | POA: Insufficient documentation

## 2022-06-17 DIAGNOSIS — D120 Benign neoplasm of cecum: Secondary | ICD-10-CM | POA: Insufficient documentation

## 2022-06-17 DIAGNOSIS — Z8711 Personal history of peptic ulcer disease: Secondary | ICD-10-CM | POA: Insufficient documentation

## 2022-06-17 DIAGNOSIS — K635 Polyp of colon: Secondary | ICD-10-CM | POA: Insufficient documentation

## 2022-06-17 DIAGNOSIS — D123 Benign neoplasm of transverse colon: Secondary | ICD-10-CM | POA: Insufficient documentation

## 2022-06-17 DIAGNOSIS — Z7984 Long term (current) use of oral hypoglycemic drugs: Secondary | ICD-10-CM | POA: Insufficient documentation

## 2022-06-17 DIAGNOSIS — E119 Type 2 diabetes mellitus without complications: Secondary | ICD-10-CM | POA: Insufficient documentation

## 2022-06-17 DIAGNOSIS — D122 Benign neoplasm of ascending colon: Secondary | ICD-10-CM | POA: Insufficient documentation

## 2022-06-17 DIAGNOSIS — Z8249 Family history of ischemic heart disease and other diseases of the circulatory system: Secondary | ICD-10-CM | POA: Insufficient documentation

## 2022-06-17 DIAGNOSIS — Z1211 Encounter for screening for malignant neoplasm of colon: Secondary | ICD-10-CM | POA: Insufficient documentation

## 2022-06-17 DIAGNOSIS — Z833 Family history of diabetes mellitus: Secondary | ICD-10-CM | POA: Insufficient documentation

## 2022-06-17 DIAGNOSIS — Z86010 Personal history of colonic polyps: Secondary | ICD-10-CM | POA: Insufficient documentation

## 2022-06-17 DIAGNOSIS — F17210 Nicotine dependence, cigarettes, uncomplicated: Secondary | ICD-10-CM | POA: Insufficient documentation

## 2022-06-17 HISTORY — PX: COLONOSCOPY WITH PROPOFOL: SHX5780

## 2022-06-17 LAB — GLUCOSE, CAPILLARY: Glucose-Capillary: 122 mg/dL — ABNORMAL HIGH (ref 70–99)

## 2022-06-17 SURGERY — COLONOSCOPY WITH PROPOFOL
Anesthesia: General

## 2022-06-17 MED ORDER — LIDOCAINE HCL (PF) 2 % IJ SOLN
INTRAMUSCULAR | Status: AC
  Filled 2022-06-17: qty 5

## 2022-06-17 MED ORDER — LABETALOL HCL 5 MG/ML IV SOLN
INTRAVENOUS | Status: AC
  Filled 2022-06-17: qty 4

## 2022-06-17 MED ORDER — LIDOCAINE HCL (CARDIAC) PF 100 MG/5ML IV SOSY
PREFILLED_SYRINGE | INTRAVENOUS | Status: DC | PRN
  Administered 2022-06-17: 100 mg via INTRAVENOUS

## 2022-06-17 MED ORDER — PROPOFOL 10 MG/ML IV BOLUS
INTRAVENOUS | Status: AC
  Filled 2022-06-17: qty 40

## 2022-06-17 MED ORDER — PROPOFOL 10 MG/ML IV BOLUS
INTRAVENOUS | Status: DC | PRN
  Administered 2022-06-17: 90 ug/kg/min via INTRAVENOUS
  Administered 2022-06-17: 100 mg via INTRAVENOUS

## 2022-06-17 MED ORDER — LABETALOL HCL 5 MG/ML IV SOLN
INTRAVENOUS | Status: DC | PRN
  Administered 2022-06-17 (×3): 5 mg via INTRAVENOUS

## 2022-06-17 MED ORDER — SODIUM CHLORIDE 0.9 % IV SOLN: INTRAVENOUS | Status: DC

## 2022-06-17 NOTE — Anesthesia Postprocedure Evaluation (Signed)
Anesthesia Post Note  Patient: Rileigh Dudenhoeffer  Procedure(s) Performed: COLONOSCOPY WITH PROPOFOL  Patient location during evaluation: Endoscopy Anesthesia Type: General Level of consciousness: awake and alert Pain management: pain level controlled Vital Signs Assessment: post-procedure vital signs reviewed and stable Respiratory status: spontaneous breathing, nonlabored ventilation and respiratory function stable Cardiovascular status: blood pressure returned to baseline and stable Postop Assessment: no apparent nausea or vomiting Anesthetic complications: no   No notable events documented.   Last Vitals:  Vitals:   06/17/22 1102 06/17/22 1112  BP: (!) 147/104 (!) 161/72  Pulse: 87 93  Resp: (!) 21 (!) 23  Temp: (!) 36 C   SpO2: 100% 100%    Last Pain:  Vitals:   06/17/22 1112  TempSrc:   PainSc: 0-No pain                 Foye Deer

## 2022-06-17 NOTE — Transfer of Care (Signed)
Immediate Anesthesia Transfer of Care Note  Patient: Mitchell Robbins  Procedure(s) Performed: COLONOSCOPY WITH PROPOFOL  Patient Location: PACU  Anesthesia Type:General  Level of Consciousness: drowsy  Airway & Oxygen Therapy: Patient Spontanous Breathing  Post-op Assessment: Report given to RN and Post -op Vital signs reviewed and stable  Post vital signs: Reviewed and stable  Last Vitals:  Vitals Value Taken Time  BP 147/104 06/17/22 1102  Temp 36 C 06/17/22 1102  Pulse 88 06/17/22 1107  Resp 20 06/17/22 1107  SpO2 100 % 06/17/22 1107  Vitals shown include unvalidated device data.  Last Pain:  Vitals:   06/17/22 1102  TempSrc:   PainSc: Asleep         Complications: No notable events documented.

## 2022-06-17 NOTE — Anesthesia Preprocedure Evaluation (Addendum)
Anesthesia Evaluation  Patient identified by MRN, date of birth, ID band Patient awake    Reviewed: Allergy & Precautions, H&P , NPO status , Patient's Chart, lab work & pertinent test results  History of Anesthesia Complications Negative for: history of anesthetic complications  Airway Mallampati: III  TM Distance: >3 FB Neck ROM: full   Comment: Large head and jaw Dental  (+) Missing, Chipped, Poor Dentition   Pulmonary neg shortness of breath, neg COPD, neg recent URI, Current SmokerPatient did not abstain from smoking.   breath sounds clear to auscultation       Cardiovascular Exercise Tolerance: Poor hypertension, + angina (Pt does not clearly articulate when he has chest pain, but states it does not occur often and sometimes occurs with certain positions)  (-) Past MI and (-) Cardiac Stents + dysrhythmias Atrial Fibrillation (-) Valvular Problems/Murmurs Rhythm:regular     Neuro/Psych negative neurological ROS  negative psych ROS   GI/Hepatic Neg liver ROS, PUD,,,  Endo/Other  diabetes, Type 2    Renal/GU negative Renal ROS  negative genitourinary   Musculoskeletal   Abdominal  (+) + obese  Peds  Hematology negative hematology ROS (+)   Anesthesia Other Findings Past Medical History: No date: Hypertension   Reproductive/Obstetrics negative OB ROS                             Anesthesia Physical Anesthesia Plan  ASA: 3  Anesthesia Plan: General   Post-op Pain Management:    Induction:   PONV Risk Score and Plan: Propofol infusion and TIVA  Airway Management Planned: Natural Airway  Additional Equipment:   Intra-op Plan:   Post-operative Plan:   Informed Consent: I have reviewed the patients History and Physical, chart, labs and discussed the procedure including the risks, benefits and alternatives for the proposed anesthesia with the patient or authorized representative  who has indicated his/her understanding and acceptance.     Dental Advisory Given  Plan Discussed with: Anesthesiologist, CRNA and Surgeon  Anesthesia Plan Comments:         Anesthesia Quick Evaluation

## 2022-06-17 NOTE — Op Note (Signed)
[facility] Gastroenterology Patient Name: Mitchell Robbins Procedure Date: 06/17/2022 9:54 AM MRN: [id number] Account #: [id number] Date of Birth: February 24, 1956 Admit Type: Outpatient Age: 67 Room: [facility] ENDO ROOM 1 Gender: Male Note Status: Finalized Instrument Name: Prentice Docker [id number] Procedure:             Colonoscopy Indications:           High risk colon cancer surveillance: Personal history                         of colonic polyps Providers:             Midge Minium MD, MD Medicines:             Propofol per Anesthesia Complications:         No immediate complications. Procedure:             Pre-Anesthesia Assessment:                        - Prior to the procedure, a History and Physical was                         performed, and patient medications and allergies were                         reviewed. The patient's tolerance of previous                         anesthesia was also reviewed. The risks and benefits                         of the procedure and the sedation options and risks                         were discussed with the patient. All questions were                         answered, and informed consent was obtained. Prior                         Anticoagulants: The patient has taken no anticoagulant                         or antiplatelet agents. ASA Grade Assessment: II - A                         patient with mild systemic disease. After reviewing                         the risks and benefits, the patient was deemed in                         satisfactory condition to undergo the procedure.                        After obtaining informed consent, the colonoscope was                         passed under direct vision. Throughout  the procedure,                         the patient's blood pressure, pulse, and oxygen                         saturations were monitored continuously. The                         Colonoscope was introduced through the anus  and                         advanced to the the cecum, identified by appendiceal                         orifice and ileocecal valve. The colonoscopy was                         performed without difficulty. The patient tolerated                         the procedure well. The quality of the bowel                         preparation was excellent. Findings:      The perianal and digital rectal examinations were normal.      A 2 mm polyp was found in the cecum. The polyp was sessile. The polyp       was removed with a cold biopsy forceps. Resection and retrieval were       complete.      A 5 mm polyp was found in the ascending colon. The polyp was sessile.       The polyp was removed with a cold snare. Resection and retrieval were       complete.      Two sessile polyps were found in the transverse colon. The polyps were 4       to 8 mm in size. Preparations were made for mucosal resection.       Chromoscopy with indigo carmine was done. Demarcation of the lesion was       performed during the procedure to clearly identify the boundaries of the       lesion. Saline with indigo carmine was injected to raise the lesion.       Snare mucosal resection was performed. Resection and retrieval were       complete. Resected tissue margins were examined and clear of polyp       tissue.      A 4 mm polyp was found in the transverse colon. The polyp was sessile.       The polyp was removed with a cold snare. Resection and retrieval were       complete.      A 5 mm polyp was found in the sigmoid colon. The polyp was sessile. The       polyp was removed with a cold snare. Resection and retrieval were       complete.      An 8 mm polyp was found in the sigmoid colon. The polyp was       pedunculated. The polyp was removed with a hot snare. Resection and  retrieval were complete. Impression:            - One 2 mm polyp in the cecum, removed with a cold                         biopsy forceps.  Resected and retrieved.                        - One 5 mm polyp in the ascending colon, removed with                         a cold snare. Resected and retrieved.                        - Two 4 to 8 mm polyps in the transverse colon,                         removed with mucosal resection. Resected and retrieved.                        - One 4 mm polyp in the transverse colon, removed with                         a cold snare. Resected and retrieved.                        - One 5 mm polyp in the sigmoid colon, removed with a                         cold snare. Resected and retrieved.                        - One 8 mm polyp in the sigmoid colon, removed with a                         hot snare. Resected and retrieved.                        - Mucosal resection was performed. Resection and                         retrieval were complete. Recommendation:        - Discharge patient to home.                        - Resume previous diet.                        - Continue present medications.                        - Await pathology results.                        - Repeat colonoscopy in 3 years for surveillance. Procedure Code(s):     --- Professional ---                        [id number], Colonoscopy, flexible; with endoscopic mucosal  resection                        [id number], 59, Colonoscopy, flexible; with removal of                         tumor(s), polyp(s), or other lesion(s) by snare                         technique                        [id number], 59, Colonoscopy, flexible; with biopsy, single                         or multiple Diagnosis Code(s):     --- Professional ---                        Z86.010, Personal history of colonic polyps                        D12.0, Benign neoplasm of cecum CPT copyright 2022 [employer]. All rights reserved. The codes documented in this report are preliminary and upon coder review may  be revised to meet current  compliance requirements. Midge Minium MD, MD 06/17/2022 11:03:19 AM This report has been signed electronically. Number of Addenda: 0 Note Initiated On: 06/17/2022 9:54 AM Scope Withdrawal Time: 0 hours 23 minutes 8 seconds  Total Procedure Duration: 0 hours 37 minutes 23 seconds  Estimated Blood Loss:  Estimated blood loss: none.      [facility]

## 2022-06-17 NOTE — H&P (Signed)
Midge Minium, MD [facility] [address], [address] [place], [place] [address] Phone:[phone] Fax : [phone]  Primary Care Physician:  [facility] Primary Gastroenterologist:  Dr. Servando Snare  Pre-Procedure History & Physical: HPI:  Kaaden Bencosme is a 67 y.o. male is here for an colonoscopy.   Past Medical History:  Diagnosis Date   Diabetes mellitus without complication    Hypertension     Past Surgical History:  Procedure Laterality Date   COLONOSCOPY WITH PROPOFOL N/A 01/21/2018   Procedure: COLONOSCOPY WITH BIOPSIES;  Surgeon: Midge Minium, MD;  Location: [facility];  Service: Endoscopy;  Laterality: N/A;   COLONOSCOPY WITH PROPOFOL N/A 01/11/2019   Procedure: COLONOSCOPY WITH PROPOFOL;  Surgeon: Midge Minium, MD;  Location: [facility] ENDOSCOPY;  Service: Endoscopy;  Laterality: N/A;   ESOPHAGOGASTRODUODENOSCOPY (EGD) WITH PROPOFOL N/A 12/24/2017   Procedure: ESOPHAGOGASTRODUODENOSCOPY (EGD) WITH PROPOFOL;  Surgeon: Midge Minium, MD;  Location: [facility] ENDOSCOPY;  Service: Endoscopy;  Laterality: N/A;   ESOPHAGOGASTRODUODENOSCOPY (EGD) WITH PROPOFOL N/A 01/21/2018   Procedure: ESOPHAGOGASTRODUODENOSCOPY (EGD) WITH BIOPSIES;  Surgeon: Midge Minium, MD;  Location: [facility];  Service: Endoscopy;  Laterality: N/A;   POLYPECTOMY N/A 01/21/2018   Procedure: POLYPECTOMY;  Surgeon: Midge Minium, MD;  Location: [facility];  Service: Endoscopy;  Laterality: N/A;   THYROIDECTOMY      Prior to Admission medications   Medication Sig Start Date End Date Taking? Authorizing Provider  atorvastatin (LIPITOR) 40 MG tablet Take 40 mg by mouth daily. 12/17/21  Yes [provider]  cloNIDine (CATAPRES) 0.2 MG tablet Take 0.2 mg by mouth 2 (two) times daily. 03/27/20  Yes [provider]  furosemide (LASIX) 20 MG tablet Take by mouth. 12/30/21 12/30/22 Yes [provider]  labetalol (NORMODYNE) 200 MG tablet Take 200 mg by mouth 2 (two)  times daily. 04/26/20  Yes [provider]  losartan (COZAAR) 100 MG tablet Take by mouth. 07/15/21  Yes [provider]  meloxicam (MOBIC) 7.5 MG tablet Take 7.5 mg by mouth daily. 12/08/21  Yes [provider]  glipiZIDE (GLUCOTROL XL) 10 MG 24 hr tablet Take 10 mg by mouth daily. 10/21/21   [provider]  isosorbide mononitrate (IMDUR) 60 MG 24 hr tablet Take 1 tablet by mouth daily. 12/05/20 12/05/21  [provider]  liraglutide (VICTOZA) 18 MG/3ML SOPN Inject into the skin. 01/06/22 02/05/22  [provider]  metFORMIN (GLUCOPHAGE) 1000 MG tablet Take by mouth. 07/15/21 07/15/22  [provider]    Allergies as of 05/28/2022   (No Known Allergies)    Family History  Problem Relation Age of Onset   Diabetes Mother    Hypertension Mother    Diabetes Brother     Social History   Socioeconomic History   Marital status: Single    Spouse name: Not on file   Number of children: Not on file   Years of education: Not on file   Highest education level: Not on file  Occupational History   Occupation: truck driver  Tobacco Use   Smoking status: Every Day    Packs/day: 1.00    Years: 40.00    Additional pack years: 0.00    Total pack years: 40.00    Types: Cigarettes    Passive exposure: Current   Smokeless tobacco: Never  Vaping Use   Vaping Use: Never used  Substance and Sexual Activity   Alcohol use: No    Comment: quit drinking alcohol long time ago   Drug  use: No   Sexual activity: Not on file  Other Topics Concern   Not on file  Social History Narrative   Not on file   Social Determinants of Health   Financial Resource Strain: Not on file  Food Insecurity: Not on file  Transportation Needs: Not on file  Physical Activity: Not on file  Stress: Not on file  Social Connections: Not on file  Intimate Partner Violence: Not on file    Review of Systems: See HPI, otherwise negative ROS  Physical Exam: BP  (!) 186/114   Pulse 72   Temp (!) 97.5 F (36.4 C) (Temporal)   Resp 18   Ht 5\' 11"  (1.803 m)   Wt 128.2 kg   SpO2 100%   BMI 39.41 kg/m  General:   Alert,  pleasant and cooperative in NAD Head:  Normocephalic and atraumatic. Neck:  Supple; no masses or thyromegaly. Lungs:  Clear throughout to auscultation.    Heart:  Regular rate and rhythm. Abdomen:  Soft, nontender and nondistended. Normal bowel sounds, without guarding, and without rebound.   Neurologic:  Alert and  oriented x4;  grossly normal neurologically.  Impression/Plan: Vivian Purohit is here for an colonoscopy to be performed for a history of adenomatous polyps on 2020   Risks, benefits, limitations, and alternatives regarding  colonoscopy have been reviewed with the patient.  Questions have been answered.  All parties agreeable.   Midge Minium, MD  06/17/2022, 9:33 AM

## 2022-06-18 ENCOUNTER — Encounter: Payer: Self-pay | Admitting: Gastroenterology

## 2022-06-18 LAB — SURGICAL PATHOLOGY

## 2022-06-19 ENCOUNTER — Encounter: Payer: Self-pay | Admitting: Gastroenterology

## 2022-07-03 ENCOUNTER — Encounter: Payer: Self-pay | Admitting: Emergency Medicine

## 2022-07-03 DIAGNOSIS — I10 Essential (primary) hypertension: Secondary | ICD-10-CM | POA: Insufficient documentation

## 2022-07-03 DIAGNOSIS — R2242 Localized swelling, mass and lump, left lower limb: Secondary | ICD-10-CM | POA: Insufficient documentation

## 2022-07-03 DIAGNOSIS — E119 Type 2 diabetes mellitus without complications: Secondary | ICD-10-CM | POA: Insufficient documentation

## 2022-07-03 NOTE — ED Triage Notes (Addendum)
Pt presents ambulatory to triage via POV with complaints of left thigh pain that started 3-4 days ago. Rates the pain 8/10. He notes taking tylenol this AM with no improvement in his pain. Denies redness, swelling, fevers, falls, injury, CP, SOB. A&Ox4 at this time.

## 2022-07-04 ENCOUNTER — Emergency Department
Admission: EM | Admit: 2022-07-04 | Discharge: 2022-07-04 | Disposition: A | Discharge status: Home / Self Care | Payer: Self-pay | Attending: Emergency Medicine | Admitting: Emergency Medicine

## 2022-07-04 ENCOUNTER — Emergency Department: Payer: Self-pay

## 2022-07-04 DIAGNOSIS — R2242 Localized swelling, mass and lump, left lower limb: Secondary | ICD-10-CM

## 2022-07-04 DIAGNOSIS — M79652 Pain in left thigh: Secondary | ICD-10-CM

## 2022-07-04 LAB — CBC WITH DIFFERENTIAL/PLATELET
Abs Immature Granulocytes: 0.02 10*3/uL (ref 0.00–0.07)
Basophils Absolute: 0.1 10*3/uL (ref 0.0–0.1)
Basophils Relative: 1 %
Eosinophils Absolute: 0.2 10*3/uL (ref 0.0–0.5)
Eosinophils Relative: 2 %
HCT: 40.8 % (ref 39.0–52.0)
Hemoglobin: 12.7 g/dL — ABNORMAL LOW (ref 13.0–17.0)
Immature Granulocytes: 0 %
Lymphocytes Relative: 44 %
Lymphs Abs: 4.1 10*3/uL — ABNORMAL HIGH (ref 0.7–4.0)
MCH: 26.6 pg (ref 26.0–34.0)
MCHC: 31.1 g/dL (ref 30.0–36.0)
MCV: 85.4 fL (ref 80.0–100.0)
Monocytes Absolute: 0.5 10*3/uL (ref 0.1–1.0)
Monocytes Relative: 6 %
Neutro Abs: 4.5 10*3/uL (ref 1.7–7.7)
Neutrophils Relative %: 47 %
Platelets: 284 10*3/uL (ref 150–400)
RBC: 4.78 MIL/uL (ref 4.22–5.81)
RDW: 16.6 % — ABNORMAL HIGH (ref 11.5–15.5)
WBC: 9.4 10*3/uL (ref 4.0–10.5)
nRBC: 0 % (ref 0.0–0.2)

## 2022-07-04 LAB — BASIC METABOLIC PANEL
Anion gap: 9 (ref 5–15)
BUN: 20 mg/dL (ref 8–23)
CO2: 28 mmol/L (ref 22–32)
Calcium: 9 mg/dL (ref 8.9–10.3)
Chloride: 103 mmol/L (ref 98–111)
Creatinine, Ser: 1.42 mg/dL — ABNORMAL HIGH (ref 0.61–1.24)
GFR, Estimated: 54 mL/min — ABNORMAL LOW (ref >60)
Glucose, Bld: 79 mg/dL (ref 70–99)
Potassium: 3.6 mmol/L (ref 3.5–5.1)
Sodium: 140 mmol/L (ref 135–145)

## 2022-07-04 LAB — CK: Total CK: 384 U/L (ref 49–397)

## 2022-07-04 MED ORDER — FENTANYL CITRATE PF 50 MCG/ML IJ SOSY
50.0000 ug | PREFILLED_SYRINGE | Freq: Once | INTRAMUSCULAR | Status: AC
  Administered 2022-07-04: 50 ug via INTRAVENOUS
  Filled 2022-07-04: qty 1

## 2022-07-04 MED ORDER — GADOBUTROL 1 MMOL/ML IV SOLN
10.0000 mL | Freq: Once | INTRAVENOUS | Status: AC | PRN
  Administered 2022-07-04: 10 mL via INTRAVENOUS

## 2022-07-04 MED ORDER — ACETAMINOPHEN 500 MG PO TABS
1000.0000 mg | ORAL_TABLET | Freq: Once | ORAL | Status: AC
  Administered 2022-07-04: 1000 mg via ORAL
  Filled 2022-07-04: qty 2

## 2022-07-04 NOTE — ED Provider Notes (Addendum)
[facility] Provider Note    Event Date/Time   First MD Initiated Contact with Patient 07/04/22 0050     (approximate)   History   Leg Pain   HPI  Mitchell Robbins is a 67 y.o. male   Past medical history of diabetes and hypertension presents the emergency department with left thigh pain.  Atraumatic no obvious inciting event.  Over the last 4 days he has experienced the symptoms and is constant unchanged and since it has not gone away with conservative management at home including Tylenol he decided to come get it checked out.  He is ambulatory.  He has no other acute medical complaints and denies chest pain, respiratory symptoms, fevers.    He has no changes in medications.  He does take a statin.  External Medical Documents Reviewed: Emergency department visit dated February 2022 for abdominal pain documenting his past medical history      Physical Exam   Triage Vital Signs: ED Triage Vitals  Enc Vitals Group     BP 07/03/22 2329 (!) 163/96     Pulse Rate 07/03/22 2329 86     Resp 07/03/22 2329 20     Temp 07/03/22 2329 98.3 F (36.8 C)     Temp Source 07/03/22 2329 Oral     SpO2 07/03/22 2329 96 %     Weight 07/03/22 2329 280 lb (127 kg)     Height 07/03/22 2329 5\' 11"  (1.803 m)     Head Circumference --      Peak Flow --      Pain Score 07/03/22 2328 8     Pain Loc --      Pain Edu? --      Excl. in GC? --     Most recent vital signs: Vitals:   07/03/22 2329  BP: (!) 163/96  Pulse: 86  Resp: 20  Temp: 98.3 F (36.8 C)  SpO2: 96%    General: Awake, no distress.  CV:  Good peripheral perfusion.  Resp:  Normal effort.  Abd:  No distention.  Other:  No swelling erythema or warmth to the affected left thigh area and the rest of the limb is neurovascular intact and he is standing and ambulating well in the room.   ED Results / Procedures / Treatments   Labs (all labs ordered are listed, but only abnormal results are  displayed) Labs Reviewed  BASIC METABOLIC PANEL - Abnormal; Notable for the following components:      Result Value   Creatinine, Ser 1.42 (*)    GFR, Estimated 54 (*)    All other components within normal limits  CBC WITH DIFFERENTIAL/PLATELET - Abnormal; Notable for the following components:   Hemoglobin 12.7 (*)    RDW 16.6 (*)    Lymphs Abs 4.1 (*)    All other components within normal limits  CK     I ordered and reviewed the above labs they are notable for nl CK    RADIOLOGY I independently reviewed and interpreted x-ray of the hip and see no obvious fracture dislocations   PROCEDURES:  Critical Care performed: No  Procedures   MEDICATIONS ORDERED IN ED: Medications  acetaminophen (TYLENOL) tablet 1,000 mg (1,000 mg Oral Given 07/04/22 0413)  gadobutrol (GADAVIST) 1 MMOL/ML injection 10 mL (10 mLs Intravenous Contrast Given 07/04/22 0316)  fentaNYL (SUBLIMAZE) injection 50 mcg (50 mcg Intravenous Given 07/04/22 0338)    Consultant-I spoke with radiologist on-call to discuss the imaging  MRI of the left thigh and 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  IMPRESSION / MDM / ASSESSMENT AND PLAN / ED COURSE  I reviewed the triage vital signs and the nursing notes.                                Patient's presentation is most consistent with acute presentation with potential threat to life or bodily function.  Differential diagnosis includes, but is not limited to, DVT, myositis, bony malignancy, fracture dislocation   The patient is on the cardiac monitor to evaluate for evidence of arrhythmia and/or significant heart rate changes.  MDM: This is a patient with 4 days of left thigh anterior pain with no overlying skin changes to suggest infection so today I will obtain an x-ray to rule out occult fracture, bony malignancy, DVT study, and a CK to check for myositis potentially related to statin use.   CK is negative.  Patient stable.  X-rays  negative.  Pending the results of the DVT ultrasound scan, he will be discharged plus or minus therapies as needed.  --- Ultrasound showed a soft tissue vascular lesion, I will order an MRI to better characterize this lesion and if concerning for malignancy I will have him discharge and referred out to oncology for follow-up.     FINAL CLINICAL IMPRESSION(S) / ED DIAGNOSES   Final diagnoses:  Left thigh pain  Mass of left thigh     Rx / DC Orders   ED Discharge Orders          Ordered    Ambulatory referral to Hematology / Oncology        07/04/22 0542             Note:  This document was prepared using Dragon voice recognition software and may include unintentional dictation errors.    Pilar Jarvis, MD 07/04/22 [id number]    Pilar Jarvis, MD 07/04/22 [phone]

## 2022-07-04 NOTE — Discharge Instructions (Addendum)
Take tylenol 650mg  every 6 hours for pain.   I made a referral to oncology who are specialists for muscular growths and further evaluation and management as needed.  They will reach out to you to schedule an appointment.

## 2022-07-07 ENCOUNTER — Inpatient Hospital Stay: Payer: Self-pay | Attending: Internal Medicine | Admitting: Internal Medicine

## 2022-07-07 ENCOUNTER — Inpatient Hospital Stay: Payer: Self-pay

## 2022-07-07 ENCOUNTER — Encounter: Payer: Self-pay | Admitting: Internal Medicine

## 2022-07-07 VITALS — BP 190/106 | HR 100 | Temp 97.6°F | Ht 71.0 in | Wt 285.2 lb

## 2022-07-07 DIAGNOSIS — Z79899 Other long term (current) drug therapy: Secondary | ICD-10-CM | POA: Insufficient documentation

## 2022-07-07 DIAGNOSIS — R2242 Localized swelling, mass and lump, left lower limb: Secondary | ICD-10-CM | POA: Insufficient documentation

## 2022-07-07 DIAGNOSIS — R059 Cough, unspecified: Secondary | ICD-10-CM | POA: Insufficient documentation

## 2022-07-07 DIAGNOSIS — N400 Enlarged prostate without lower urinary tract symptoms: Secondary | ICD-10-CM | POA: Insufficient documentation

## 2022-07-07 DIAGNOSIS — Z7985 Long-term (current) use of injectable non-insulin antidiabetic drugs: Secondary | ICD-10-CM | POA: Insufficient documentation

## 2022-07-07 DIAGNOSIS — M79652 Pain in left thigh: Secondary | ICD-10-CM | POA: Insufficient documentation

## 2022-07-07 DIAGNOSIS — I129 Hypertensive chronic kidney disease with stage 1 through stage 4 chronic kidney disease, or unspecified chronic kidney disease: Secondary | ICD-10-CM | POA: Insufficient documentation

## 2022-07-07 DIAGNOSIS — N183 Chronic kidney disease, stage 3 unspecified: Secondary | ICD-10-CM | POA: Insufficient documentation

## 2022-07-07 DIAGNOSIS — E1122 Type 2 diabetes mellitus with diabetic chronic kidney disease: Secondary | ICD-10-CM | POA: Insufficient documentation

## 2022-07-07 DIAGNOSIS — Z7984 Long term (current) use of oral hypoglycemic drugs: Secondary | ICD-10-CM | POA: Insufficient documentation

## 2022-07-07 DIAGNOSIS — F17210 Nicotine dependence, cigarettes, uncomplicated: Secondary | ICD-10-CM | POA: Insufficient documentation

## 2022-07-07 DIAGNOSIS — M5442 Lumbago with sciatica, left side: Secondary | ICD-10-CM

## 2022-07-07 DIAGNOSIS — G8929 Other chronic pain: Secondary | ICD-10-CM | POA: Insufficient documentation

## 2022-07-07 MED ORDER — TRAMADOL HCL 50 MG PO TABS: 50.0000 mg | ORAL_TABLET | Freq: Three times a day (TID) | ORAL | 0 refills | Status: DC | PRN

## 2022-07-07 NOTE — Progress Notes (Signed)
[facility] CONSULT NOTE  Patient Care Team: Center, [facility] as PCP - General (General Practice)  CHIEF COMPLAINTS/PURPOSE OF CONSULTATION: Leg mass  Oncology History   No history exists.    IMPRESSION: 1. No evidence of DVT within the LEFT lower extremity. 2. A vascular soft tissue mass within the upper left thigh. While this may represent a large hematoma, MRI correlation is recommended.     Electronically Signed   By: Aram Candela M.D.   On: 07/04/2022 02:44 ------------------------------   There is a T1 hyperintense lesion in the left rectus femoris measuring 2.7 cm transverse by 1.5 cm AP by 5.4 cm craniocaudal. The lesion demonstrates complete signal dropout on fat saturation images and does not enhance.   Soft tissues   Negative.   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 The exam is otherwise negative.     Electronically Signed   By: Drusilla Kanner M.D.   On: 07/04/2022 08:19  HISTORY OF PRESENTING ILLNESS: Patient ambulating-independently; but limping with pain.  He is alone.  Mitchell Robbins 67 y.o.  male with Hx of smoking referred to Oncology for thigh mass/ and pain in left leg.   Patient admits to have a history of chronic back pain more than 10 years ago.  Evaluated by chiropractor/orthopedics.  Recommended steroid injections.  Declined at the time.   However patient noted to have worsening pain in left thigh in last 2-3 weeks.   Patient had extensive workup in the emergency room with ultrasound Dopplers negative for blood clots.  However showed approximately 5 cm lesion which led to femur MRI-imaging findings suggestive of lipoma.  Patient denies any knee pain.   Review of Systems  Constitutional:  Positive for malaise/fatigue. Negative for chills, diaphoresis, fever and weight loss.  HENT:  Negative for nosebleeds and sore throat.   Eyes:  Negative for double vision.   Respiratory:  Positive for cough and shortness of breath. Negative for hemoptysis, sputum production and wheezing.   Cardiovascular:  Negative for chest pain, palpitations, orthopnea and leg swelling.  Gastrointestinal:  Negative for abdominal pain, blood in stool, constipation, diarrhea, heartburn, melena, nausea and vomiting.  Genitourinary:  Negative for dysuria, frequency and urgency.  Musculoskeletal:  Positive for back pain, joint pain and myalgias.  Skin: Negative.  Negative for itching and rash.  Neurological:  Negative for dizziness, tingling, focal weakness, weakness and headaches.  Endo/Heme/Allergies:  Does not bruise/bleed easily.  Psychiatric/Behavioral:  Negative for depression. The patient is not nervous/anxious and does not have insomnia.     MEDICAL HISTORY:  Past Medical History:  Diagnosis Date   Diabetes mellitus without complication (HCC)    Hypertension     SURGICAL HISTORY: Past Surgical History:  Procedure Laterality Date   COLONOSCOPY WITH PROPOFOL N/A 01/21/2018   Procedure: COLONOSCOPY WITH BIOPSIES;  Surgeon: Midge Minium, MD;  Location: [facility];  Service: Endoscopy;  Laterality: N/A;   COLONOSCOPY WITH PROPOFOL N/A 01/11/2019   Procedure: COLONOSCOPY WITH PROPOFOL;  Surgeon: Midge Minium, MD;  Location: [facility] END[facility]COPY;  Service: Endoscopy;  Laterality: N/A;   COLONOSCOPY WITH PROPOFOL N/A 06/17/2022   Procedure: COLONOSCOPY WITH PROPOFOL;  Surgeon: Midge Minium, MD;  Location: [facility] ENDOSCOPY;  Service: Endoscopy;  Laterality: N/A;   ESOPHAGOGASTRODUODENOSCOPY (EGD) WITH PROPOFOL N/A 12/24/2017   Procedure: ESOPHAGOGASTRODUODENOSCOPY (EGD) WITH PROPOFOL;  Surgeon: Midge Minium, MD;  Location: [facility] ENDOSCOPY;  Service: Endoscopy;  Laterality: N/A;   ESOPHAGOGASTRODUODENOSCOPY (EGD) WITH  PROPOFOL N/A 01/21/2018   Procedure: ESOPHAGOGASTRODUODENOSCOPY (EGD) WITH BIOPSIES;  Surgeon: Midge Minium, MD;  Location: [facility];  Service:  Endoscopy;  Laterality: N/A;   POLYPECTOMY N/A 01/21/2018   Procedure: POLYPECTOMY;  Surgeon: Midge Minium, MD;  Location: [facility];  Service: Endoscopy;  Laterality: N/A;   THYROIDECTOMY      SOCIAL HISTORY: Social History   Socioeconomic History   Marital status: Single    Spouse name: Not on file   Number of children: Not on file   Years of education: Not on file   Highest education level: Not on file  Occupational History   Occupation: truck driver  Tobacco Use   Smoking status: Every Day    Packs/day: 1.00    Years: 40.00    Additional pack years: 0.00    Total pack years: 40.00    Types: Cigarettes    Passive exposure: Current   Smokeless tobacco: Never  Vaping Use   Vaping Use: Never used  Substance and Sexual Activity   Alcohol use: No    Comment: quit drinking alcohol long time ago   Drug use: No   Sexual activity: Not on file  Other Topics Concern   Not on file  Social History Narrative   Not on file   Social Determinants of Health   Financial Resource Strain: Not on file  Food Insecurity: No Food Insecurity (07/07/2022)   Hunger Vital Sign    Worried About Running Out of Food in the Last Year: Never true    Ran Out of Food in the Last Year: Never true  Transportation Needs: No Transportation Needs (07/07/2022)   PRAPARE - Administrator, Civil Service (Medical): No    Lack of Transportation (Non-Medical): No  Physical Activity: Not on file  Stress: Not on file  Social Connections: Not on file  Intimate Partner Violence: Not At Risk (07/07/2022)   Humiliation, Afraid, Rape, and Kick questionnaire    Fear of Current or Ex-Partner: No    Emotionally Abused: No    Physically Abused: No    Sexually Abused: No    FAMILY HISTORY: Family History  Problem Relation Age of Onset   Diabetes Mother    Hypertension Mother    Diabetes Brother     ALLERGIES:  has No Known Allergies.  MEDICATIONS:  Current Outpatient Medications   Medication Sig Dispense Refill   atorvastatin (LIPITOR) 40 MG tablet Take 40 mg by mouth daily.     cloNIDine (CATAPRES) 0.2 MG tablet Take 0.2 mg by mouth 2 (two) times daily.     furosemide (LASIX) 20 MG tablet Take by mouth.     glipiZIDE (GLUCOTROL XL) 10 MG 24 hr tablet Take 10 mg by mouth daily.     labetalol (NORMODYNE) 200 MG tablet Take 200 mg by mouth 2 (two) times daily.     losartan (COZAAR) 100 MG tablet Take by mouth.     meloxicam (MOBIC) 7.5 MG tablet Take 7.5 mg by mouth daily.     metFORMIN (GLUCOPHAGE) 1000 MG tablet Take by mouth.     traMADol (ULTRAM) 50 MG tablet Take 1 tablet (50 mg total) by mouth every 8 (eight) hours as needed. 30 tablet 0   isosorbide mononitrate (IMDUR) 60 MG 24 hr tablet Take 1 tablet by mouth daily.     liraglutide (VICTOZA) 18 MG/3ML SOPN Inject into the skin.     No current facility-administered medications for this visit.    PHYSICAL  EXAMINATION:  Vitals:   07/07/22 1357  BP: (!) 190/106  Pulse: 100  Temp: 97.6 F (36.4 C)  SpO2: 100%   Filed Weights   07/07/22 1357  Weight: 285 lb 3.2 oz (129.4 kg)    Physical Exam Vitals and nursing note reviewed.  HENT:     Head: Normocephalic and atraumatic.     Mouth/Throat:     Pharynx: Oropharynx is clear.  Eyes:     Extraocular Movements: Extraocular movements intact.     Pupils: Pupils are equal, round, and reactive to light.  Cardiovascular:     Rate and Rhythm: Normal rate and regular rhythm.  Pulmonary:     Comments: Decreased breath sounds bilaterally.  Abdominal:     Palpations: Abdomen is soft.  Musculoskeletal:        General: Normal range of motion.     Cervical back: Normal range of motion.  Skin:    General: Skin is warm.  Neurological:     General: No focal deficit present.     Mental Status: He is alert and oriented to person, place, and time.  Psychiatric:        Behavior: Behavior normal.        Judgment: Judgment normal.     LABORATORY DATA:  I  have reviewed the data as listed Lab Results  Component Value Date   WBC 9.4 07/04/2022   HGB 12.7 (L) 07/04/2022   HCT 40.8 07/04/2022   MCV 85.4 07/04/2022   PLT 284 07/04/2022   Recent Labs    07/04/22 0149  NA 140  K 3.6  CL 103  CO2 28  GLUCOSE 79  BUN 20  CREATININE 1.42*  CALCIUM 9.0  GFRNONAA 54*    RADIOGRAPHIC STUDIES: I have personally reviewed the radiological images as listed and agreed with the findings in the report. MR FEMUR LEFT W WO CONTRAST  Result Date: 07/04/2022 CLINICAL DATA:  Left thigh pain. Possible mass in the left thigh on left leg venous ultrasound. EXAM: MR OF THE LEFT LOWER EXTREMITY WITHOUT AND WITH CONTRAST TECHNIQUE: Multiplanar, multisequence MR imaging of the left thigh was performed both before and after administration of intravenous contrast. CONTRAST:  10 mL GADAVIST GADOBUTROL 1 MMOL/ML IV SOLN COMPARISON:  Left leg ultrasound 0 07/04/22. FINDINGS: Bones/Joint/Cartilage Marrow signal is normal without fracture, stress change or focal lesion. Imaged joints appear normal. Ligaments Intact and normal appearance. Muscles and Tendons There is a T1 hyperintense lesion in the left rectus femoris measuring 2.7 cm transverse by 1.5 cm AP by 5.4 cm craniocaudal. The lesion demonstrates complete signal dropout on fat saturation images and does not enhance. Soft tissues Negative. 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 The exam is otherwise negative. Electronically Signed   By: Drusilla Kanner M.D.   On: 07/04/2022 08:19   US Venous Img Lower Unilateral Left  Result Date: 07/04/2022 CLINICAL DATA:  Left thigh pain. EXAM: LEFT LOWER EXTREMITY VENOUS DOPPLER ULTRASOUND TECHNIQUE: Gray-scale sonography with compression, as well as color and duplex ultrasound, were performed to evaluate the deep venous system(s) from the level of the common femoral vein through the popliteal and proximal calf veins. COMPARISON:  None  Available. FINDINGS: VENOUS Normal compressibility of the common femoral, superficial femoral, and popliteal veins, as well as the visualized calf veins. Visualized portions of profunda femoral vein and great saphenous vein unremarkable. No filling defects to suggest DVT on grayscale or color Doppler imaging. Doppler waveforms show normal  direction of venous flow, normal respiratory plasticity and response to augmentation. Limited views of the contralateral common femoral vein are unremarkable. OTHER A 4.5 cm x 1.8 cm x 3.0 cm area of heterogeneous echogenicity is seen within the soft tissues of the upper left thigh. No flow is seen within this region on color Doppler evaluation. Limitations: none IMPRESSION: 1. No evidence of DVT within the LEFT lower extremity. 2. A vascular soft tissue mass within the upper left thigh. While this may represent a large hematoma, MRI correlation is recommended. Electronically Signed   By: Aram Candela M.D.   On: 07/04/2022 02:44   DG Hip Unilat W or Wo Pelvis 2-3 Views Left  Result Date: 07/04/2022 CLINICAL DATA:  Left thigh pain. EXAM: DG HIP (WITH OR WITHOUT PELVIS) 2-3V LEFT COMPARISON:  None Available. FINDINGS: There is no evidence of hip fracture or dislocation. Degenerative changes seen involving both hips in the form of joint space narrowing and acetabular sclerosis. IMPRESSION: Degenerative changes involving both hips. Electronically Signed   By: Aram Candela M.D.   On: 07/04/2022 01:34   US RENAL ARTERY DUPLEX COMPLETE  Result Date: 06/09/2022 CLINICAL DATA:  Benign essential hypertension EXAM: RENAL/URINARY TRACT ULTRASOUND RENAL DUPLEX DOPPLER ULTRASOUND COMPARISON:  CT abdomen pelvis 04/29/2022 Renal artery Doppler 06/27/2021 FINDINGS: Right Kidney: Length: 14.0 cm. Echogenicity within normal limits. No mass or hydronephrosis visualized. 12 mm nonobstructing calculus seen in the lower pole of the right kidney. 2.6 cm simple cyst in the upper pole of  the right kidney does not require further imaging follow-up. Left Kidney: Length: 13.2 cm. Echogenicity within normal limits. No mass or hydronephrosis visualized. Multiple simple cysts present in the upper pole of the left kidney measuring up to 3.2 cm do not require further imaging follow-up. Bladder: No significant bladder wall thickening. Enlarged nodular prostate seen impressing upon the bladder neck. RENAL DUPLEX ULTRASOUND Right Renal Artery Velocities: Origin:  132 cm/sec Mid:  139 cm/sec Hilum:  88 cm/sec Interlobar:  30 cm/sec Arcuate:  22 cm/sec Left Renal Artery Velocities: Origin:  80 cm/sec Mid:  92 cm/sec Hilum:  55 cm/sec Interlobar:  41 cm/sec Arcuate:  24 cm/sec Aortic Velocity:  43 cm/sec Right Renal-Aortic Ratios: Origin: 3.1 Mid:  3.2 Hilum: 2.1 Interlobar: 0.7 Arcuate: 0.5 Left Renal-Aortic Ratios: Origin: 1.9 Mid: 2.2 Hilum: 1.3 Interlobar: 1.0 Arcuate: 0.5 IMPRESSION: 1. No Doppler evidence of significant renal artery stenosis. 2. 12 mm nonobstructing right renal calculus.  No hydronephrosis. 3. Markedly enlarged prostate impressing upon the bladder neck. Please correlate with PSA values for possibility of prostate malignancy. Electronically Signed   By: Acquanetta Belling M.D.   On: 06/09/2022 11:15     Mass of left thigh # Left thigh pain-unclear etiology.  April 2024 left thigh MRI-approximately 5 cm "lipoma" based on imaging characteristics.  Ultrasound lower extremity negative for blood clot.  # I discussed with the patient regarding the unclear etiology of the ongoing leg pain.  In general lipoma should not be because of significant pain that too so significantly worse in the last few weeks.  I would review the imaging at the tumor conference.  And also recommend MRI of the lumbar spine for further evaluation-   # History of smoking ongoing cough-recommended chest x-ray.  # Left thigh pain-  5/10; on tylenol prn [history of peptic ulcer disease-no NSAIDs]-recommend tramadol every 8  hours as needed.  Prescription sent.  # Active smoker: Again counseled the patient to quit smoking.  # DM/  CKD-III-recommend compliance with diabetes medications.  And also avoid NSAIDs continued hydration.  # HTN-poorly controlled blood pressures.  Recommend compliance with his blood pressure medications.  Thank you  for allowing me to participate in the care of your pleasant patient. Please do not hesitate to contact me with questions or concerns in the interim.   # DISPOSITION: # no labs today #  CXR today # MRI Lumbar spine asap-  # Follow up in 2 weeks- MD: no labs- Dr.B  Above plan of care was discussed with patient in detail.  My contact information was given to the patient/family.     Earna Coder, MD 07/07/2022 3:33 PM

## 2022-07-07 NOTE — Progress Notes (Signed)
C/o left leg pain

## 2022-07-07 NOTE — Assessment & Plan Note (Addendum)
#   Left thigh pain-unclear etiology.  April 2024 left thigh contrast MRI-approximately 5 cm "lipoma" based on imaging characteristics.  Ultrasound lower extremity negative for blood clot.  # I discussed with the patient regarding the unclear etiology of the ongoing leg pain.  In general lipoma should not be because of significant pain that too so significantly worse in the last few weeks.  I would review the imaging at the tumor conference.  And also recommend MRI of the lumbar spine for further evaluation-   # History of smoking ongoing cough-recommended chest x-ray.  # Left thigh pain-  5/10; on tylenol prn [history of peptic ulcer disease-no NSAIDs]-recommend tramadol every 8 hours as needed.  Prescription sent.  # Active smoker: Again counseled the patient to quit smoking.  # DM/ CKD-III-recommend compliance with diabetes medications.  And also avoid NSAIDs continued hydration.  # HTN-poorly controlled blood pressures.  Recommend compliance with his blood pressure medications.  Thank you  for allowing me to participate in the care of your pleasant patient. Please do not hesitate to contact me with questions or concerns in the interim.   # DISPOSITION: # no labs today #  CXR today # MRI Lumbar spine asap-  # Follow up in 2 weeks- MD: no labs- Dr.B

## 2022-07-08 ENCOUNTER — Ambulatory Visit
Admission: RE | Admit: 2022-07-08 | Discharge: 2022-07-08 | Disposition: A | Discharge status: Home / Self Care | Payer: Self-pay | Source: Ambulatory Visit | Attending: Internal Medicine | Admitting: Internal Medicine

## 2022-07-08 DIAGNOSIS — R2242 Localized swelling, mass and lump, left lower limb: Secondary | ICD-10-CM | POA: Insufficient documentation

## 2022-07-08 DIAGNOSIS — M5442 Lumbago with sciatica, left side: Secondary | ICD-10-CM | POA: Insufficient documentation

## 2022-07-08 MED ORDER — GADOBUTROL 1 MMOL/ML IV SOLN
10.0000 mL | Freq: Once | INTRAVENOUS | Status: AC | PRN
  Administered 2022-07-08: 10 mL via INTRAVENOUS

## 2022-07-10 ENCOUNTER — Other Ambulatory Visit: Payer: Self-pay

## 2022-07-21 ENCOUNTER — Inpatient Hospital Stay: Payer: Self-pay | Attending: Internal Medicine | Admitting: Internal Medicine

## 2022-07-21 ENCOUNTER — Encounter: Payer: Self-pay | Admitting: Internal Medicine

## 2022-07-21 DIAGNOSIS — M79652 Pain in left thigh: Secondary | ICD-10-CM | POA: Insufficient documentation

## 2022-07-21 DIAGNOSIS — R2242 Localized swelling, mass and lump, left lower limb: Secondary | ICD-10-CM | POA: Insufficient documentation

## 2022-07-21 DIAGNOSIS — F17210 Nicotine dependence, cigarettes, uncomplicated: Secondary | ICD-10-CM | POA: Insufficient documentation

## 2022-07-21 DIAGNOSIS — E119 Type 2 diabetes mellitus without complications: Secondary | ICD-10-CM | POA: Insufficient documentation

## 2022-07-21 DIAGNOSIS — Z791 Long term (current) use of non-steroidal anti-inflammatories (NSAID): Secondary | ICD-10-CM | POA: Insufficient documentation

## 2022-07-21 DIAGNOSIS — G4733 Obstructive sleep apnea (adult) (pediatric): Secondary | ICD-10-CM | POA: Insufficient documentation

## 2022-07-21 DIAGNOSIS — Z8711 Personal history of peptic ulcer disease: Secondary | ICD-10-CM | POA: Insufficient documentation

## 2022-07-21 DIAGNOSIS — Z79899 Other long term (current) drug therapy: Secondary | ICD-10-CM | POA: Insufficient documentation

## 2022-07-21 DIAGNOSIS — G8929 Other chronic pain: Secondary | ICD-10-CM | POA: Insufficient documentation

## 2022-07-21 DIAGNOSIS — M5116 Intervertebral disc disorders with radiculopathy, lumbar region: Secondary | ICD-10-CM | POA: Insufficient documentation

## 2022-07-21 DIAGNOSIS — Z7984 Long term (current) use of oral hypoglycemic drugs: Secondary | ICD-10-CM | POA: Insufficient documentation

## 2022-07-21 DIAGNOSIS — R269 Unspecified abnormalities of gait and mobility: Secondary | ICD-10-CM | POA: Insufficient documentation

## 2022-07-21 DIAGNOSIS — I10 Essential (primary) hypertension: Secondary | ICD-10-CM | POA: Insufficient documentation

## 2022-07-21 NOTE — Assessment & Plan Note (Addendum)
#   Left thigh pain-unclear etiology.  ER- April 2024 left thigh contrast MRI-approximately 5 cm "lipoma" based on imaging characteristics.  Ultrasound lower extremity negative for blood clot.  Lumbar spine MRI-with and without contrast showed degenerative arthritis otherwise no obvious evidence of any malignancy to explain his ongoing left thigh pain.  Reviewed at the tumor conference.  Would not recommend further imaging at this time.  # Left thigh pain-  5/10; on tylenol prn [history of peptic ulcer disease-no NSAIDs]-unlikely related to lipoma in the thigh.  Recommend further evaluation with orthopedics/chiropractor for further recommendations.  No further narcotics/tramadol medication refill in the clinic.  # Active smoker: Again counseled the patient to quit smoking.  # DISPOSITION: # Follow up as needed-- Dr.B

## 2022-07-21 NOTE — Progress Notes (Signed)
[facility] CONSULT NOTE  Patient Care Team: Center, [facility] as PCP - General (General Practice)  CHIEF COMPLAINTS/PURPOSE OF CONSULTATION: Leg mass  Oncology History   No history exists.    IMPRESSION: 1. No evidence of DVT within the LEFT lower extremity. 2. A vascular soft tissue mass within the upper left thigh. While this may represent a large hematoma, MRI correlation is recommended.     Electronically Signed   By: Aram Candela M.D.   On: 07/04/2022 02:44 ------------------------------   There is a T1 hyperintense lesion in the left rectus femoris measuring 2.7 cm transverse by 1.5 cm AP by 5.4 cm craniocaudal. The lesion demonstrates complete signal dropout on fat saturation images and does not enhance.   Soft tissues   Negative.   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 The exam is otherwise negative.     Electronically Signed   By: Drusilla Kanner M.D.   On: 07/04/2022 08:19  HISTORY OF PRESENTING ILLNESS: Patient ambulating-independently; but limping with pain.  He is alone.  Tavone Lasanta 67 y.o.  male with Hx of smoking and "left thigh mass"/pain is here to review the results of this blood work/MRI.  Pt still having pain in left thigh after he walks on it for awhile. No change in appetite   Patient admits to have a history of chronic back pain more than 10 years ago.  Evaluated by chiropractor/orthopedics in the past. None recently.    Review of Systems  Constitutional:  Positive for malaise/fatigue. Negative for chills, diaphoresis, fever and weight loss.  HENT:  Negative for nosebleeds and sore throat.   Eyes:  Negative for double vision.  Respiratory:  Positive for cough and shortness of breath. Negative for hemoptysis, sputum production and wheezing.   Cardiovascular:  Negative for chest pain, palpitations, orthopnea and leg swelling.  Gastrointestinal:  Negative for  abdominal pain, blood in stool, constipation, diarrhea, heartburn, melena, nausea and vomiting.  Genitourinary:  Negative for dysuria, frequency and urgency.  Musculoskeletal:  Positive for back pain, joint pain and myalgias.  Skin: Negative.  Negative for itching and rash.  Neurological:  Negative for dizziness, tingling, focal weakness, weakness and headaches.  Endo/Heme/Allergies:  Does not bruise/bleed easily.  Psychiatric/Behavioral:  Negative for depression. The patient is not nervous/anxious and does not have insomnia.     MEDICAL HISTORY:  Past Medical History:  Diagnosis Date   Diabetes mellitus without complication (HCC)    Hypertension     SURGICAL HISTORY: Past Surgical History:  Procedure Laterality Date   COLONOSCOPY WITH PROPOFOL N/A 01/21/2018   Procedure: COLONOSCOPY WITH BIOPSIES;  Surgeon: Midge Minium, MD;  Location: [facility];  Service: Endoscopy;  Laterality: N/A;   COLONOSCOPY WITH PROPOFOL N/A 01/11/2019   Procedure: COLONOSCOPY WITH PROPOFOL;  Surgeon: Midge Minium, MD;  Location: [facility] ENDOSCOPY;  Service: Endoscopy;  Laterality: N/A;   COLONOSCOPY WITH PROPOFOL N/A 06/17/2022   Procedure: COLONOSCOPY WITH PROPOFOL;  Surgeon: Midge Minium, MD;  Location: [facility] ENDOSCOPY;  Service: Endoscopy;  Laterality: N/A;   ESOPHAGOGASTRODUODENOSCOPY (EGD) WITH PROPOFOL N/A 12/24/2017   Procedure: ESOPHAGOGASTRODUODENOSCOPY (EGD) WITH PROPOFOL;  Surgeon: Midge Minium, MD;  Location: [facility] ENDOSCOPY;  Service: Endoscopy;  Laterality: N/A;   ESOPHAGOGASTRODUODENOSCOPY (EGD) WITH PROPOFOL N/A 01/21/2018   Procedure: ESOPHAGOGASTRODUODENOSCOPY (EGD) WITH BIOPSIES;  Surgeon: Midge Minium, MD;  Location: [facility];  Service: Endoscopy;  Laterality: N/A;   POLYPECTOMY N/A 01/21/2018   Procedure: POLYPECTOMY;  Surgeon: Midge Minium, MD;  Location: [facility];  Service: Endoscopy;  Laterality: N/A;   THYROIDECTOMY      SOCIAL HISTORY: Social History    Socioeconomic History   Marital status: Single    Spouse name: Not on file   Number of children: Not on file   Years of education: Not on file   Highest education level: Not on file  Occupational History   Occupation: truck driver  Tobacco Use   Smoking status: Every Day    Packs/day: 1.00    Years: 40.00    Additional pack years: 0.00    Total pack years: 40.00    Types: Cigarettes    Passive exposure: Current   Smokeless tobacco: Never  Vaping Use   Vaping Use: Never used  Substance and Sexual Activity   Alcohol use: No    Comment: quit drinking alcohol long time ago   Drug use: No   Sexual activity: Not on file  Other Topics Concern   Not on file  Social History Narrative   Not on file   Social Determinants of Health   Financial Resource Strain: Not on file  Food Insecurity: No Food Insecurity (07/07/2022)   Hunger Vital Sign    Worried About Running Out of Food in the Last Year: Never true    Ran Out of Food in the Last Year: Never true  Transportation Needs: No Transportation Needs (07/07/2022)   PRAPARE - Administrator, Civil Service (Medical): No    Lack of Transportation (Non-Medical): No  Physical Activity: Not on file  Stress: Not on file  Social Connections: Not on file  Intimate Partner Violence: Not At Risk (07/07/2022)   Humiliation, Afraid, Rape, and Kick questionnaire    Fear of Current or Ex-Partner: No    Emotionally Abused: No    Physically Abused: No    Sexually Abused: No    FAMILY HISTORY: Family History  Problem Relation Age of Onset   Diabetes Mother    Hypertension Mother    Diabetes Brother     ALLERGIES:  has No Known Allergies.  MEDICATIONS:  Current Outpatient Medications  Medication Sig Dispense Refill   atorvastatin (LIPITOR) 40 MG tablet Take 40 mg by mouth daily.     cloNIDine (CATAPRES) 0.2 MG tablet Take 0.2 mg by mouth 2 (two) times daily.     furosemide (LASIX) 20 MG tablet Take by mouth.      glipiZIDE (GLUCOTROL XL) 10 MG 24 hr tablet Take 10 mg by mouth daily.     hydrALAZINE (APRESOLINE) 100 MG tablet Take 100 mg by mouth 3 (three) times daily.     isosorbide mononitrate (IMDUR) 60 MG 24 hr tablet Take 1 tablet by mouth daily.     labetalol (NORMODYNE) 200 MG tablet Take 200 mg by mouth 2 (two) times daily.     losartan (COZAAR) 100 MG tablet Take by mouth.     meloxicam (MOBIC) 7.5 MG tablet Take 7.5 mg by mouth daily.     metFORMIN (GLUCOPHAGE) 1000 MG tablet Take by mouth.     traMADol (ULTRAM) 50 MG tablet Take 1 tablet (50 mg total) by mouth every 8 (eight) hours as needed. 30 tablet 0   No current facility-administered medications for this visit.    PHYSICAL EXAMINATION:  Vitals:   07/21/22 1522  BP: 118/67  Pulse: 90  Resp: 19  Temp: (!) 96.9 F (36.1 C)  SpO2: 97%   There were no  vitals filed for this visit.   Physical Exam Vitals and nursing note reviewed.  HENT:     Head: Normocephalic and atraumatic.     Mouth/Throat:     Pharynx: Oropharynx is clear.  Eyes:     Extraocular Movements: Extraocular movements intact.     Pupils: Pupils are equal, round, and reactive to light.  Cardiovascular:     Rate and Rhythm: Normal rate and regular rhythm.  Pulmonary:     Comments: Decreased breath sounds bilaterally.  Abdominal:     Palpations: Abdomen is soft.  Musculoskeletal:        General: Normal range of motion.     Cervical back: Normal range of motion.  Skin:    General: Skin is warm.  Neurological:     General: No focal deficit present.     Mental Status: He is alert and oriented to person, place, and time.  Psychiatric:        Behavior: Behavior normal.        Judgment: Judgment normal.     LABORATORY DATA:  I have reviewed the data as listed Lab Results  Component Value Date   WBC 9.4 07/04/2022   HGB 12.7 (L) 07/04/2022   HCT 40.8 07/04/2022   MCV 85.4 07/04/2022   PLT 284 07/04/2022   Recent Labs    07/04/22 0149  NA 140  K  3.6  CL 103  CO2 28  GLUCOSE 79  BUN 20  CREATININE 1.42*  CALCIUM 9.0  GFRNONAA 54*    RADIOGRAPHIC STUDIES: I have personally reviewed the radiological images as listed and agreed with the findings in the report. MR LUMBAR SPINE W WO CONTRAST  Result Date: 07/10/2022 CLINICAL DATA:  Back pain with spondyloarthropathy suspected EXAM: MRI LUMBAR SPINE WITHOUT AND WITH CONTRAST TECHNIQUE: Multiplanar and multiecho pulse sequences of the lumbar spine were obtained without and with intravenous contrast. CONTRAST:  10mL GADAVIST GADOBUTROL 1 MMOL/ML IV SOLN COMPARISON:  None Available. FINDINGS: Segmentation:  Standard. Alignment:  Physiologic. Vertebrae:  No fracture, evidence of discitis, or bone lesion. Conus medullaris and cauda equina: Conus extends to the L1 level. Conus and cauda equina appear normal. Paraspinal and other soft tissues: Negative for perispinal mass or inflammation. Bilateral renal cortical cysts, no follow-up imaging recommended. Disc levels: L2-L3: Left inferior foraminal protrusion which is noncompressive. L3-L4: Disc narrowing and endplate degeneration with circumferential bulging. Degenerative facet spurring on both sides. Moderate bilateral foraminal narrowing. Mild spinal stenosis. L4-L5: Disc narrowing and bulging with endplate and facet spurring. Upward migrating left paracentral extrusion moderately narrowing the left thecal sac and impinging on the descending L4 and L5 nerve root. Moderate right and advanced left foraminal narrowing. L5-S1:Degenerative facet spurring asymmetric to the left. The canal and foramina are patent. IMPRESSION: 1. Lumbar spine degeneration especially affecting the disc spaces and facets of L3-4 and L4-5. 2. 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 Asymmetric left foraminal impingement at this level. 3. L3-4 moderate biforaminal stenosis. Electronically Signed   By: Tiburcio Pea  M.D.   On: 07/10/2022 07:54   MR FEMUR LEFT W WO CONTRAST  Result Date: 07/04/2022 CLINICAL DATA:  Left thigh pain. Possible mass in the left thigh on left leg venous ultrasound. EXAM: MR OF THE LEFT LOWER EXTREMITY WITHOUT AND WITH CONTRAST TECHNIQUE: Multiplanar, multisequence MR imaging of the left thigh was performed both before and after administration of intravenous contrast. CONTRAST:  10 mL GADAVIST GADOBUTROL 1  MMOL/ML IV SOLN COMPARISON:  Left leg ultrasound 0 07/04/22. FINDINGS: Bones/Joint/Cartilage Marrow signal is normal without fracture, stress change or focal lesion. Imaged joints appear normal. Ligaments Intact and normal appearance. Muscles and Tendons There is a T1 hyperintense lesion in the left rectus femoris measuring 2.7 cm transverse by 1.5 cm AP by 5.4 cm craniocaudal. The lesion demonstrates complete signal dropout on fat saturation images and does not enhance. Soft tissues Negative. 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 The exam is otherwise negative. Electronically Signed   By: Drusilla Kanner M.D.   On: 07/04/2022 08:19   US Venous Img Lower Unilateral Left  Result Date: 07/04/2022 CLINICAL DATA:  Left thigh pain. EXAM: LEFT LOWER EXTREMITY VENOUS DOPPLER ULTRASOUND TECHNIQUE: Gray-scale sonography with compression, as well as color and duplex ultrasound, were performed to evaluate the deep venous system(s) from the level of the common femoral vein through the popliteal and proximal calf veins. COMPARISON:  None Available. FINDINGS: VENOUS Normal compressibility of the common femoral, superficial femoral, and popliteal veins, as well as the visualized calf veins. Visualized portions of profunda femoral vein and great saphenous vein unremarkable. No filling defects to suggest DVT on grayscale or color Doppler imaging. Doppler waveforms show normal direction of venous flow, normal respiratory plasticity and response to augmentation.  Limited views of the contralateral common femoral vein are unremarkable. OTHER A 4.5 cm x 1.8 cm x 3.0 cm area of heterogeneous echogenicity is seen within the soft tissues of the upper left thigh. No flow is seen within this region on color Doppler evaluation. Limitations: none IMPRESSION: 1. No evidence of DVT within the LEFT lower extremity. 2. A vascular soft tissue mass within the upper left thigh. While this may represent a large hematoma, MRI correlation is recommended. Electronically Signed   By: Aram Candela M.D.   On: 07/04/2022 02:44   DG Hip Unilat W or Wo Pelvis 2-3 Views Left  Result Date: 07/04/2022 CLINICAL DATA:  Left thigh pain. EXAM: DG HIP (WITH OR WITHOUT PELVIS) 2-3V LEFT COMPARISON:  None Available. FINDINGS: There is no evidence of hip fracture or dislocation. Degenerative changes seen involving both hips in the form of joint space narrowing and acetabular sclerosis. IMPRESSION: Degenerative changes involving both hips. Electronically Signed   By: Aram Candela M.D.   On: 07/04/2022 01:34     Mass of left thigh # Left thigh pain-unclear etiology.  ER- April 2024 left thigh contrast MRI-approximately 5 cm "lipoma" based on imaging characteristics.  Ultrasound lower extremity negative for blood clot.  Lumbar spine MRI-with and without contrast showed degenerative arthritis otherwise no obvious evidence of any malignancy to explain his ongoing left thigh pain.  Reviewed at the tumor conference.  Would not recommend further imaging at this time.  # Left thigh pain-  5/10; on tylenol prn [history of peptic ulcer disease-no NSAIDs]-unlikely related to lipoma in the thigh.  Recommend further evaluation with orthopedics/chiropractor for further recommendations.  No further narcotics/tramadol medication refill in the clinic.  # Active smoker: Again counseled the patient to quit smoking.  # DISPOSITION: # Follow up as needed-- Dr.B  Above plan of care was discussed with  patient in detail.  My contact information was given to the patient/family.     Earna Coder, MD 07/21/2022 3:50 PM

## 2022-07-21 NOTE — Progress Notes (Signed)
Pt still having pain in left thigh after he walks on it for awhile. No change in appetite.

## 2022-10-03 IMAGING — US US EXTREM  UP VENOUS*R*
1 series · 13 of 24 positions shown · non-contrast
Comparison: None.

CLINICAL DATA: Right hand swelling, edema



[Series 1: us venous img upper uni right (dvt) · portal-venous · 13 of 36 slices shown]
[im 1/36]
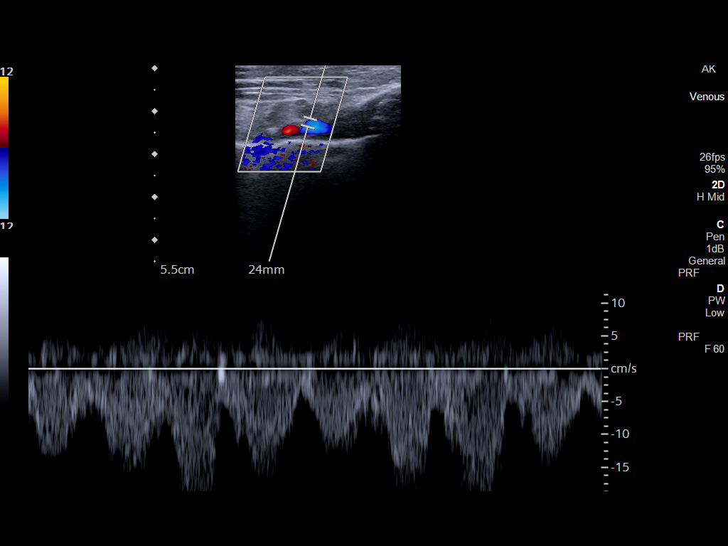
[im 4/36]
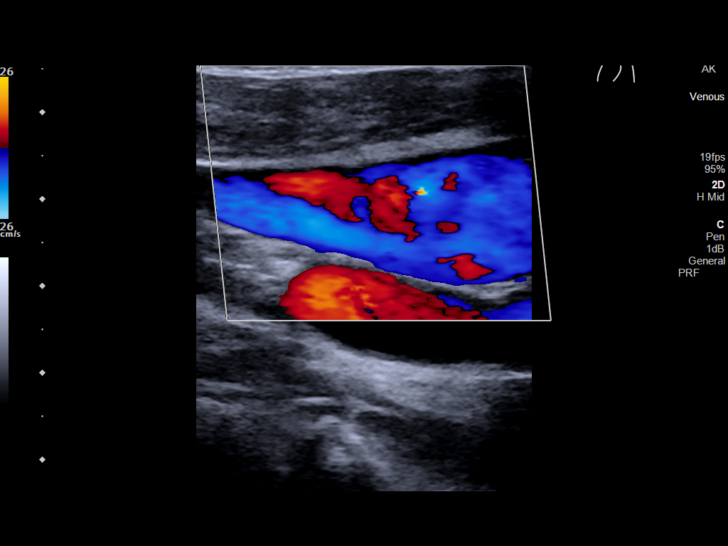
[im 7/36]
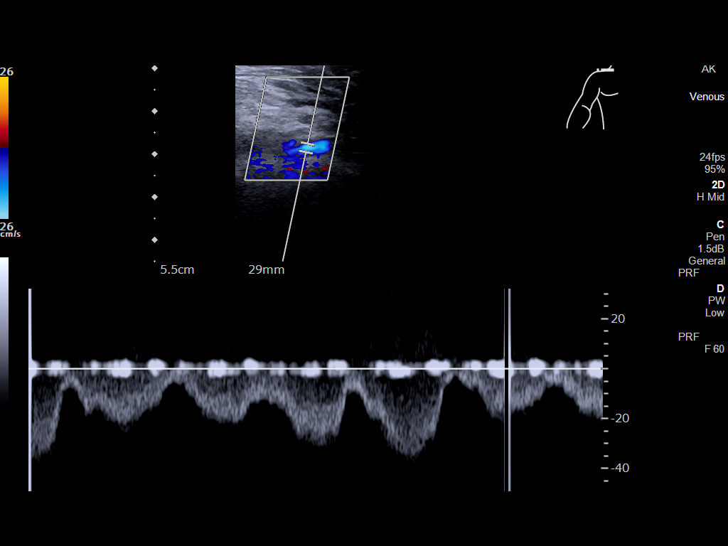
[im 10/36]
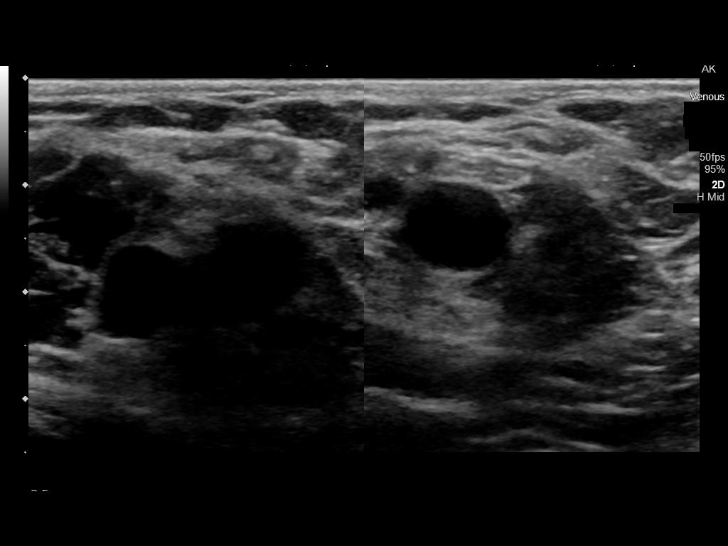
[im 13/36]
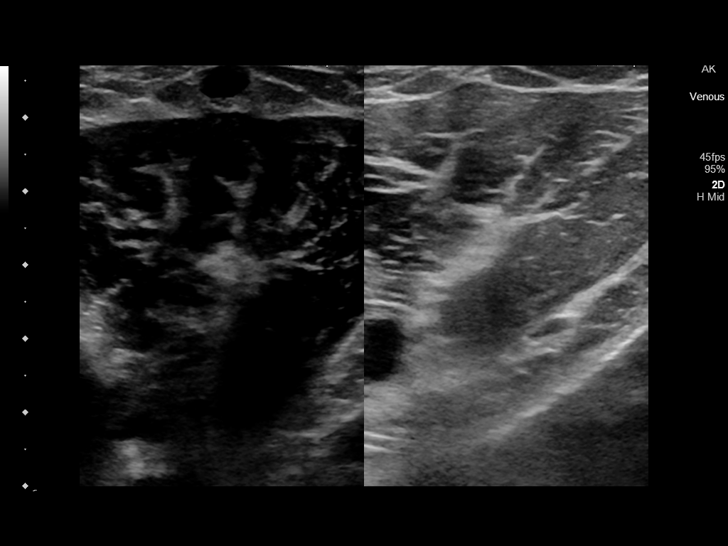
[im 16/36]
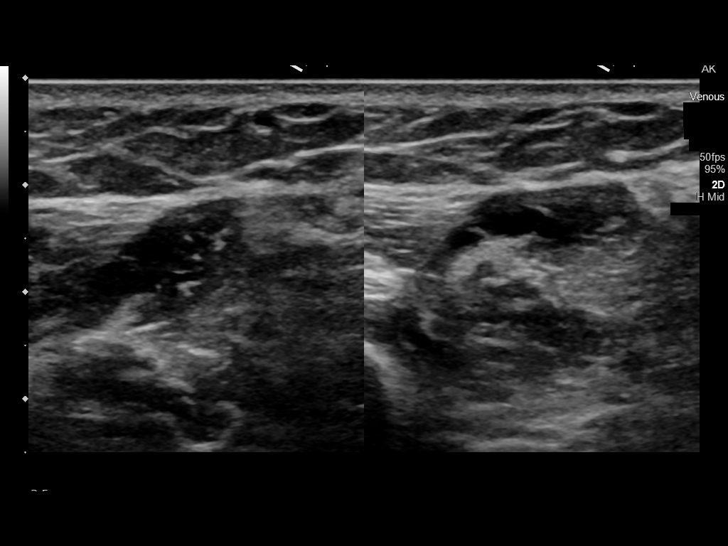
[im 19/36]
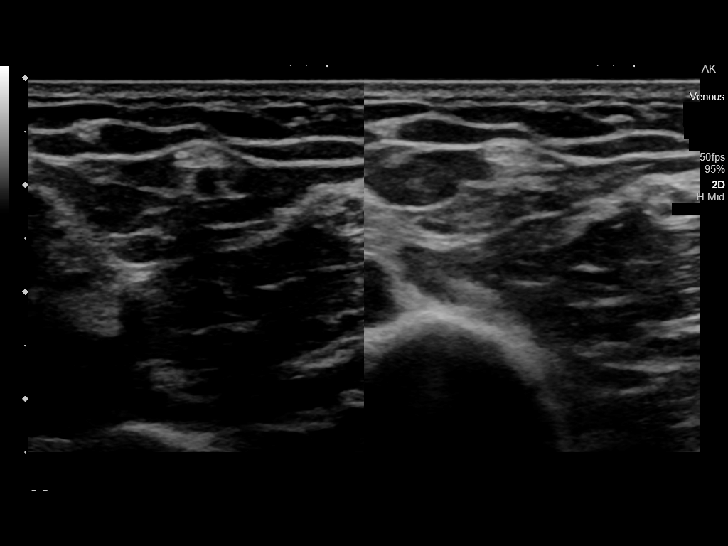
[im 20/36]
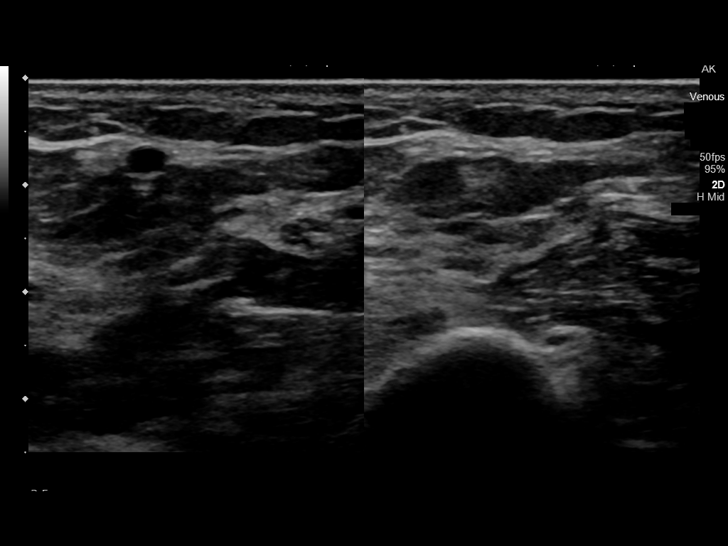
[im 23/36]
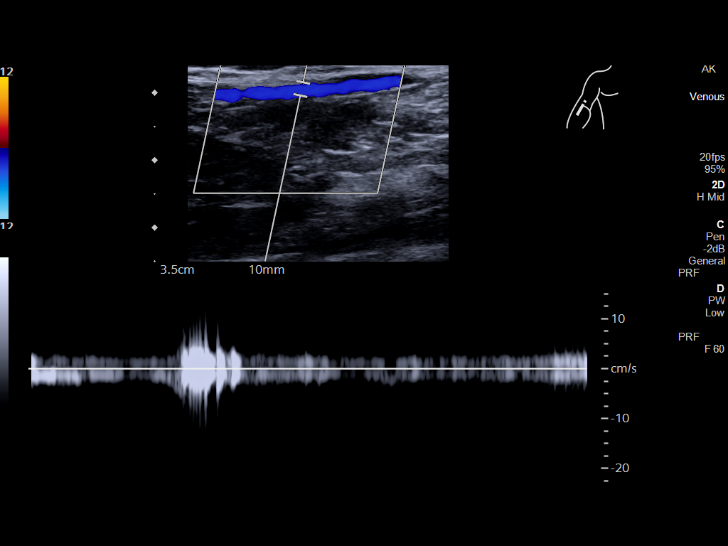
[im 26/36]
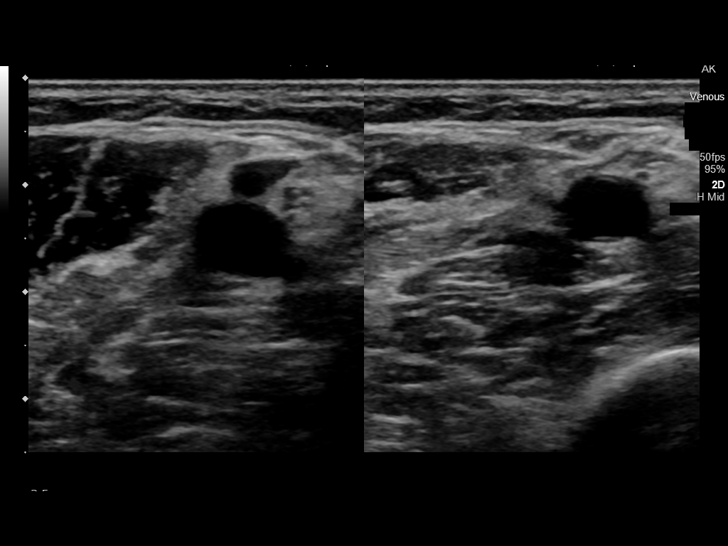
[im 29/36]
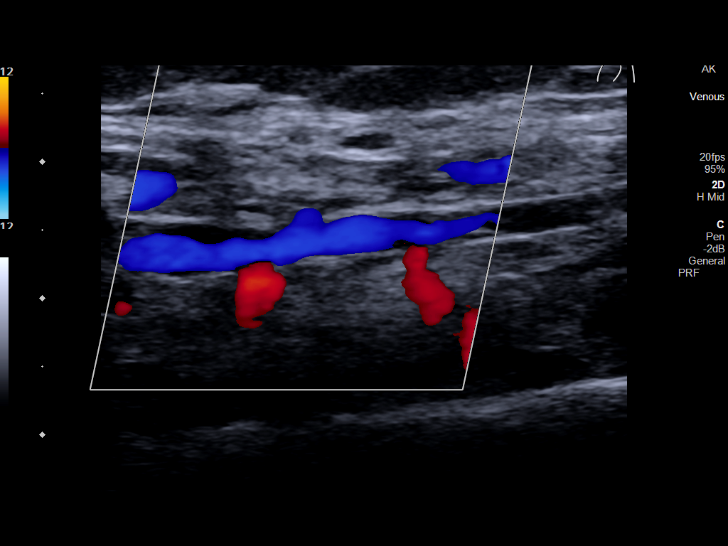
[im 32/36]
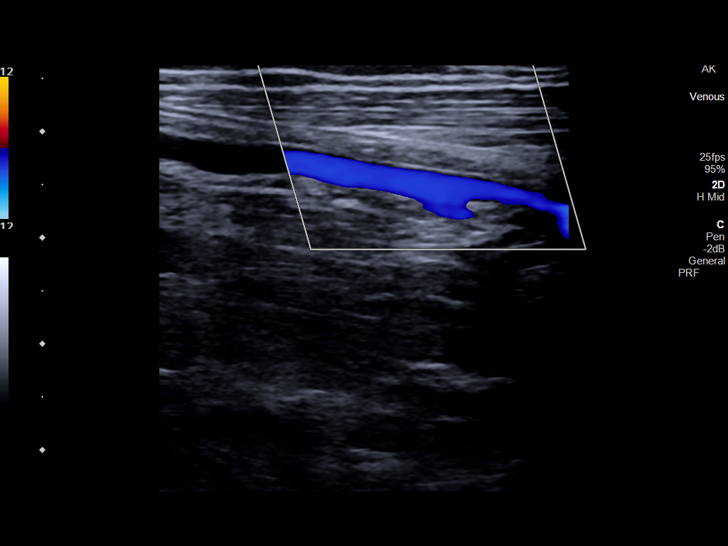
[im 36/36]
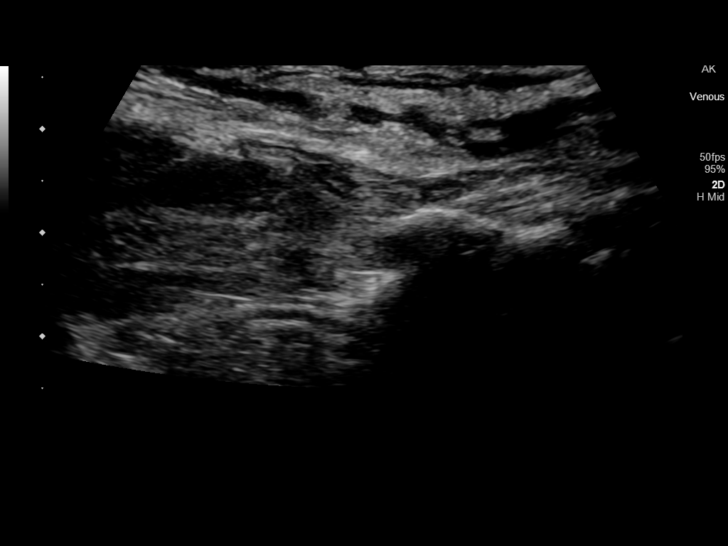

[13 of 24 positions shown; findings below may reference images not displayed]

FINDINGS: Contralateral Subclavian Vein: Respiratory phasicity is normal and
symmetric with the symptomatic side. No evidence of thrombus. Normal
compressibility.

Internal Jugular Vein: No evidence of thrombus. Normal
compressibility, respiratory phasicity and response to augmentation.

Subclavian Vein: No evidence of thrombus. Normal compressibility,
respiratory phasicity and response to augmentation.

Axillary Vein: No evidence of thrombus. Normal compressibility,
respiratory phasicity and response to augmentation.

Cephalic Vein: No evidence of thrombus. Normal compressibility,
respiratory phasicity and response to augmentation.

Basilic Vein: No evidence of thrombus. Normal compressibility,
respiratory phasicity and response to augmentation.

Brachial Veins: No evidence of thrombus. Normal compressibility,
respiratory phasicity and response to augmentation.

Radial Veins: No evidence of thrombus. Normal compressibility,
respiratory phasicity and response to augmentation.

Ulnar Veins: No evidence of thrombus. Normal compressibility,
respiratory phasicity and response to augmentation.

Venous Reflux:  None visualized.

Other Findings:  There is soft tissue edema in the right hand.
IMPRESSION: 1. No evidence of DVT within the right upper extremity.
2. Soft tissue edema in the hand.

## 2022-10-11 IMAGING — US US RENAL ARTERY STENOSIS
1 series · 13 of 25 positions shown · non-contrast
Comparison: No prior duplex, CT 06/05/2020

CLINICAL DATA: 65-year-old male with a history of hypertension

EXAM:
RENAL/URINARY TRACT ULTRASOUND
RENAL DUPLEX DOPPLER ULTRASOUND

[Series 1: us renal artery duplex complete · 13 of 60 slices shown]
[im 1/60]
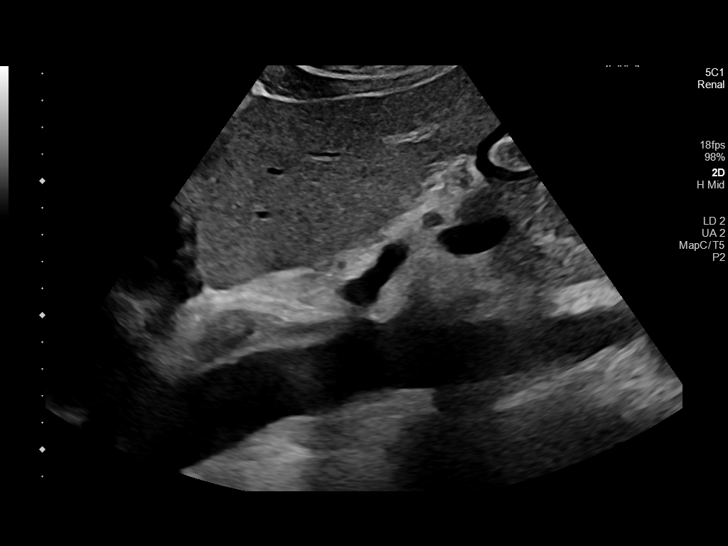
[im 5/60]
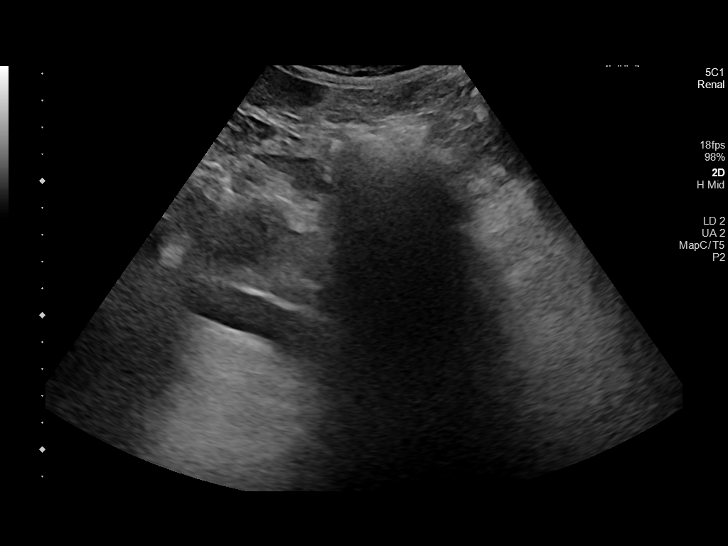
[im 10/60]
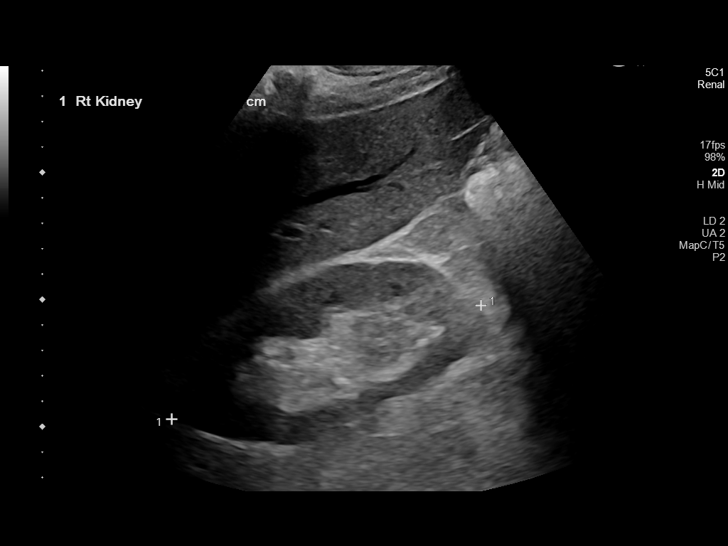
[im 15/60]
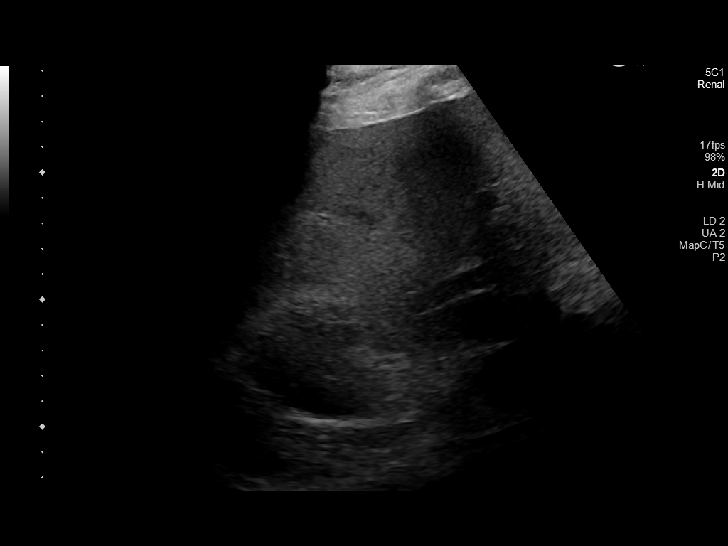
[im 20/60]
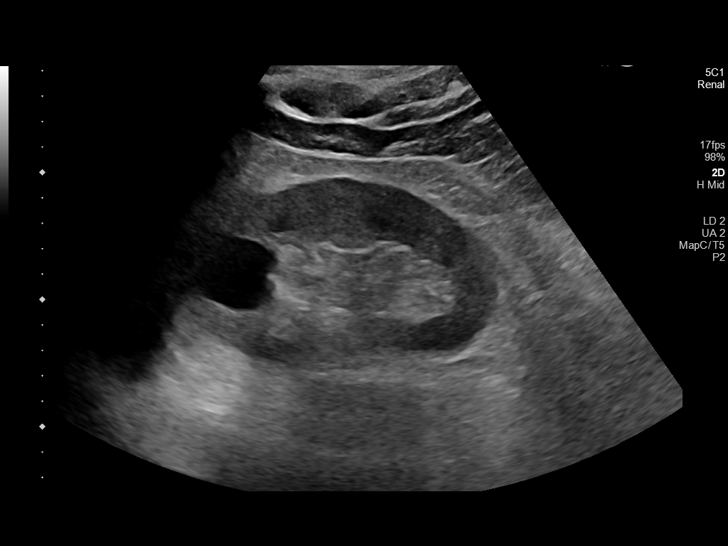
[im 25/60]
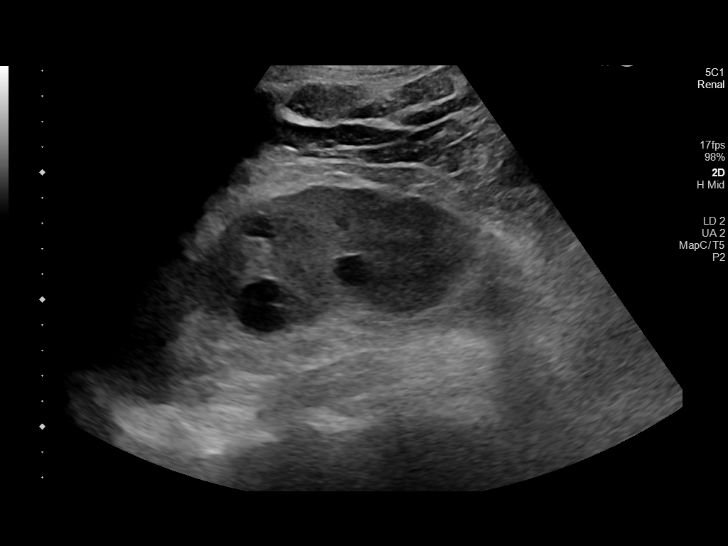
[im 30/60]
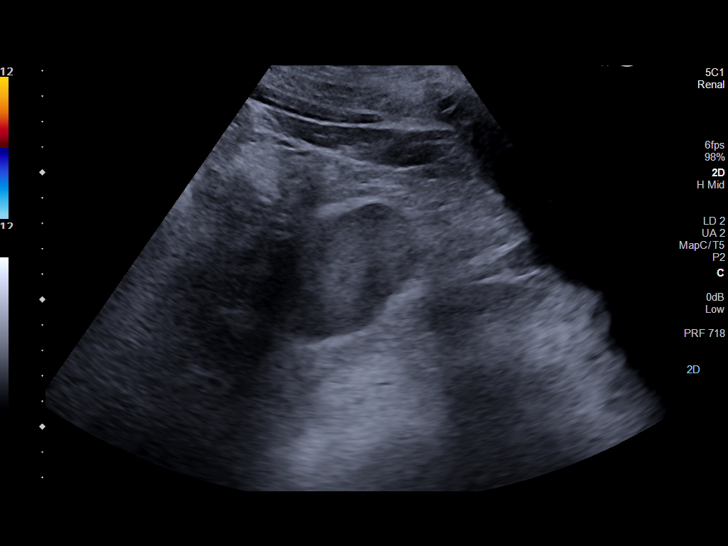
[im 35/60]
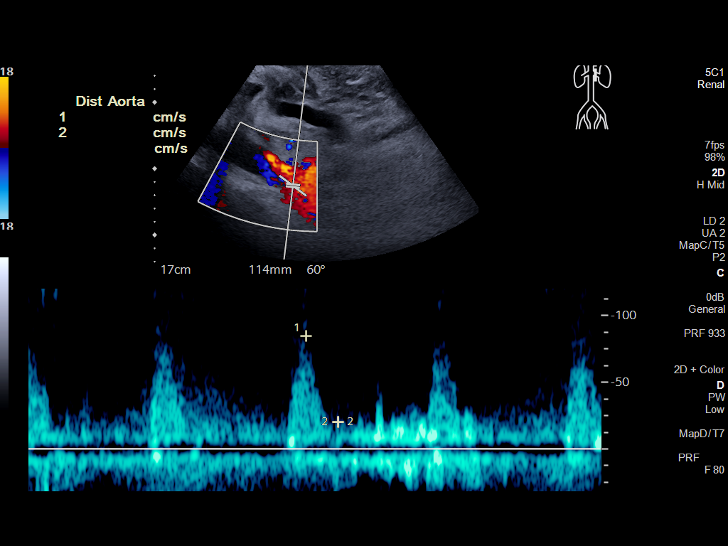
[im 40/60]
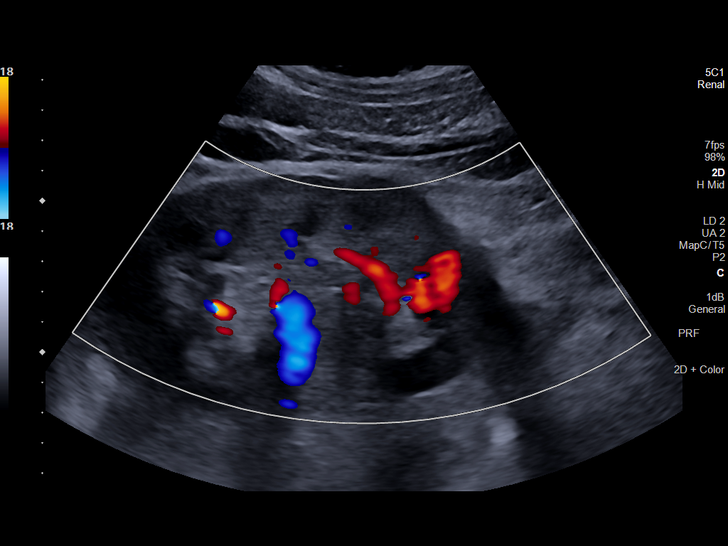
[im 45/60]
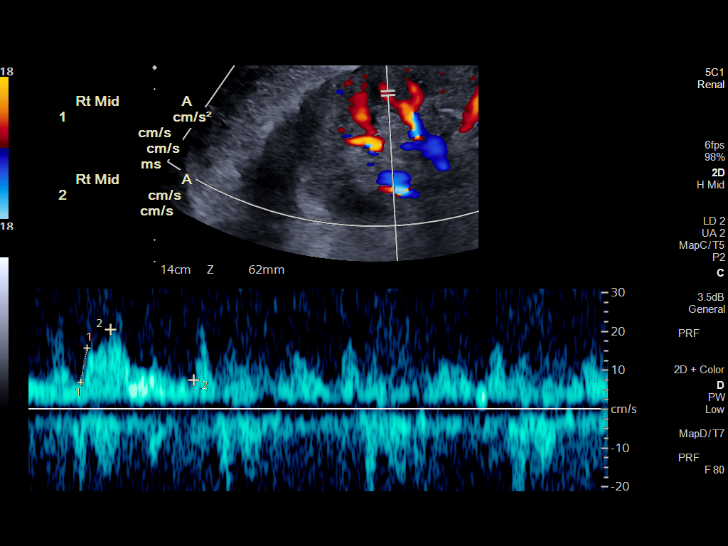
[im 50/60]
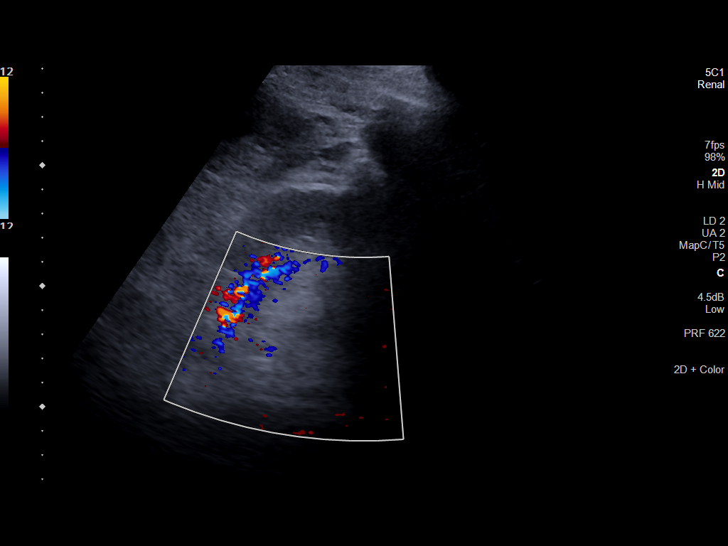
[im 55/60]
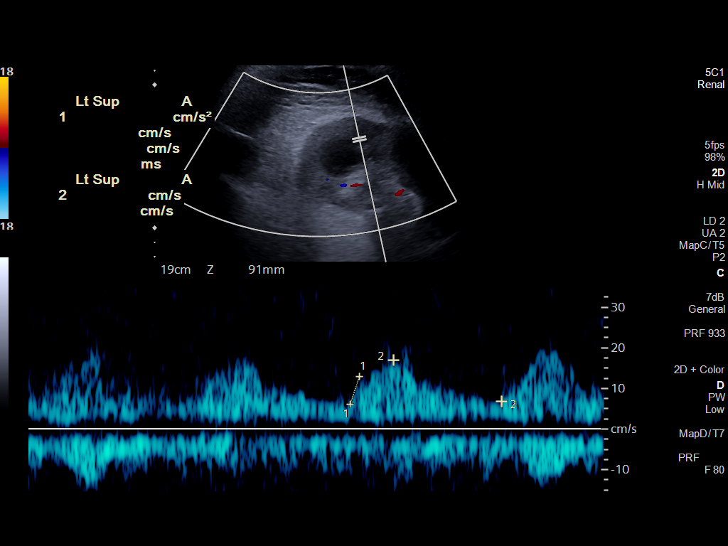
[im 60/60]
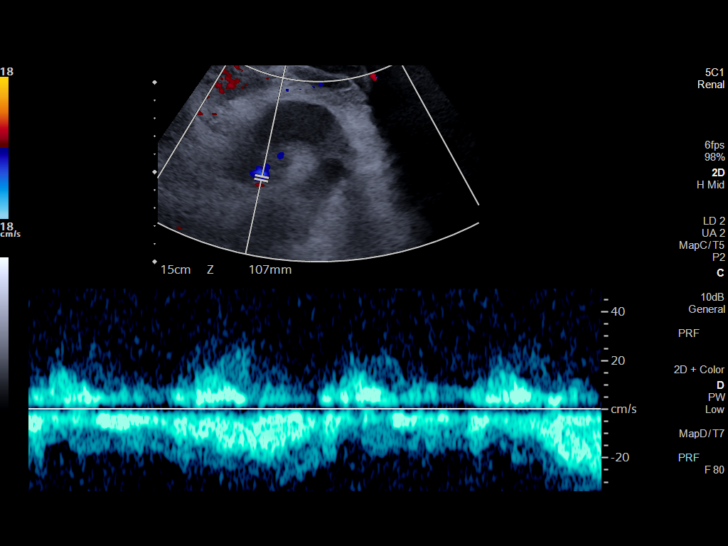

[13 of 25 positions shown; findings below may reference images not displayed]

FINDINGS: Right Kidney:

Length: 13.0 cm. Echogenicity within normal limits. No
hydronephrosis visualized. Anechoic structure in the lateral cortex
of the right kidney partially imaged, present on the comparison CT.

Left Kidney:

Length: 13.1 cm. Echogenicity within normal limits. No
hydronephrosis visualized. There are anechoic cystic structures of
the left kidney compatible with simple cysts, which were present on
the prior CT. The largest in the upper pole measures 3 cm.

Bladder:  Unremarkable

RENAL DUPLEX ULTRASOUND

Right Renal Artery Velocities:

Origin:  109 cm/sec

Mid:  136 cm/sec

Hilum:  70 cm/sec

Interlobar:  64 cm/sec

Arcuate:  25 cm/sec

Left Renal Artery Velocities:

Origin:  92 cm/sec

Mid:  87 cm/sec

Hilum:  108 cm/sec

Interlobar:  36 cm/sec

Arcuate:  26 cm/sec

Aortic Velocity:  85 cm/sec

Right Renal-Aortic Ratios:

Origin:

Mid:

Hilum:

Interlobar:

Arcuate:

Left Renal-Aortic Ratios:

Origin:

Mid:

Hilum:

Interlobar:

Arcuate:
IMPRESSION: Directed duplex of the renal arteries demonstrates no evidence of
high-grade stenosis.

## 2022-11-16 ENCOUNTER — Other Ambulatory Visit: Payer: Self-pay

## 2022-11-16 ENCOUNTER — Emergency Department: Payer: Self-pay

## 2022-11-16 ENCOUNTER — Encounter: Payer: Self-pay | Admitting: Emergency Medicine

## 2022-11-16 ENCOUNTER — Emergency Department
Admission: EM | Admit: 2022-11-16 | Discharge: 2022-11-17 | Disposition: A | Discharge status: Home / Self Care | Payer: Self-pay | Attending: Emergency Medicine | Admitting: Emergency Medicine

## 2022-11-16 DIAGNOSIS — I10 Essential (primary) hypertension: Secondary | ICD-10-CM | POA: Insufficient documentation

## 2022-11-16 DIAGNOSIS — Z7984 Long term (current) use of oral hypoglycemic drugs: Secondary | ICD-10-CM | POA: Insufficient documentation

## 2022-11-16 DIAGNOSIS — D72829 Elevated white blood cell count, unspecified: Secondary | ICD-10-CM | POA: Insufficient documentation

## 2022-11-16 DIAGNOSIS — Z79899 Other long term (current) drug therapy: Secondary | ICD-10-CM | POA: Insufficient documentation

## 2022-11-16 DIAGNOSIS — E119 Type 2 diabetes mellitus without complications: Secondary | ICD-10-CM | POA: Insufficient documentation

## 2022-11-16 DIAGNOSIS — R0789 Other chest pain: Secondary | ICD-10-CM | POA: Insufficient documentation

## 2022-11-16 DIAGNOSIS — Z1152 Encounter for screening for COVID-19: Secondary | ICD-10-CM | POA: Insufficient documentation

## 2022-11-16 DIAGNOSIS — J019 Acute sinusitis, unspecified: Secondary | ICD-10-CM | POA: Insufficient documentation

## 2022-11-16 LAB — BASIC METABOLIC PANEL
Anion gap: 9 (ref 5–15)
BUN: 14 mg/dL (ref 8–23)
CO2: 26 mmol/L (ref 22–32)
Calcium: 8.9 mg/dL (ref 8.9–10.3)
Chloride: 102 mmol/L (ref 98–111)
Creatinine, Ser: 1.47 mg/dL — ABNORMAL HIGH (ref 0.61–1.24)
GFR, Estimated: 52 mL/min — ABNORMAL LOW (ref >60)
Glucose, Bld: 134 mg/dL — ABNORMAL HIGH (ref 70–99)
Potassium: 3.6 mmol/L (ref 3.5–5.1)
Sodium: 137 mmol/L (ref 135–145)

## 2022-11-16 LAB — CBC
HCT: 41 % (ref 39.0–52.0)
Hemoglobin: 12.7 g/dL — ABNORMAL LOW (ref 13.0–17.0)
MCH: 26.7 pg (ref 26.0–34.0)
MCHC: 31 g/dL (ref 30.0–36.0)
MCV: 86.3 fL (ref 80.0–100.0)
Platelets: 290 10*3/uL (ref 150–400)
RBC: 4.75 MIL/uL (ref 4.22–5.81)
RDW: 15.7 % — ABNORMAL HIGH (ref 11.5–15.5)
WBC: 10.8 10*3/uL — ABNORMAL HIGH (ref 4.0–10.5)
nRBC: 0 % (ref 0.0–0.2)

## 2022-11-16 LAB — TROPONIN I (HIGH SENSITIVITY): Troponin I (High Sensitivity): 20 ng/L — ABNORMAL HIGH (ref <18)

## 2022-11-16 NOTE — ED Provider Notes (Signed)
[facility] Provider Note    Event Date/Time   First MD Initiated Contact with Patient 11/16/22 2344     (approximate)   History   Chest Pain   HPI  Mitchell Robbins is a 67 y.o. male with history of hypertension, diabetes who presents to the emergency department with multiple complaints.  States he is having some aching left chest pain that is worse with movement.  Not reproducible with palpation.  No associated shortness of breath, diaphoresis, dizziness, nausea or vomiting.  No lower extremity swelling or pain.  Patient also reports several weeks of sinus pressure, congestion, drainage in his throat.  No cough.  No fever.  Has been using over-the-counter nasal spray without relief.  Has tried Mucinex as well without relief.  Patient also hypertensive here.  He is on clonidine, hydralazine, lisinopril and labetalol.  Reports compliance with all of his medications.  States that sometimes his blood pressure is well-controlled and sometimes it is elevated like tonight.   History provided by patient.    Past Medical History:  Diagnosis Date   Diabetes mellitus without complication (HCC)    Hypertension     Past Surgical History:  Procedure Laterality Date   COLONOSCOPY WITH PROPOFOL N/A 01/21/2018   Procedure: COLONOSCOPY WITH BIOPSIES;  Surgeon: Midge Minium, MD;  Location: [facility];  Service: Endoscopy;  Laterality: N/A;   COLONOSCOPY WITH PROPOFOL N/A 01/11/2019   Procedure: COLONOSCOPY WITH PROPOFOL;  Surgeon: Midge Minium, MD;  Location: [facility] ENDOSCOPY;  Service: Endoscopy;  Laterality: N/A;   COLONOSCOPY WITH PROPOFOL N/A 06/17/2022   Procedure: COLONOSCOPY WITH PROPOFOL;  Surgeon: Midge Minium, MD;  Location: [facility] ENDOSCOPY;  Service: Endoscopy;  Laterality: N/A;   ESOPHAGOGASTRODUODENOSCOPY (EGD) WITH PROPOFOL N/A 12/24/2017   Procedure: ESOPHAGOGASTRODUODENOSCOPY (EGD) WITH PROPOFOL;  Surgeon: Midge Minium, MD;  Location: [facility] ENDOSCOPY;   Service: Endoscopy;  Laterality: N/A;   ESOPHAGOGASTRODUODENOSCOPY (EGD) WITH PROPOFOL N/A 01/21/2018   Procedure: ESOPHAGOGASTRODUODENOSCOPY (EGD) WITH BIOPSIES;  Surgeon: Midge Minium, MD;  Location: [facility];  Service: Endoscopy;  Laterality: N/A;   POLYPECTOMY N/A 01/21/2018   Procedure: POLYPECTOMY;  Surgeon: Midge Minium, MD;  Location: [facility];  Service: Endoscopy;  Laterality: N/A;   THYROIDECTOMY      MEDICATIONS:  Prior to Admission medications   Medication Sig Start Date End Date Taking? Authorizing Provider  atorvastatin (LIPITOR) 40 MG tablet Take 40 mg by mouth daily. 12/17/21   [provider]  cloNIDine (CATAPRES) 0.2 MG tablet Take 0.2 mg by mouth 2 (two) times daily. 03/27/20   [provider]  furosemide (LASIX) 20 MG tablet Take by mouth. 12/30/21 12/30/22  [provider]  glipiZIDE (GLUCOTROL XL) 10 MG 24 hr tablet Take 10 mg by mouth daily. 10/21/21   [provider]  hydrALAZINE (APRESOLINE) 100 MG tablet Take 100 mg by mouth 3 (three) times daily. 07/16/22   [provider]  isosorbide mononitrate (IMDUR) 60 MG 24 hr tablet Take 1 tablet by mouth daily. 12/05/20 07/21/22  [provider]  labetalol (NORMODYNE) 200 MG tablet Take 200 mg by mouth 2 (two) times daily. 04/26/20   [provider]  losartan (COZAAR) 100 MG tablet Take by mouth. 07/15/21   [provider]  meloxicam (MOBIC) 7.5 MG tablet Take 7.5 mg by mouth daily. 12/08/21   [provider]  metFORMIN (GLUCOPHAGE) 1000 MG tablet Take by mouth. 07/15/21 07/21/22  [provider]  traMADol (ULTRAM) 50 MG tablet Take 1  tablet (50 mg total) by mouth every 8 (eight) hours as needed. 07/07/22   Earna Coder, MD    Physical Exam   Triage Vital Signs: ED Triage Vitals  Encounter Vitals Group     BP 11/16/22 2329 (!) 200/108     Systolic BP Percentile --      Diastolic BP Percentile --      Pulse Rate  11/16/22 2329 81     Resp 11/16/22 2329 18     Temp 11/16/22 2329 98.1 F (36.7 C)     Temp Source 11/16/22 2329 Oral     SpO2 11/16/22 2329 97 %     Weight 11/16/22 2327 285 lb (129.3 kg)     Height 11/16/22 2327 5\' 10"  (1.778 m)     Head Circumference --      Peak Flow --      Pain Score 11/16/22 2330 6     Pain Loc --      Pain Education --      Exclude from Growth Chart --     Most recent vital signs: Vitals:   11/17/22 0100 11/17/22 0255  BP: (!) 160/96 (!) 173/104  Pulse: 71 74  Resp:  18  Temp:    SpO2: 95% 93%    CONSTITUTIONAL: Alert, responds appropriately to questions. Well-appearing; well-nourished HEAD: Normocephalic, atraumatic EYES: Conjunctivae clear, pupils appear equal, sclera nonicteric ENT: normal nose; moist mucous membranes; No pharyngeal erythema or petechiae, no tonsillar hypertrophy or exudate, no uvular deviation, no unilateral swelling in posterior oropharynx, no trismus or drooling, no muffled voice, normal phonation, no stridor, airway patent. NECK: Supple, normal ROM CARD: RRR; S1 and S2 appreciated, chest wall nontender to palpation RESP: Normal chest excursion without splinting or tachypnea; breath sounds clear and equal bilaterally; no wheezes, no rhonchi, no rales, no hypoxia or respiratory distress, speaking full sentences ABD/GI: Non-distended; soft, non-tender, no rebound, no guarding, no peritoneal signs BACK: The back appears normal EXT: Normal ROM in all joints; no deformity noted, no edema, no calf tenderness or calf swelling SKIN: Normal color for age and race; warm; no rash on exposed skin NEURO: Moves all extremities equally, normal speech PSYCH: The patient's mood and manner are appropriate.   ED Results / Procedures / Treatments   LABS: (all labs ordered are listed, but only abnormal results are displayed) Labs Reviewed  BASIC METABOLIC PANEL - Abnormal; Notable for the following components:      Result Value   Glucose, Bld  134 (*)    Creatinine, Ser 1.47 (*)    GFR, Estimated 52 (*)    All other components within normal limits  CBC - Abnormal; Notable for the following components:   WBC 10.8 (*)    Hemoglobin 12.7 (*)    RDW 15.7 (*)    All other components within normal limits  TROPONIN I (HIGH SENSITIVITY) - Abnormal; Notable for the following components:   Troponin I (High Sensitivity) 20 (*)    All other components within normal limits  TROPONIN I (HIGH SENSITIVITY) - Abnormal; Notable for the following components:   Troponin I (High Sensitivity) 20 (*)    All other components within normal limits  RESP PANEL BY RT-PCR (RSV, FLU A&B, COVID)  RVPGX2     EKG:  EKG Interpretation Date/Time:  Sunday November 16 2022 23:29:28 EDT Ventricular Rate:  83 PR Interval:  148 QRS Duration:  162 QT Interval:  430 QTC Calculation: 505 R Axis:   10  Text Interpretation: Normal sinus rhythm Right bundle branch block Abnormal ECG When compared with ECG of 27-Apr-2020 21:02, Right bundle branch block is now Present Confirmed by Rochele Raring [id number]) on 11/16/2022 11:46:44 PM         RADIOLOGY: My personal review and interpretation of imaging: Chest x-ray clear.  I have personally reviewed all radiology reports.   DG Chest 2 View  Result Date: 11/16/2022 CLINICAL DATA:  Chest pain EXAM: CHEST - 2 VIEW COMPARISON:  None Available. FINDINGS: The heart size and mediastinal contours are within normal limits. Both lungs are clear. The visualized skeletal structures are unremarkable. IMPRESSION: No active cardiopulmonary disease. Electronically Signed   By: Darliss Cheney M.D.   On: 11/16/2022 23:54     PROCEDURES:  Critical Care performed: No    .1-3 Lead EKG Interpretation  Performed by: Anadelia Kintz, Layla Maw, DO Authorized by: Yesha Muchow, Layla Maw, DO     Interpretation: normal     ECG rate:  74   ECG rate assessment: normal     Rhythm: sinus rhythm     Ectopy: none     Conduction: normal        IMPRESSION / MDM / ASSESSMENT AND PLAN / ED COURSE  I reviewed the triage vital signs and the nursing notes.    Patient here chest pain, sinus pressure and congestion, hypertension.  The patient is on the cardiac monitor to evaluate for evidence of arrhythmia and/or significant heart rate changes.   DIFFERENTIAL DIAGNOSIS (includes but not limited to):   Chest pain seems atypical for ACS.  Could be musculoskeletal in nature.  Doubt PE, dissection.  Sinus pain could be secondary to viral infection, bacterial infection, allergies.   Patient's presentation is most consistent with acute presentation with potential threat to life or bodily function.   PLAN: EKG does show new right bundle branch block compared to previous EKGs.  No other acute abnormality noted.  Labs initiated from triage.  He does have a slight leukocytosis.  Creatinine of 1.47 which is stable for him.  First troponin is 20.  Will obtain second troponin.  Chest x-ray reviewed and interpreted by myself and the radiologist and shows no acute abnormality including pneumonia.  Will check COVID and flu swab as well given his cough and congestion.  Will give Tylenol, Afrin for symptomatic relief.  Will give him his home dose of hydralazine for his hypertension.   MEDICATIONS GIVEN IN ED: Medications  acetaminophen (TYLENOL) tablet 1,000 mg (1,000 mg Oral Given 11/17/22 0013)  hydrALAZINE (APRESOLINE) tablet 100 mg (100 mg Oral Given 11/17/22 0011)  oxymetazoline (AFRIN) 0.05 % nasal spray 1 spray (1 spray Each Nare Given 11/17/22 0020)  amoxicillin-clavulanate (AUGMENTIN) 875-125 MG per tablet 1 tablet (1 tablet Oral Given 11/17/22 0256)     ED COURSE: Blood pressure has improved slightly with home medications.  Second troponin is flat.  COVID, flu and RSV negative.  Given symptoms of sinus congestion, pressure and drainage have been ongoing for several weeks, will trial course of antibiotics.  Recommended over-the-counter  antihistamines as well.  Recommended he talk to his primary care doctor about his blood pressure.  I do not feel he needs admission for his chest pain as again it seems very atypical and his cardiac enzymes are negative but recommended close follow-up with his PCP.  Patient comfortable with this plan.   At this time, I do not feel there is any life-threatening condition present. I reviewed all nursing notes, vitals, pertinent  previous records.  All lab and urine results, EKGs, imaging ordered have been independently reviewed and interpreted by myself.  I reviewed all available radiology reports from any imaging ordered this visit.  Based on my assessment, I feel the patient is safe to be discharged home without further emergent workup and can continue workup as an outpatient as needed. Discussed all findings, treatment plan as well as usual and customary return precautions.  They verbalize understanding and are comfortable with this plan.  Outpatient follow-up has been provided as needed.  All questions have been answered.    CONSULTS:  none   OUTSIDE RECORDS REVIEWED: Reviewed last internal medicine visit on 09/29/2022.       FINAL CLINICAL IMPRESSION(S) / ED DIAGNOSES   Final diagnoses:  Atypical chest pain  Subacute sinusitis, unspecified location  Uncontrolled hypertension     Rx / DC Orders   ED Discharge Orders          Ordered    amoxicillin-clavulanate (AUGMENTIN) 875-125 MG tablet  2 times daily        11/17/22 0240             Note:  This document was prepared using Dragon voice recognition software and may include unintentional dictation errors.   Kariana Wiles, Layla Maw, DO 11/17/22 0500

## 2022-11-16 NOTE — ED Triage Notes (Signed)
Pt presents ambulatory to triage via POV with complaints of L sided CP that started several days ago with associated SOB. Hx of HTN and endorses compliance with meds. Pt rates the CP 7/10 without radiation. A&Ox4 at this time. Denies fevers, chills, abdominal pain, N/V/D.

## 2022-11-17 LAB — RESP PANEL BY RT-PCR (RSV, FLU A&B, COVID)  RVPGX2
Influenza A by PCR: NEGATIVE
Influenza B by PCR: NEGATIVE
Resp Syncytial Virus by PCR: NEGATIVE
SARS Coronavirus 2 by RT PCR: NEGATIVE

## 2022-11-17 LAB — TROPONIN I (HIGH SENSITIVITY): Troponin I (High Sensitivity): 20 ng/L — ABNORMAL HIGH (ref <18)

## 2022-11-17 MED ORDER — AMOXICILLIN-POT CLAVULANATE 875-125 MG PO TABS: 1.0000 | ORAL_TABLET | Freq: Two times a day (BID) | ORAL | 0 refills | Status: DC

## 2022-11-17 MED ORDER — AMOXICILLIN-POT CLAVULANATE 875-125 MG PO TABS
1.0000 | ORAL_TABLET | Freq: Once | ORAL | Status: AC
  Administered 2022-11-17: 1 via ORAL
  Filled 2022-11-17: qty 1

## 2022-11-17 MED ORDER — HYDRALAZINE HCL 50 MG PO TABS
100.0000 mg | ORAL_TABLET | Freq: Once | ORAL | Status: AC
  Administered 2022-11-17: 100 mg via ORAL
  Filled 2022-11-17: qty 2

## 2022-11-17 MED ORDER — ACETAMINOPHEN 500 MG PO TABS
1000.0000 mg | ORAL_TABLET | Freq: Once | ORAL | Status: AC
  Administered 2022-11-17: 1000 mg via ORAL
  Filled 2022-11-17: qty 2

## 2022-11-17 MED ORDER — OXYMETAZOLINE HCL 0.05 % NA SOLN
1.0000 | Freq: Once | NASAL | Status: AC
  Administered 2022-11-17: 1 via NASAL
  Filled 2022-11-17: qty 30

## 2022-11-17 NOTE — Discharge Instructions (Addendum)
You may use Tylenol 1000 mg every 6 hours as needed for pain, fever.  You may use over-the-counter Zyrtec, Mucinex, Afrin nasal spray, saline nasal spray to help with symptoms.  Please do not use Afrin for more than 3 days as this can cause worsening congestion.  Please do not take Sudafed, phenylephrine over-the-counter due to your history of high blood pressure.  Your COVID and flu test were negative.  I have started you on antibiotics to cover for any possible bacterial cause of your sinus infection given symptoms ongoing for several weeks.  Please follow-up closely with your primary care doctor given your elevated blood pressures today and your chest pain.  Your cardiac labs, EKG were reassuring today.

## 2022-11-17 NOTE — Group Note (Deleted)

## 2022-12-31 ENCOUNTER — Ambulatory Visit (INDEPENDENT_AMBULATORY_CARE_PROVIDER_SITE_OTHER): Payer: Self-pay | Admitting: Urology

## 2022-12-31 ENCOUNTER — Ambulatory Visit
Admission: RE | Admit: 2022-12-31 | Discharge: 2022-12-31 | Disposition: A | Discharge status: Home / Self Care | Payer: Self-pay | Source: Ambulatory Visit | Attending: Urology | Admitting: Urology

## 2022-12-31 ENCOUNTER — Ambulatory Visit
Admission: EM | Admit: 2022-12-31 | Discharge: 2022-12-31 | Disposition: A | Discharge status: Home / Self Care | Payer: Self-pay | Attending: Urology | Admitting: Urology

## 2022-12-31 ENCOUNTER — Encounter: Payer: Self-pay | Admitting: Urology

## 2022-12-31 ENCOUNTER — Ambulatory Visit: Payer: Self-pay | Admitting: Urology

## 2022-12-31 ENCOUNTER — Other Ambulatory Visit: Payer: Self-pay | Admitting: *Deleted

## 2022-12-31 VITALS — BP 154/94 | HR 82 | Ht 71.0 in | Wt 292.0 lb

## 2022-12-31 DIAGNOSIS — N200 Calculus of kidney: Secondary | ICD-10-CM | POA: Insufficient documentation

## 2022-12-31 DIAGNOSIS — N400 Enlarged prostate without lower urinary tract symptoms: Secondary | ICD-10-CM

## 2022-12-31 DIAGNOSIS — Z125 Encounter for screening for malignant neoplasm of prostate: Secondary | ICD-10-CM

## 2022-12-31 LAB — BLADDER SCAN AMB NON-IMAGING

## 2022-12-31 NOTE — Progress Notes (Signed)
12/31/2022 11:38 AM   Orland Penman 11/07/1955 [id number]  Reason for visit: Follow up PSA screening, BPH, nephrolithiasis  HPI: 67 year old male with family history of prostate cancer in his brother(type and treatment unknown), asymptomatic 2 cm right lower pole stone without hydronephrosis, and 110 g prostate on CT scan with minimal BPH symptoms.  He really denies any urinary complaints today, PVR normal at 5ml.Marland Kitchen  He denies gross hematuria.  No history of UTIs or urinary retention.  He is on doxazosin long-term through PCP.  Most recent PSA from October 2023 normal and stable at 3.71 with extremely reassuring PSA density of 0.03.  With his other comorbidities and significant BPH, would only recommend further investigation of elevated PSA.  Significant increase.  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  I personally viewed and interpreted the CT scan from February 2024, as well as the KUB today that shows a stable 2 cm right lower pole stone.  He again defers any intervention and would like to continue surveillance, which I think is reasonable since the stone has been stable for at least 20 years.  At this point he would like to follow-up as needed, return precautions were discussed at length Can discontinue PSA screening at age 50.  AUA guidelines  Sondra Come, MD  [facility] [address] [place], [place] [address] [phone]

## 2023-01-07 ENCOUNTER — Ambulatory Visit: Payer: Self-pay | Admitting: Urology

## 2023-01-14 ENCOUNTER — Telehealth: Payer: Self-pay | Admitting: Gastroenterology

## 2023-01-14 NOTE — Telephone Encounter (Signed)
Checking for provider.

## 2023-05-11 ENCOUNTER — Other Ambulatory Visit
Admission: RE | Admit: 2023-05-11 | Discharge: 2023-05-11 | Disposition: A | Discharge status: Home / Self Care | Payer: Self-pay | Source: Ambulatory Visit | Attending: Physician Assistant | Admitting: Physician Assistant

## 2023-05-11 DIAGNOSIS — R0789 Other chest pain: Secondary | ICD-10-CM | POA: Insufficient documentation

## 2023-05-11 DIAGNOSIS — R0609 Other forms of dyspnea: Secondary | ICD-10-CM | POA: Insufficient documentation

## 2023-05-11 LAB — TROPONIN I (HIGH SENSITIVITY): Troponin I (High Sensitivity): 20 ng/L — ABNORMAL HIGH (ref <18)

## 2023-05-29 ENCOUNTER — Other Ambulatory Visit: Payer: Self-pay | Admitting: Cardiology

## 2023-05-29 DIAGNOSIS — R0609 Other forms of dyspnea: Secondary | ICD-10-CM

## 2023-05-29 DIAGNOSIS — Z720 Tobacco use: Secondary | ICD-10-CM

## 2023-05-29 DIAGNOSIS — E118 Type 2 diabetes mellitus with unspecified complications: Secondary | ICD-10-CM

## 2023-05-29 DIAGNOSIS — R0789 Other chest pain: Secondary | ICD-10-CM

## 2023-06-04 ENCOUNTER — Ambulatory Visit: Payer: Self-pay

## 2023-06-09 ENCOUNTER — Encounter
Admission: RE | Admit: 2023-06-09 | Discharge: 2023-06-09 | Disposition: A | Discharge status: Home / Self Care | Payer: Self-pay | Source: Ambulatory Visit | Attending: Cardiology | Admitting: Cardiology

## 2023-06-09 DIAGNOSIS — E118 Type 2 diabetes mellitus with unspecified complications: Secondary | ICD-10-CM

## 2023-06-09 DIAGNOSIS — R0789 Other chest pain: Secondary | ICD-10-CM

## 2023-06-09 DIAGNOSIS — Z720 Tobacco use: Secondary | ICD-10-CM

## 2023-06-09 DIAGNOSIS — R0609 Other forms of dyspnea: Secondary | ICD-10-CM

## 2023-06-09 MED ORDER — TECHNETIUM TC 99M TETROFOSMIN IV KIT
10.9900 | PACK | Freq: Once | INTRAVENOUS | Status: AC | PRN
  Administered 2023-06-09: 10.99 via INTRAVENOUS

## 2023-10-13 ENCOUNTER — Other Ambulatory Visit: Payer: Self-pay

## 2023-10-13 ENCOUNTER — Emergency Department: Payer: Self-pay

## 2023-10-13 DIAGNOSIS — E119 Type 2 diabetes mellitus without complications: Secondary | ICD-10-CM | POA: Insufficient documentation

## 2023-10-13 DIAGNOSIS — J449 Chronic obstructive pulmonary disease, unspecified: Secondary | ICD-10-CM | POA: Insufficient documentation

## 2023-10-13 DIAGNOSIS — I10 Essential (primary) hypertension: Secondary | ICD-10-CM | POA: Insufficient documentation

## 2023-10-13 DIAGNOSIS — M545 Low back pain, unspecified: Secondary | ICD-10-CM | POA: Insufficient documentation

## 2023-10-13 DIAGNOSIS — M79605 Pain in left leg: Secondary | ICD-10-CM | POA: Insufficient documentation

## 2023-10-13 NOTE — ED Triage Notes (Signed)
 Pt reports left tight pain xfew months, pt denies known injury or wound to leg. No swelling or redness reported.

## 2023-10-14 ENCOUNTER — Emergency Department
Admission: EM | Admit: 2023-10-14 | Discharge: 2023-10-14 | Disposition: A | Discharge status: Home / Self Care | Payer: Self-pay | Attending: Emergency Medicine | Admitting: Emergency Medicine

## 2023-10-14 ENCOUNTER — Emergency Department: Payer: Self-pay

## 2023-10-14 DIAGNOSIS — M79605 Pain in left leg: Secondary | ICD-10-CM

## 2023-10-14 MED ORDER — TRAMADOL HCL 50 MG PO TABS
50.0000 mg | ORAL_TABLET | Freq: Four times a day (QID) | ORAL | 0 refills | Status: AC | PRN
Start: 2023-10-14 — End: 2023-10-19

## 2023-10-14 MED ORDER — PREDNISONE 50 MG PO TABS: 50.0000 mg | ORAL_TABLET | Freq: Every day | ORAL | 0 refills | Status: AC

## 2023-10-14 NOTE — ED Provider Notes (Signed)
 [facility] Provider Note    Event Date/Time   First MD Initiated Contact with Patient 10/14/23 0150     (approximate)   History   Leg Pain   HPI  Mitchell Robbins is a 68 y.o. male with history of COPD, A-fib, hypertension, hyperlipidemia, and type 2 diabetes who presents with left leg pain which has been present for at least a few months, worse over the last few weeks, and with an acute exacerbation this evening.  He denies any trauma to the leg.  He states that he had similar pain a few years ago when he was diagnosed with a pinched nerve in his leg.  He states that he is not having any pain currently.  The pain is worse when he walks but improves with rest.  He has no numbness or weakness.  He denies any swelling.  He does have some low back pain.  I reviewed the past medical records.  The patient's most recent outpatient encounter was with internal medicine on 7/3 for right ankle and foot pain diagnosis possible gout.     Physical Exam   Triage Vital Signs: ED Triage Vitals  Encounter Vitals Group     BP 10/13/23 2300 (!) 175/96     Girls Systolic BP Percentile --      Girls Diastolic BP Percentile --      Boys Systolic BP Percentile --      Boys Diastolic BP Percentile --      Pulse Rate 10/13/23 2300 87     Resp 10/13/23 2300 20     Temp 10/13/23 2300 98.5 F (36.9 C)     Temp Source 10/13/23 2300 Oral     SpO2 10/13/23 2300 97 %     Weight 10/13/23 2259 290 lb (131.5 kg)     Height 10/13/23 2259 5' 10 (1.778 m)     Head Circumference --      Peak Flow --      Pain Score 10/13/23 2259 7     Pain Loc --      Pain Education --      Exclude from Growth Chart --     Most recent vital signs: Vitals:   10/14/23 0205 10/14/23 0330  BP: (!) 159/80 (!) 160/96  Pulse: 75 74  Resp:  18  Temp:    SpO2: 98% 94%     General: Alert, comfortable appearing, no distress.  CV:  Good peripheral perfusion.  Resp:  Normal effort.  Abd:  No  distention.  Other:  No midline spinal tenderness.  Mild left paraspinal muscle tenderness.  5/5 motor strength and intact sensation to the left lower extremity.  Normal cap refill and color.  No calf swelling or tenderness.  Negative straight leg raise test.   ED Results / Procedures / Treatments   Labs (all labs ordered are listed, but only abnormal results are displayed) Labs Reviewed - No data to display   EKG    RADIOLOGY  XR R femur, pelvis: I independently viewed and interpreted the images; there is no acute fracture.  Radiology report indicates the following:  IMPRESSION:  1. No evidence of fracture or other focal bone lesions in the pelvis  or left femur.  2. 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  3. Advanced degenerative change lower lumbar spine.  4. Small ossicle adjacent the upper medial aspect of the medial  femoral condyle which could be due to a dystrophic  calcification  such as a remote MCL injury.   US  venous LLE: No acute DVT   PROCEDURES:  Critical Care performed: No  Procedures   MEDICATIONS ORDERED IN ED: Medications - No data to display   IMPRESSION / MDM / ASSESSMENT AND PLAN / ED COURSE  I reviewed the triage vital signs and the nursing notes.  68 year old male with PMH as noted above presents with acute on chronic left lower extremity pain although is not in any pain currently.  He describes worsening pain with walking.  Differential diagnosis includes, but is not limited to, sciatica, other neuropathic etiology, less likely claudications due to vascular issue.  The patient is not having any signs on exam of a vascular occlusion.  I have a low suspicion for DVT.  X-rays were obtained from triage that show no acute findings.  We will obtain an ultrasound for further evaluation.  If this is negative I anticipate discharge home.  Patient's presentation is most consistent with acute complicated illness / injury requiring diagnostic  workup.  ----------------------------------------- 3:45 AM on 10/14/2023 -----------------------------------------  Ultrasound is negative for DVT.  The patient remains pain-free.  He is stable for discharge home at this time.  I counseled him on the results of the workup and plan of care.  Overall I favor sciatica or other radiculopathy as the etiology of the patient's pain.  I have prescribed a course of steroid as well as some tramadol  for pain.  I gave strict return precautions, and the patient expressed understanding.   FINAL CLINICAL IMPRESSION(S) / ED DIAGNOSES   Final diagnoses:  Left leg pain     Rx / DC Orders   ED Discharge Orders          Ordered    predniSONE  (DELTASONE ) 50 MG tablet  Daily        10/14/23 0331    traMADol  (ULTRAM ) 50 MG tablet  Every 6 hours PRN        10/14/23 0331             Note:  This document was prepared using Dragon voice recognition software and may include unintentional dictation errors.    Jacolyn Pae, MD 10/14/23 [phone]

## 2023-10-14 NOTE — ED Notes (Signed)
 C/o  left leg pain x months. States hx of pinched nerve in back and feels like that pain. Denies pain at this time but states pain when walking.

## 2023-10-14 NOTE — Discharge Instructions (Signed)
 You likely have a pinched nerve of the left leg.  Sometimes leg pain like this can also be caused by problems in your circulation.  Make an appointment to follow-up with your primary care provider.  Take the prednisone  as prescribed for the next 5 days.  You may take the tramadol  as needed for pain.  Return to the ER for new, worsening, or persistent severe leg pain, weakness or numbness, difficulty walking, or any other new or worsening symptoms that concern you.

## 2023-10-25 ENCOUNTER — Inpatient Hospital Stay
Admission: EM | Admit: 2023-10-25 | Discharge: 2023-10-27 | DRG: 378 | Disposition: A | Discharge status: Home / Self Care | Payer: Self-pay | Attending: Internal Medicine | Admitting: Internal Medicine

## 2023-10-25 ENCOUNTER — Other Ambulatory Visit: Payer: Self-pay

## 2023-10-25 ENCOUNTER — Emergency Department: Payer: Self-pay

## 2023-10-25 DIAGNOSIS — N1831 Chronic kidney disease, stage 3a: Secondary | ICD-10-CM | POA: Diagnosis present

## 2023-10-25 DIAGNOSIS — D72829 Elevated white blood cell count, unspecified: Secondary | ICD-10-CM | POA: Diagnosis present

## 2023-10-25 DIAGNOSIS — Z8711 Personal history of peptic ulcer disease: Secondary | ICD-10-CM

## 2023-10-25 DIAGNOSIS — K269 Duodenal ulcer, unspecified as acute or chronic, without hemorrhage or perforation: Secondary | ICD-10-CM

## 2023-10-25 DIAGNOSIS — D649 Anemia, unspecified: Secondary | ICD-10-CM

## 2023-10-25 DIAGNOSIS — Z8249 Family history of ischemic heart disease and other diseases of the circulatory system: Secondary | ICD-10-CM

## 2023-10-25 DIAGNOSIS — K279 Peptic ulcer, site unspecified, unspecified as acute or chronic, without hemorrhage or perforation: Secondary | ICD-10-CM | POA: Diagnosis present

## 2023-10-25 DIAGNOSIS — D62 Acute posthemorrhagic anemia: Secondary | ICD-10-CM | POA: Diagnosis present

## 2023-10-25 DIAGNOSIS — R0609 Other forms of dyspnea: Secondary | ICD-10-CM

## 2023-10-25 DIAGNOSIS — K259 Gastric ulcer, unspecified as acute or chronic, without hemorrhage or perforation: Secondary | ICD-10-CM | POA: Diagnosis present

## 2023-10-25 DIAGNOSIS — K921 Melena: Principal | ICD-10-CM

## 2023-10-25 DIAGNOSIS — Z6841 Body Mass Index (BMI) 40.0 and over, adult: Secondary | ICD-10-CM

## 2023-10-25 DIAGNOSIS — Z7984 Long term (current) use of oral hypoglycemic drugs: Secondary | ICD-10-CM

## 2023-10-25 DIAGNOSIS — I129 Hypertensive chronic kidney disease with stage 1 through stage 4 chronic kidney disease, or unspecified chronic kidney disease: Secondary | ICD-10-CM | POA: Diagnosis present

## 2023-10-25 DIAGNOSIS — K219 Gastro-esophageal reflux disease without esophagitis: Secondary | ICD-10-CM | POA: Diagnosis present

## 2023-10-25 DIAGNOSIS — Z79899 Other long term (current) drug therapy: Secondary | ICD-10-CM

## 2023-10-25 DIAGNOSIS — Z720 Tobacco use: Principal | ICD-10-CM

## 2023-10-25 DIAGNOSIS — Z833 Family history of diabetes mellitus: Secondary | ICD-10-CM

## 2023-10-25 DIAGNOSIS — E1122 Type 2 diabetes mellitus with diabetic chronic kidney disease: Secondary | ICD-10-CM | POA: Diagnosis present

## 2023-10-25 DIAGNOSIS — E66813 Obesity, class 3: Secondary | ICD-10-CM | POA: Diagnosis present

## 2023-10-25 DIAGNOSIS — Z860100 Personal history of colon polyps, unspecified: Secondary | ICD-10-CM

## 2023-10-25 DIAGNOSIS — K264 Chronic or unspecified duodenal ulcer with hemorrhage: Principal | ICD-10-CM | POA: Diagnosis present

## 2023-10-25 DIAGNOSIS — K922 Gastrointestinal hemorrhage, unspecified: Secondary | ICD-10-CM | POA: Diagnosis present

## 2023-10-25 DIAGNOSIS — F17210 Nicotine dependence, cigarettes, uncomplicated: Secondary | ICD-10-CM | POA: Diagnosis present

## 2023-10-25 DIAGNOSIS — I4510 Unspecified right bundle-branch block: Secondary | ICD-10-CM | POA: Diagnosis present

## 2023-10-25 DIAGNOSIS — I1A0 Resistant hypertension: Secondary | ICD-10-CM | POA: Diagnosis present

## 2023-10-25 HISTORY — DX: Peptic ulcer, site unspecified, unspecified as acute or chronic, without hemorrhage or perforation: K27.9

## 2023-10-25 LAB — CBC
HCT: 24.3 % — ABNORMAL LOW (ref 39.0–52.0)
Hemoglobin: 7.4 g/dL — ABNORMAL LOW (ref 13.0–17.0)
MCH: 25.8 pg — ABNORMAL LOW (ref 26.0–34.0)
MCHC: 30.5 g/dL (ref 30.0–36.0)
MCV: 84.7 fL (ref 80.0–100.0)
Platelets: 254 K/uL (ref 150–400)
RBC: 2.87 MIL/uL — ABNORMAL LOW (ref 4.22–5.81)
RDW: 17.7 % — ABNORMAL HIGH (ref 11.5–15.5)
WBC: 15.5 K/uL — ABNORMAL HIGH (ref 4.0–10.5)
nRBC: 0 % (ref 0.0–0.2)

## 2023-10-25 LAB — TROPONIN I (HIGH SENSITIVITY)
Troponin I (High Sensitivity): 18 ng/L — ABNORMAL HIGH (ref <18)
Troponin I (High Sensitivity): 18 ng/L — ABNORMAL HIGH (ref <18)

## 2023-10-25 LAB — COMPREHENSIVE METABOLIC PANEL WITH GFR
ALT: 22 U/L (ref 0–44)
AST: 19 U/L (ref 15–41)
Albumin: 3.1 g/dL — ABNORMAL LOW (ref 3.5–5.0)
Alkaline Phosphatase: 57 U/L (ref 38–126)
Anion gap: 10 (ref 5–15)
BUN: 26 mg/dL — ABNORMAL HIGH (ref 8–23)
CO2: 26 mmol/L (ref 22–32)
Calcium: 8.2 mg/dL — ABNORMAL LOW (ref 8.9–10.3)
Chloride: 102 mmol/L (ref 98–111)
Creatinine, Ser: 1.44 mg/dL — ABNORMAL HIGH (ref 0.61–1.24)
GFR, Estimated: 53 mL/min — ABNORMAL LOW (ref >60)
Glucose, Bld: 204 mg/dL — ABNORMAL HIGH (ref 70–99)
Potassium: 3.9 mmol/L (ref 3.5–5.1)
Sodium: 138 mmol/L (ref 135–145)
Total Bilirubin: 0.6 mg/dL (ref 0.0–1.2)
Total Protein: 5.9 g/dL — ABNORMAL LOW (ref 6.5–8.1)

## 2023-10-25 LAB — BRAIN NATRIURETIC PEPTIDE: B Natriuretic Peptide: 33.8 pg/mL (ref 0.0–100.0)

## 2023-10-25 LAB — FERRITIN: Ferritin: 5 ng/mL — ABNORMAL LOW (ref 24–336)

## 2023-10-25 LAB — GLUCOSE, CAPILLARY: Glucose-Capillary: 137 mg/dL — ABNORMAL HIGH (ref 70–99)

## 2023-10-25 MED ORDER — SODIUM CHLORIDE 0.9% FLUSH: 3.0000 mL | INTRAVENOUS | Status: DC | PRN

## 2023-10-25 MED ORDER — LABETALOL HCL 100 MG PO TABS
200.0000 mg | ORAL_TABLET | Freq: Two times a day (BID) | ORAL | Status: DC
  Administered 2023-10-25 – 2023-10-27 (×4): 200 mg via ORAL
  Filled 2023-10-25 (×2): qty 2
  Filled 2023-10-25: qty 1
  Filled 2023-10-25: qty 2
  Filled 2023-10-25: qty 1
  Filled 2023-10-25: qty 2

## 2023-10-25 MED ORDER — INSULIN ASPART 100 UNIT/ML IJ SOLN: 0.0000 [IU] | Freq: Every day | INTRAMUSCULAR | Status: DC

## 2023-10-25 MED ORDER — PANTOPRAZOLE SODIUM 40 MG IV SOLR
40.0000 mg | Freq: Two times a day (BID) | INTRAVENOUS | Status: DC
  Administered 2023-10-26 – 2023-10-27 (×3): 40 mg via INTRAVENOUS
  Filled 2023-10-25 (×4): qty 10

## 2023-10-25 MED ORDER — HYDRALAZINE HCL 50 MG PO TABS
100.0000 mg | ORAL_TABLET | Freq: Three times a day (TID) | ORAL | Status: DC
  Administered 2023-10-25 – 2023-10-27 (×6): 100 mg via ORAL
  Filled 2023-10-25 (×6): qty 2

## 2023-10-25 MED ORDER — CLONIDINE HCL 0.1 MG PO TABS
0.3000 mg | ORAL_TABLET | Freq: Three times a day (TID) | ORAL | Status: DC
  Administered 2023-10-25 – 2023-10-27 (×6): 0.3 mg via ORAL
  Filled 2023-10-25 (×6): qty 3

## 2023-10-25 MED ORDER — SODIUM CHLORIDE 0.9 % IV SOLN
250.0000 mL | INTRAVENOUS | Status: AC | PRN
Start: 2023-10-25 — End: 2023-10-26
  Administered 2023-10-26: 250 mL via INTRAVENOUS

## 2023-10-25 MED ORDER — LOSARTAN POTASSIUM 50 MG PO TABS: 100.0000 mg | ORAL_TABLET | Freq: Every day | ORAL | Status: DC

## 2023-10-25 MED ORDER — SODIUM CHLORIDE 0.9% FLUSH
3.0000 mL | Freq: Two times a day (BID) | INTRAVENOUS | Status: DC
  Administered 2023-10-25 – 2023-10-27 (×4): 3 mL via INTRAVENOUS

## 2023-10-25 MED ORDER — DOXAZOSIN MESYLATE 4 MG PO TABS
8.0000 mg | ORAL_TABLET | Freq: Every day | ORAL | Status: DC
Start: 2023-10-25 — End: 2023-10-27
  Administered 2023-10-25 – 2023-10-26 (×2): 8 mg via ORAL
  Filled 2023-10-25 (×3): qty 2

## 2023-10-25 MED ORDER — PANTOPRAZOLE SODIUM 40 MG IV SOLR
80.0000 mg | Freq: Once | INTRAVENOUS | Status: AC
  Administered 2023-10-25: 80 mg via INTRAVENOUS
  Filled 2023-10-25: qty 20

## 2023-10-25 MED ORDER — ISOSORBIDE MONONITRATE ER 30 MG PO TB24
60.0000 mg | ORAL_TABLET | Freq: Every day | ORAL | Status: DC
  Administered 2023-10-26 – 2023-10-27 (×2): 60 mg via ORAL
  Filled 2023-10-25: qty 1
  Filled 2023-10-25: qty 2

## 2023-10-25 MED ORDER — INSULIN ASPART 100 UNIT/ML IJ SOLN
0.0000 [IU] | Freq: Three times a day (TID) | INTRAMUSCULAR | Status: DC
  Administered 2023-10-26 – 2023-10-27 (×3): 2 [IU] via SUBCUTANEOUS
  Filled 2023-10-25 (×3): qty 1

## 2023-10-25 NOTE — ED Triage Notes (Signed)
 Pt to ED POV for SOB since 3 days and dark tarry stools since 1 week. Had a little chest discomfort earlier today and has been having intermittent dizziness since about 2 days.   Pt speaking in full sentences. Respirations unlabored. Slightly tachypneic. Large abdomen.   Hx bleeding peptic ulcer 6 years ago.

## 2023-10-25 NOTE — ED Provider Notes (Signed)
 [facility] Provider Note    Event Date/Time   First MD Initiated Contact with Patient 10/25/23 1546     (approximate)   History   Shortness of Breath and Melena   HPI  Mitchell Robbins is a 68 y.o. male  with h/o PUD, DM, HTN, not on blood thinners presenting with 1 week of melena.  States he has dyspnea on exertion, not at rest, no chest pain, intermittent lightheadedness.  No recent trauma or falls, is not on any blood thinning medication.  No nausea vomiting diarrhea, no fever or back pain.  On independent chart review, his last EGD was in 2019, showed normal esophagus, normal stomach had duodenal polyps were biopsied.  Had a colonoscopy in 2020 for that showed multiple polyps that were resected.      Physical Exam   Triage Vital Signs: ED Triage Vitals  Encounter Vitals Group     BP 10/25/23 1422 132/76     Girls Systolic BP Percentile --      Girls Diastolic BP Percentile --      Boys Systolic BP Percentile --      Boys Diastolic BP Percentile --      Pulse Rate 10/25/23 1422 (!) 110     Resp 10/25/23 1422 (!) 22     Temp 10/25/23 1422 98.3 F (36.8 C)     Temp Source 10/25/23 1422 Oral     SpO2 10/25/23 1422 98 %     Weight 10/25/23 1429 290 lb (131.5 kg)     Height 10/25/23 1429 5' 10 (1.778 m)     Head Circumference --      Peak Flow --      Pain Score 10/25/23 1423 0     Pain Loc --      Pain Education --      Exclude from Growth Chart --     Most recent vital signs: Vitals:   10/25/23 1422 10/25/23 1635  BP: 132/76 (!) 140/82  Pulse: (!) 110 (!) 104  Resp: (!) 22 (!) 25  Temp: 98.3 F (36.8 C)   SpO2: 98% 100%     General: Awake, no distress.  CV:  Good peripheral perfusion.  Resp:  Normal effort.  Abd:  No distention.  Soft nontender Other:  Rectal exam was done with chaperone, no external hemorrhoids, he has melanotic stool that is Hemoccult positive.   ED Results / Procedures / Treatments   Labs (all labs  ordered are listed, but only abnormal results are displayed) Labs Reviewed  COMPREHENSIVE METABOLIC PANEL WITH GFR - Abnormal; Notable for the following components:      Result Value   Glucose, Bld 204 (*)    BUN 26 (*)    Creatinine, Ser 1.44 (*)    Calcium 8.2 (*)    Total Protein 5.9 (*)    Albumin 3.1 (*)    GFR, Estimated 53 (*)    All other components within normal limits  CBC - Abnormal; Notable for the following components:   WBC 15.5 (*)    RBC 2.87 (*)    Hemoglobin 7.4 (*)    HCT 24.3 (*)    MCH 25.8 (*)    RDW 17.7 (*)    All other components within normal limits  BRAIN NATRIURETIC PEPTIDE  POC OCCULT BLOOD, ED  TYPE AND SCREEN  TROPONIN I (HIGH SENSITIVITY)     EKG  EKG shows, sinus rhythm, rate 109, 1 QRS, QTc is  prolonged, right bundle branch block noted, no obvious ischemic ST elevation, T wave inversion to V3, T wave inversion is new compared to prior, right bundle branch is old.   RADIOLOGY On my independent interpretation, chest x-ray without obvious consolidation   PROCEDURES:  Critical Care performed: No  Procedures   MEDICATIONS ORDERED IN ED: Medications  pantoprazole  (PROTONIX ) injection 80 mg (80 mg Intravenous Given 10/25/23 1628)     IMPRESSION / MDM / ASSESSMENT AND PLAN / ED COURSE  I reviewed the triage vital signs and the nursing notes.                              Differential diagnosis includes, but is not limited to, anemia, GI bleed, peptic ulcer disease, gastritis.  For shortness of breath on exertion, suspect this might be due to the anemia but also considered angina, atypical ACS, no infectious symptoms suggest pneumonia.  Will get labs, EKG, troponin, chest x-ray.   Patient's presentation is most consistent with acute presentation with potential threat to life or bodily function.  Independent interpretation of labs and imaging below.  Consulted hospitalist was agreeable with plan for admission and will evaluate the  patient.  He is admitted.  The patient is on the cardiac monitor to evaluate for evidence of arrhythmia and/or significant heart rate changes.   Clinical Course as of 10/25/23 1705  Sun Oct 25, 2023  1615 DG Chest 2 View No active cardiopulmonary disease.  [TT]  1705 Independent review of labs, BNP is not elevated, electrolytes severely deranged, creatinine is elevated but this is consistent compared to prior, he is leukocytosis, also anemic. [TT]    Clinical Course User Index [TT] Waymond, Lorelle Cummins, MD     FINAL CLINICAL IMPRESSION(S) / ED DIAGNOSES   Final diagnoses:  DOE (dyspnea on exertion)  Melena  Anemia, unspecified type     Rx / DC Orders   ED Discharge Orders     None        Note:  This document was prepared using Dragon voice recognition software and may include unintentional dictation errors.    Waymond Lorelle Cummins, MD 10/25/23 [phone]

## 2023-10-25 NOTE — H&P (Signed)
 History and Physical    Mitchell Robbins FMW:[id number] DOB: 11-Dec-1955 DOA: 10/25/2023  PCP: Auston Reyes BIRCH, MD  Patient coming from: home   Chief Complaint: melena  HPI: Mitchell Robbins is a 68 y.o. male with medical history significant for upper gi bleed from duodenal ulcer in 2019, obesity, dm, htn, here with the above complaint.  Melena for one week. No abd pain, no emesis. No bleeding from elsewhere. Mild dyspnea with exertion beyond his baseline, no lightheadeness. No chest pain or palpitations. Doesn't drink alcohol. Denies nsaids. Not on a blood thinner. Not taking ppi.    Review of Systems: As per HPI otherwise 10 point review of systems negative.    Past Medical History:  Diagnosis Date   Diabetes mellitus without complication (HCC)    Hypertension    Peptic ulcer    bleeding ulcer 6 years ago    Past Surgical History:  Procedure Laterality Date   COLONOSCOPY WITH PROPOFOL  N/A 01/21/2018   Procedure: COLONOSCOPY WITH BIOPSIES;  Surgeon: Jinny Carmine, MD;  Location: [facility];  Service: Endoscopy;  Laterality: N/A;   COLONOSCOPY WITH PROPOFOL  N/A 01/11/2019   Procedure: COLONOSCOPY WITH PROPOFOL ;  Surgeon: Jinny Carmine, MD;  Location: [facility];  Service: Endoscopy;  Laterality: N/A;   COLONOSCOPY WITH PROPOFOL  N/A 06/17/2022   Procedure: COLONOSCOPY WITH PROPOFOL ;  Surgeon: Jinny Carmine, MD;  Location: [facility] END[facility];  Service: Endoscopy;  Laterality: N/A;   ESOPHAGOGASTRODUODENOSCOPY (EGD) WITH PROPOFOL  N/A 12/24/2017   Procedure: ESOPHAGOGASTRODUODENOSCOPY (EGD) WITH PROPOFOL ;  Surgeon: Jinny Carmine, MD;  Location: [facility] END[facility];  Service: Endoscopy;  Laterality: N/A;   ESOPHAGOGASTRODUODENOSCOPY (EGD) WITH PROPOFOL  N/A 01/21/2018   Procedure: ESOPHAGOGASTRODUODENOSCOPY (EGD) WITH BIOPSIES;  Surgeon: Jinny Carmine, MD;  Location: [facility];  Service: Endoscopy;  Laterality: N/A;   POLYPECTOMY N/A 01/21/2018   Procedure: POLYPECTOMY;  Surgeon:  Jinny Carmine, MD;  Location: [facility];  Service: Endoscopy;  Laterality: N/A;   THYROIDECTOMY       reports that he has been smoking cigarettes. He has a 40 pack-year smoking history. He has been exposed to tobacco smoke. He has never used smokeless tobacco. He reports that he does not drink alcohol and does not use drugs.  No Known Allergies  Family History  Problem Relation Age of Onset   Diabetes Mother    Hypertension Mother    Diabetes Brother     Prior to Admission medications   Medication Sig Start Date End Date Taking? Authorizing Provider  acetaminophen  (TYLENOL ) 650 MG CR tablet Take 650 mg by mouth every 8 (eight) hours if needed for mild pain Do not crush, chew, or split.    [provider]  atorvastatin (LIPITOR) 40 MG tablet Take 40 mg by mouth daily. 12/17/21   [provider]  cloNIDine  (CATAPRES ) 0.3 MG tablet Take 0.3 mg by mouth 3 (three) times daily. 12/08/22   [provider]  doxazosin  (CARDURA ) 4 MG tablet Take 8 mg by mouth at bedtime. 01/04/20   [provider]  hydrALAZINE  (APRESOLINE ) 100 MG tablet Take 100 mg by mouth 3 (three) times daily. 07/16/22   [provider]  hydrochlorothiazide  (HYDRODIURIL ) 25 MG tablet Take 25 mg by mouth daily. 12/21/22   [provider]  isosorbide  mononitrate (IMDUR ) 60 MG 24 hr tablet Take 1 tablet by mouth daily. 07/21/22   [provider]  labetalol  (NORMODYNE ) 200 MG tablet Take 200 mg by mouth 2 (two) times daily. 04/26/20   [provider]  losartan  (  COZAAR ) 100 MG tablet Take 100 mg by mouth daily. 07/15/21   [provider]  metFORMIN (GLUCOPHAGE) 500 MG tablet Take 1,000 mg by mouth daily with supper. 10/17/22   [provider]  torsemide (DEMADEX) 20 MG tablet Take 20 mg by mouth daily. 10/17/22   [provider]    Physical Exam: Vitals:   10/25/23 1422 10/25/23 1429 10/25/23 1635  BP: 132/76  (!) 140/82  Pulse: (!)  110  (!) 104  Resp: (!) 22  (!) 25  Temp: 98.3 F (36.8 C)    TempSrc: Oral    SpO2: 98%  100%  Weight:  131.5 kg   Height:  5' 10 (1.778 m)     Constitutional: No acute distress Head: Atraumatic Eyes: Conjunctiva clear ENM: Moist mucous membranes. Normal dentition.  Neck: Supple Respiratory: Clear to auscultation bilaterally, no wheezing/rales/rhonchi. Normal respiratory effort. No accessory muscle use. . Cardiovascular: Regular rate and rhythm. No murmurs/rubs/gallops. Abdomen: Non-tender, non-distended. No masses. No rebound or guarding. Positive bowel sounds. Musculoskeletal: No joint deformity upper and lower extremities. Normal ROM, no contractures. Normal muscle tone.  Skin: No rashes, lesions, or ulcers.  Extremities: No peripheral edema. Palpable peripheral pulses. Neurologic: Alert, moving all 4 extremities. Psychiatric: Normal insight and judgement.   Labs on Admission: I have personally reviewed following labs and imaging studies  CBC: Recent Labs  Lab 10/25/23 1434  WBC 15.5*  HGB 7.4*  HCT 24.3*  MCV 84.7  PLT 254   Basic Metabolic Panel: Recent Labs  Lab 10/25/23 1434  NA 138  K 3.9  CL 102  CO2 26  GLUCOSE 204*  BUN 26*  CREATININE 1.44*  CALCIUM 8.2*   GFR: Estimated Creatinine Clearance: 66 mL/min (A) (by C-G formula based on SCr of 1.44 mg/dL (H)). Liver Function Tests: Recent Labs  Lab 10/25/23 1434  AST 19  ALT 22  ALKPHOS 57  BILITOT 0.6  PROT 5.9*  ALBUMIN 3.1*   No results for input(s): LIPASE, AMYLASE in the last 168 hours. No results for input(s): AMMONIA in the last 168 hours. Coagulation Profile: No results for input(s): INR, PROTIME in the last 168 hours. Cardiac Enzymes: No results for input(s): CKTOTAL, CKMB, CKMBINDEX, TROPONINI in the last 168 hours. BNP (last 3 results) No results for input(s): PROBNP in the last 8760 hours. HbA1C: No results for input(s): HGBA1C in the last 72  hours. CBG: No results for input(s): GLUCAP in the last 168 hours. Lipid Profile: No results for input(s): CHOL, HDL, LDLCALC, TRIG, CHOLHDL, LDLDIRECT in the last 72 hours. Thyroid  Function Tests: No results for input(s): TSH, T4TOTAL, FREET4, T3FREE, THYROIDAB in the last 72 hours. Anemia Panel: No results for input(s): VITAMINB12, FOLATE, FERRITIN, TIBC, IRON, RETICCTPCT in the last 72 hours. Urine analysis:    Component Value Date/Time   COLORURINE STRAW (A) 04/27/2020 2117   APPEARANCEUR Clear 06/14/2020 1345   LABSPEC 1.005 04/27/2020 2117   PHURINE 5.0 04/27/2020 2117   GLUCOSEU Negative 06/14/2020 1345   HGBUR NEGATIVE 04/27/2020 2117   BILIRUBINUR Negative 06/14/2020 1345   KETONESUR NEGATIVE 04/27/2020 2117   PROTEINUR 1+ (A) 06/14/2020 1345   PROTEINUR NEGATIVE 04/27/2020 2117   NITRITE Negative 06/14/2020 1345   NITRITE NEGATIVE 04/27/2020 2117   LEUKOCYTESUR Negative 06/14/2020 1345   LEUKOCYTESUR NEGATIVE 04/27/2020 2117    Radiological Exams on Admission: DG Chest 2 View Result Date: 10/25/2023 CLINICAL DATA:  Short of breath EXAM: CHEST - 2 VIEW COMPARISON:  11/16/2022 FINDINGS: Normal mediastinum and  cardiac silhouette. Normal pulmonary vasculature. No evidence of effusion, infiltrate, or pneumothorax. No acute bony abnormality. IMPRESSION: No active cardiopulmonary disease. Electronically Signed   By: Jackquline Boxer M.D.   On: 10/25/2023 15:29    EKG: Independently reviewed. Sinus tach  Assessment/Plan Principal Problem:   GI bleed Active Problems:   PUD (peptic ulcer disease)   CKD stage 3a, GFR 45-59 ml/min (HCC)   # Anemia # History duodenal ulcer Hgb 7.4 from 12s last year. History bleeding duodenal ulcer in 2019. Wasn't maintained on a ppi. No nsaids. Hemodynamically stable. - clears tonight, npo after midnight - ppi IV bid - will alert dr. Jinny - trend hgb, transfuse as needed  # Resistant htn Bp mild  elevation here - resume home atorva, clonidine , doxazosin , hydral, imdur , labetalol , losart - home hydrochlorothiazide  and torsemide on hold for now  # T2DM Glucose appropriate - hold metformin - ssi  # CKD 3a At baseline - trend  # Morbid obesity Noted  DVT prophylaxis: SCDs Code Status: full (confirmed with patient)  Family Communication: none at bedside  Consults called: GI   Level of care: Telemetry Medical Status is: Inpatient Remains inpatient appropriate because: severity of illness    Devaughn KATHEE Ban MD [facility] Pager [phone]  If 7PM-7AM, please contact night-coverage [email or web address] Password [facility]  10/25/2023, 5:37 PM

## 2023-10-26 ENCOUNTER — Encounter
Admission: EM | Disposition: A | Discharge status: Home / Self Care | Payer: Self-pay | Source: Home / Self Care | Attending: Family Medicine

## 2023-10-26 ENCOUNTER — Inpatient Hospital Stay: Payer: Self-pay | Admitting: Certified Registered"

## 2023-10-26 ENCOUNTER — Encounter: Payer: Self-pay | Admitting: Obstetrics and Gynecology

## 2023-10-26 DIAGNOSIS — K259 Gastric ulcer, unspecified as acute or chronic, without hemorrhage or perforation: Secondary | ICD-10-CM

## 2023-10-26 DIAGNOSIS — D649 Anemia, unspecified: Secondary | ICD-10-CM

## 2023-10-26 DIAGNOSIS — K264 Chronic or unspecified duodenal ulcer with hemorrhage: Secondary | ICD-10-CM

## 2023-10-26 DIAGNOSIS — Z8711 Personal history of peptic ulcer disease: Secondary | ICD-10-CM

## 2023-10-26 HISTORY — PX: SUBMUCOSAL INJECTION: SHX5543

## 2023-10-26 HISTORY — PX: ESOPHAGOGASTRODUODENOSCOPY: SHX5428

## 2023-10-26 HISTORY — PX: HEMOSTASIS CLIP PLACEMENT: SHX6857

## 2023-10-26 LAB — BASIC METABOLIC PANEL WITH GFR
Anion gap: 8 (ref 5–15)
BUN: 19 mg/dL (ref 8–23)
CO2: 28 mmol/L (ref 22–32)
Calcium: 8.4 mg/dL — ABNORMAL LOW (ref 8.9–10.3)
Chloride: 103 mmol/L (ref 98–111)
Creatinine, Ser: 1.35 mg/dL — ABNORMAL HIGH (ref 0.61–1.24)
GFR, Estimated: 57 mL/min — ABNORMAL LOW (ref >60)
Glucose, Bld: 124 mg/dL — ABNORMAL HIGH (ref 70–99)
Potassium: 3.9 mmol/L (ref 3.5–5.1)
Sodium: 139 mmol/L (ref 135–145)

## 2023-10-26 LAB — CBC
HCT: 23.9 % — ABNORMAL LOW (ref 39.0–52.0)
Hemoglobin: 7.3 g/dL — ABNORMAL LOW (ref 13.0–17.0)
MCH: 25.8 pg — ABNORMAL LOW (ref 26.0–34.0)
MCHC: 30.5 g/dL (ref 30.0–36.0)
MCV: 84.5 fL (ref 80.0–100.0)
Platelets: 284 K/uL (ref 150–400)
RBC: 2.83 MIL/uL — ABNORMAL LOW (ref 4.22–5.81)
RDW: 18 % — ABNORMAL HIGH (ref 11.5–15.5)
WBC: 13.8 K/uL — ABNORMAL HIGH (ref 4.0–10.5)
nRBC: 0.2 % (ref 0.0–0.2)

## 2023-10-26 LAB — HIV ANTIBODY (ROUTINE TESTING W REFLEX)
HIV Screen 4th Generation wRfx: NONREACTIVE
HIV Screen 4th Generation wRfx: NONREACTIVE

## 2023-10-26 LAB — GLUCOSE, CAPILLARY
Glucose-Capillary: 125 mg/dL — ABNORMAL HIGH (ref 70–99)
Glucose-Capillary: 132 mg/dL — ABNORMAL HIGH (ref 70–99)
Glucose-Capillary: 116 mg/dL — ABNORMAL HIGH (ref 70–99)

## 2023-10-26 SURGERY — EGD (ESOPHAGOGASTRODUODENOSCOPY)
Anesthesia: General

## 2023-10-26 MED ORDER — PROPOFOL 10 MG/ML IV BOLUS
INTRAVENOUS | Status: DC | PRN
  Administered 2023-10-26 (×2): 40 mg via INTRAVENOUS
  Administered 2023-10-26: 60 mg via INTRAVENOUS
  Administered 2023-10-26: 20 mg via INTRAVENOUS
  Administered 2023-10-26: 80 mg via INTRAVENOUS
  Administered 2023-10-26: 30 mg via INTRAVENOUS

## 2023-10-26 MED ORDER — LIDOCAINE HCL (CARDIAC) PF 100 MG/5ML IV SOSY
PREFILLED_SYRINGE | INTRAVENOUS | Status: DC | PRN
  Administered 2023-10-26: 100 mg via INTRAVENOUS

## 2023-10-26 MED ORDER — EPINEPHRINE 1 MG/10ML IJ SOSY
PREFILLED_SYRINGE | INTRAMUSCULAR | Status: AC
  Filled 2023-10-26: qty 10

## 2023-10-26 MED ORDER — SODIUM CHLORIDE 0.9 % IV SOLN: INTRAVENOUS | Status: DC | PRN

## 2023-10-26 NOTE — Hospital Course (Signed)
 68 y.o. M with hx HTN, MO, CKD IIIa baseline 1.3, DM and prior UGIB presented with melena, found to have Hgb 7.4.  Admitted on PPI, GI consulted.

## 2023-10-26 NOTE — Transfer of Care (Signed)
 Immediate Anesthesia Transfer of Care Note  Patient: Mitchell Robbins  Procedure(s) Performed: EGD (ESOPHAGOGASTRODUODENOSCOPY) CONTROL OF HEMORRHAGE, GI TRACT, ENDOSCOPIC, BY CLIPPING OR OVERSEWING  Patient Location: PACU  Anesthesia Type:MAC  Level of Consciousness: drowsy  Airway & Oxygen Therapy: Patient Spontanous Breathing and Patient connected to nasal cannula oxygen  Post-op Assessment: Report given to RN and Post -op Vital signs reviewed and stable  Post vital signs: stable  Last Vitals:  Vitals Value Taken Time  BP 119/67 10/26/23 13:20  Temp    Pulse 85 10/26/23 13:20  Resp 21 10/26/23 13:20  SpO2 90 % 10/26/23 13:20  Vitals shown include unfiled device data.  Last Pain:  Vitals:   10/26/23 1150  TempSrc: Temporal  PainSc: 0-No pain         Complications: No notable events documented.

## 2023-10-26 NOTE — Plan of Care (Signed)

## 2023-10-26 NOTE — Consult Note (Signed)
 [name], MD [facility]  [address] [place], [place] [address] Phone: [phone] Fax : [phone]  Consultation  Referring Provider:     Dr. Kandis Primary Care Physician:  Auston Reyes BIRCH, MD Primary Gastroenterologist:  GI         Reason for Consultation:     Melena with anemia  Date of Admission:  10/25/2023 Date of Consultation:  10/26/2023         HPI:   Mitchell Robbins is a 68 y.o. male who has a history of peptic ulcer disease with procedures back in 2019.  The patient had a colonoscopy in April 2024 for a history of colon polyps. The patient was now admitted with melena for 1 week.  The patient states that he has been having ankle pain and was taking ibuprofen  just prior to this occurring.  He denies drinking alcohol.  There is no report of any abdominal pain.  The patient's last bowel movement of black stools he states was either 2 to 3 days ago.  There is no hematemesis.  He also denies taking any blood thinners. The patient's symptoms included some dyspnea on exertion but no chest pain or lightheadedness.  The patient's labs have shown:  Component     Latest Ref Rng 11/16/2022 10/25/2023 10/26/2023  Hemoglobin     13.0 - 17.0 g/dL 87.2 (L)  7.4 (L)  7.3 (L)   HCT     39.0 - 52.0 % 41.0  24.3 (L)  23.9 (L)    I am now being asked to see the patient for the patient's black stools and anemia.  Past Medical History:  Diagnosis Date   Diabetes mellitus without complication (HCC)    Hypertension    Peptic ulcer    bleeding ulcer 6 years ago    Past Surgical History:  Procedure Laterality Date   COLONOSCOPY WITH PROPOFOL  N/A 01/21/2018   Procedure: COLONOSCOPY WITH BIOPSIES;  Surgeon: Copping Rogelia, MD;  Location: [facility];  Service: Endoscopy;  Laterality: N/A;   COLONOSCOPY WITH PROPOFOL  N/A 01/11/2019   Procedure: COLONOSCOPY WITH PROPOFOL ;  Surgeon: Copping Rogelia, MD;  Location: [facility];  Service: Endoscopy;  Laterality: N/A;    COLONOSCOPY WITH PROPOFOL  N/A 06/17/2022   Procedure: COLONOSCOPY WITH PROPOFOL ;  Surgeon: Copping Rogelia, MD;  Location: [facility];  Service: Endoscopy;  Laterality: N/A;   ESOPHAGOGASTRODUODENOSCOPY (EGD) WITH PROPOFOL  N/A 12/24/2017   Procedure: ESOPHAGOGASTRODUODENOSCOPY (EGD) WITH PROPOFOL ;  Surgeon: Copping Rogelia, MD;  Location: [facility];  Service: Endoscopy;  Laterality: N/A;   ESOPHAGOGASTRODUODENOSCOPY (EGD) WITH PROPOFOL  N/A 01/21/2018   Procedure: ESOPHAGOGASTRODUODENOSCOPY (EGD) WITH BIOPSIES;  Surgeon: Copping Rogelia, MD;  Location: [facility];  Service: Endoscopy;  Laterality: N/A;   POLYPECTOMY N/A 01/21/2018   Procedure: POLYPECTOMY;  Surgeon: Copping Rogelia, MD;  Location: [facility];  Service: Endoscopy;  Laterality: N/A;   THYROIDECTOMY      Prior to Admission medications   Medication Sig Start Date End Date Taking? Authorizing Provider  acetaminophen  (TYLENOL ) 650 MG CR tablet Take 650 mg by mouth every 8 (eight) hours if needed for mild pain Do not crush, chew, or split.   Yes [provider]  cloNIDine  (CATAPRES ) 0.3 MG tablet Take 0.3 mg by mouth 3 (three) times daily. 12/08/22  Yes [provider]  doxazosin  (CARDURA ) 4 MG tablet Take 8 mg by mouth at bedtime. 01/04/20  Yes [provider]  glipiZIDE (GLUCOTROL XL) 10 MG 24 hr  tablet Take 10 mg by mouth daily. 07/29/23  Yes [provider]  hydrALAZINE  (APRESOLINE ) 100 MG tablet Take 100 mg by mouth 3 (three) times daily. 07/16/22  Yes [provider]  hydrochlorothiazide  (HYDRODIURIL ) 25 MG tablet Take 25 mg by mouth daily. 12/21/22  Yes [provider]  isosorbide  mononitrate (IMDUR ) 60 MG 24 hr tablet Take 1 tablet by mouth daily. 07/21/22  Yes [provider]  labetalol  (NORMODYNE ) 200 MG tablet Take 200 mg by mouth 2 (two) times daily. 04/26/20  Yes [provider]  metFORMIN (GLUCOPHAGE) 500 MG tablet Take 1,000 mg by mouth daily  with supper. 10/17/22  Yes [provider]  topiramate  (TOPAMAX ) 50 MG tablet Take 50 mg by mouth 2 (two) times daily. 09/16/23  Yes [provider]    Family History  Problem Relation Age of Onset   Diabetes Mother    Hypertension Mother    Diabetes Brother      Social History   Tobacco Use   Smoking status: Every Day    Current packs/day: 1.00    Average packs/day: 1 pack/day for 40.0 years (40.0 ttl pk-yrs)    Types: Cigarettes    Passive exposure: Current   Smokeless tobacco: Never  Vaping Use   Vaping status: Never Used  Substance Use Topics   Alcohol use: No    Comment: quit drinking alcohol long time ago   Drug use: No    Allergies as of 10/25/2023   (No Known Allergies)    Review of Systems:    All systems reviewed and negative except where noted in HPI.   Physical Exam:  Vital signs in last 24 hours: Temp:  [97.6 F (36.4 C)-98.6 F (37 C)] 97.6 F (36.4 C) (08/18 0355) Pulse Rate:  [79-110] 79 (08/18 0424) Resp:  [18-25] 18 (08/18 0355) BP: (132-174)/(71-103) 152/77 (08/18 0424) SpO2:  [98 %-100 %] 98 % (08/18 0355) Weight:  [131.5 kg] 131.5 kg (08/17 1429) Last BM Date : 10/25/23 General:   Pleasant, cooperative in NAD Head:  Normocephalic and atraumatic. Eyes:   No icterus.   Conjunctiva pink. PERRLA. Ears:  Normal auditory acuity. Neck:  Supple; no masses or thyroidomegaly Lungs: Respirations even and unlabored. Lungs clear to auscultation bilaterally.   No wheezes, crackles, or rhonchi.  Heart:  Regular rate and rhythm;  Without murmur, clicks, rubs or gallops Abdomen:  Soft, nondistended, nontender. Normal bowel sounds. No appreciable masses or hepatomegaly.  No rebound or guarding.  Rectal:  Not performed. Msk:  Symmetrical without gross deformities.    Extremities:  Without edema, cyanosis or clubbing. Neurologic:  Alert and oriented x3;  grossly normal neurologically. Skin:  Intact without significant lesions or  rashes. Cervical Nodes:  No significant cervical adenopathy. Psych:  Alert and cooperative. Normal affect.  LAB RESULTS: Recent Labs    10/25/23 1434 10/26/23 0537  WBC 15.5* 13.8*  HGB 7.4* 7.3*  HCT 24.3* 23.9*  PLT 254 284   BMET Recent Labs    10/25/23 1434 10/26/23 0537  NA 138 139  K 3.9 3.9  CL 102 103  CO2 26 28  GLUCOSE 204* 124*  BUN 26* 19  CREATININE 1.44* 1.35*  CALCIUM 8.2* 8.4*   LFT Recent Labs    10/25/23 1434  PROT 5.9*  ALBUMIN 3.1*  AST 19  ALT 22  ALKPHOS 57  BILITOT 0.6   PT/INR No results for input(s): LABPROT, INR in the last 72 hours.  STUDIES: DG Chest 2 View  Result Date: 10/25/2023 CLINICAL DATA:  Short of breath EXAM: CHEST - 2 VIEW COMPARISON:  11/16/2022 FINDINGS: Normal mediastinum and cardiac silhouette. Normal pulmonary vasculature. No evidence of effusion, infiltrate, or pneumothorax. No acute bony abnormality. IMPRESSION: No active cardiopulmonary disease. Electronically Signed   By: Jackquline Boxer M.D.   On: 10/25/2023 15:29      Impression / Plan:   Assessment: Principal Problem:   GI bleed Active Problems:   PUD (peptic ulcer disease)   CKD stage 3a, GFR 45-59 ml/min (HCC)   Mitchell Robbins is a 68 y.o. y/o male with a history of peptic ulcer disease with melena that occurred for a week prior to coming to the hospital.  The patient has not had any melena in the last few days.  The patient did have a drop in his hemoglobin from 11 months ago.  Plan:  The patient has been n.p.o. overnight.  The patient will be set up for an EGD for today.  The patient has been told that he should not take any more NSAIDs.  He has been explained the risks and benefits and alternatives of the procedure and agrees to proceed with EGD today.  Thank you for involving me in the care of this patient.      LOS: 1 day   [name], MD, MD. NOLIA 10/26/2023, 7:24 AM,  Pager [phone] 7am-5pm  Check AMION for 5pm -7am coverage and  on weekends   Note: This dictation was prepared with Dragon dictation along with smaller phrase technology. Any transcriptional errors that result from this process are unintentional.

## 2023-10-26 NOTE — Op Note (Signed)
 [facility] Patient Name: Mitchell Robbins Procedure Date: 10/26/2023 12:49 PM MRN: [id number] Account #: [id number] Date of Birth: [birth year] Admit Type: Inpatient Age: 68 Room: [facility] ENDO ROOM 4 Gender: Male Note Status: Finalized Instrument Name: Upper GI Scope [id number] Procedure:             Upper GI endoscopy Indications:           Melena Providers:             [name] MD, MD Medicines:             Propofol  per Anesthesia Complications:         No immediate complications. Procedure:             Pre-Anesthesia Assessment:                        - Prior to the procedure, a History and Physical was                         performed, and patient medications and allergies were                         reviewed. The patient's tolerance of previous                         anesthesia was also reviewed. The risks and benefits                         of the procedure and the sedation options and risks                         were discussed with the patient. All questions were                         answered, and informed consent was obtained. Prior                         Anticoagulants: The patient has taken no anticoagulant                         or antiplatelet agents. ASA Grade Assessment: II - A                         patient with mild systemic disease. After reviewing                         the risks and benefits, the patient was deemed in                         satisfactory condition to undergo the procedure.                        After obtaining informed consent, the endoscope was                         passed under direct vision. Throughout the procedure,                         the patient's blood  pressure, pulse, and oxygen                         saturations were monitored continuously. The Endoscope                         was introduced through the mouth, and advanced to the                         second part of duodenum. The upper  GI endoscopy was                         accomplished without difficulty. The patient tolerated                         the procedure well. Findings:      The Z-line was irregular.      Multiple erosions with no bleeding and no stigmata of recent bleeding       were found in the gastric antrum.      One oozing cratered duodenal ulcer with an adherent clot (Forrest Class       IIb) was found in the second portion of the duodenum. Area was       successfully injected with 4 mL of a 0.1 mg/mL solution of epinephrine        for hemostasis. To stop active bleeding, three hemostatic clips were       successfully placed (MR conditional). Clip manufacturer: [employer]. There was no bleeding at the end of the procedure. Purastat       was placed in the ulcer. Impression:            - Z-line irregular.                        - Gastric erosions with no bleeding and no stigmata of                         recent bleeding.                        - Oozing duodenal ulcer with an adherent clot (Forrest                         Class IIb). Injected. Clips (MR conditional) were                         placed. Clip manufacturer: [employer].                        - No specimens collected. Recommendation:        - Return patient to hospital ward for ongoing care.                        - Clear liquid diet.                        - Continue present medications.                        - No ibuprofen , naproxen, or other non-steroidal  anti-inflammatory drugs.                        - If any further bleeding then I would recommend                         vascular surgery for embolization. Procedure Code(s):     --- Professional ---                        [phone], Esophagogastroduodenoscopy, flexible,                         transoral; with control of bleeding, any method Diagnosis Code(s):     --- Professional ---                        K92.1, Melena (includes  Hematochezia)                        K26.4, Chronic or unspecified duodenal ulcer with                         hemorrhage CPT copyright 2022 American Medical Association. All rights reserved. The codes documented in this report are preliminary and upon coder review may  be revised to meet current compliance requirements. [name] MD, MD 10/26/2023 1:25:43 PM This report has been signed electronically. Number of Addenda: 0 Note Initiated On: 10/26/2023 12:49 PM Estimated Blood Loss:  Estimated blood loss: none.      [facility]

## 2023-10-26 NOTE — Anesthesia Preprocedure Evaluation (Signed)
 Anesthesia Evaluation  Patient identified by MRN, date of birth, ID band Patient awake    Reviewed: Allergy & Precautions, H&P , NPO status , Patient's Chart, lab work & pertinent test results  History of Anesthesia Complications Negative for: history of anesthetic complications  Airway Mallampati: III  TM Distance: >3 FB Neck ROM: full   Comment: Large head and jaw Dental  (+) Missing, Chipped, Poor Dentition, Dental Advidsory Given   Pulmonary shortness of breath and with exertion, sleep apnea , neg COPD, neg recent URI, Current Smoker and Patient abstained from smoking.   breath sounds clear to auscultation       Cardiovascular Exercise Tolerance: Poor hypertension, + angina (Pt does not clearly articulate when he has chest pain, but states it does not occur often and sometimes occurs with certain positions)  (-) Past MI and (-) Cardiac Stents + dysrhythmias Atrial Fibrillation (-) Valvular Problems/Murmurs Rhythm:regular     Neuro/Psych negative neurological ROS  negative psych ROS   GI/Hepatic Neg liver ROS, PUD,GERD  ,,  Endo/Other  diabetes, Type 2    Renal/GU Renal disease (CKD stage 3a)  negative genitourinary   Musculoskeletal   Abdominal  (+) + obese  Peds  Hematology  (+) Blood dyscrasia, anemia   Anesthesia Other Findings Past Medical History: No date: Hypertension   Reproductive/Obstetrics negative OB ROS                              Anesthesia Physical Anesthesia Plan  ASA: 3  Anesthesia Plan: General   Post-op Pain Management:    Induction: Intravenous  PONV Risk Score and Plan: 1 and Propofol  infusion and TIVA  Airway Management Planned: Natural Airway and Nasal Cannula  Additional Equipment:   Intra-op Plan:   Post-operative Plan:   Informed Consent: I have reviewed the patients History and Physical, chart, labs and discussed the procedure including the  risks, benefits and alternatives for the proposed anesthesia with the patient or authorized representative who has indicated his/her understanding and acceptance.     Dental Advisory Given  Plan Discussed with: Anesthesiologist, CRNA and Surgeon  Anesthesia Plan Comments:          Anesthesia Quick Evaluation

## 2023-10-26 NOTE — Progress Notes (Signed)
  Progress Note   Patient: Mitchell Robbins FMW:[id number] DOB: Jun 15, 1955 DOA: 10/25/2023     1 DOS: the patient was seen and examined on 10/26/2023 at 9:02AM      Brief hospital course: 68 y.o. M with hx HTN, MO, CKD IIIa baseline 1.3, DM and prior UGIB presented with melena, found to have Hgb 7.4.  Admitted on PPI, GI consulted.     Assessment and Plan: Acute upper GI bleed Acute blood loss anemia Duodenal ulcer Patient was admitted on IV PPI Went for EGD today with Dr. Jinny that showed a duodenal ulcer, [facility] IIb that was injected and clipped. - Clear liquid diet - Avoid NSAIDs - Continue PPI - Monitor hemoglobin -Hemoglobin transfusion threshold 7 g/dL  Hypertension Blood pressure controlled - Continue clonidine , doxazosin , hydralazine , Imdur , labetalol  - Hold home HCTZ  Diabetes Glucose controlled - Hold metformin - Continue SS corrections  Chronic kidney disease stage IIIa Creatinine stable relative to baseline 1.3  Morbid obesity Class III obesity, BMI 41.6, complicates care       Subjective: Patient's had no bowel movements overnight, no further melena, no vomiting.  No dizziness.     Physical Exam: BP 121/68 (BP Location: Right Arm)   Pulse 82   Temp 97.8 F (36.6 C)   Resp 19   Ht 5' 10 (1.778 m)   Wt 131.5 kg   SpO2 96%   BMI 41.61 kg/m   Adult male, sitting up in bed, interactive and appropriate RRR, no murmurs, no peripheral edema Respiratory rate normal, lungs clear without rales or wheezes Abdomen soft, no tenderness palpation or guarding, no ascites or distention Attention normal, affect normal, judgment and insight appear normal    Data Reviewed: Basic metabolic panel shows stable renal function CBC shows improving leukocytosis, stable hemoglobin 7.3 EGD report reviewed     Family Communication:     Disposition: Status is: Inpatient 68 year old man with prior history GI bleed, presents with recurrent duodenal  ulcer  This is Forrest 2, injected with epinephrine  and clipped today.  Given the severity of bleeding, he will need to be monitored overnight, if stable and able to tolerate advancement diet tomorrow, possibly home tomorrow afternoon        Author: Lonni SHAUNNA Dalton, MD 10/26/2023 4:47 PM  For on call review [email or web address].

## 2023-10-26 NOTE — Anesthesia Postprocedure Evaluation (Signed)
 Anesthesia Post Note  Patient: Mitchell Robbins  Procedure(s) Performed: EGD (ESOPHAGOGASTRODUODENOSCOPY) CONTROL OF HEMORRHAGE, GI TRACT, ENDOSCOPIC, BY CLIPPING OR OVERSEWING INJECTION, SUBMUCOSAL  Patient location during evaluation: PACU Anesthesia Type: General Level of consciousness: awake and alert Pain management: pain level controlled Vital Signs Assessment: post-procedure vital signs reviewed and stable Respiratory status: spontaneous breathing, nonlabored ventilation and respiratory function stable Cardiovascular status: blood pressure returned to baseline and stable Postop Assessment: no apparent nausea or vomiting Anesthetic complications: no   No notable events documented.   Last Vitals:  Vitals:   10/26/23 1340 10/26/23 1400  BP: 122/67 138/85  Pulse: 81 82  Resp: (!) 22 20  Temp:  (!) 36.3 C  SpO2: 97% 96%    Last Pain:  Vitals:   10/26/23 1400  TempSrc: Oral  PainSc:                  Camellia Merilee Louder

## 2023-10-27 DIAGNOSIS — K921 Melena: Secondary | ICD-10-CM

## 2023-10-27 DIAGNOSIS — K269 Duodenal ulcer, unspecified as acute or chronic, without hemorrhage or perforation: Secondary | ICD-10-CM

## 2023-10-27 DIAGNOSIS — D649 Anemia, unspecified: Secondary | ICD-10-CM

## 2023-10-27 DIAGNOSIS — N1831 Chronic kidney disease, stage 3a: Secondary | ICD-10-CM

## 2023-10-27 LAB — GLUCOSE, CAPILLARY
Glucose-Capillary: 128 mg/dL — ABNORMAL HIGH (ref 70–99)
Glucose-Capillary: 105 mg/dL — ABNORMAL HIGH (ref 70–99)
Glucose-Capillary: 108 mg/dL — ABNORMAL HIGH (ref 70–99)

## 2023-10-27 LAB — CBC
HCT: 22.1 % — ABNORMAL LOW (ref 39.0–52.0)
Hemoglobin: 6.9 g/dL — ABNORMAL LOW (ref 13.0–17.0)
MCH: 25.9 pg — ABNORMAL LOW (ref 26.0–34.0)
MCHC: 31.2 g/dL (ref 30.0–36.0)
MCV: 83.1 fL (ref 80.0–100.0)
Platelets: 295 K/uL (ref 150–400)
RBC: 2.66 MIL/uL — ABNORMAL LOW (ref 4.22–5.81)
RDW: 17.9 % — ABNORMAL HIGH (ref 11.5–15.5)
WBC: 12.1 K/uL — ABNORMAL HIGH (ref 4.0–10.5)
nRBC: 0 % (ref 0.0–0.2)

## 2023-10-27 LAB — BASIC METABOLIC PANEL WITH GFR
Anion gap: 6 (ref 5–15)
BUN: 16 mg/dL (ref 8–23)
CO2: 27 mmol/L (ref 22–32)
Calcium: 8.3 mg/dL — ABNORMAL LOW (ref 8.9–10.3)
Chloride: 103 mmol/L (ref 98–111)
Creatinine, Ser: 1.37 mg/dL — ABNORMAL HIGH (ref 0.61–1.24)
GFR, Estimated: 56 mL/min — ABNORMAL LOW (ref >60)
Glucose, Bld: 128 mg/dL — ABNORMAL HIGH (ref 70–99)
Potassium: 3.8 mmol/L (ref 3.5–5.1)
Sodium: 136 mmol/L (ref 135–145)

## 2023-10-27 LAB — HEMOGLOBIN A1C
Hgb A1c MFr Bld: 6.3 % — ABNORMAL HIGH (ref 4.8–5.6)
Mean Plasma Glucose: 134 mg/dL

## 2023-10-27 LAB — HEMOGLOBIN: Hemoglobin: 7.6 g/dL — ABNORMAL LOW (ref 13.0–17.0)

## 2023-10-27 LAB — PREPARE RBC (CROSSMATCH)

## 2023-10-27 MED ORDER — SALINE SPRAY 0.65 % NA SOLN
1.0000 | NASAL | Status: DC | PRN
  Filled 2023-10-27: qty 44

## 2023-10-27 MED ORDER — PANTOPRAZOLE SODIUM 40 MG PO TBEC: 40.0000 mg | DELAYED_RELEASE_TABLET | Freq: Two times a day (BID) | ORAL | 1 refills | Status: AC

## 2023-10-27 MED ORDER — TOPIRAMATE 25 MG PO TABS
50.0000 mg | ORAL_TABLET | Freq: Two times a day (BID) | ORAL | Status: DC
  Administered 2023-10-27: 50 mg via ORAL
  Filled 2023-10-27 (×2): qty 2

## 2023-10-27 MED ORDER — ORAL CARE MOUTH RINSE: 15.0000 mL | OROMUCOSAL | Status: DC | PRN

## 2023-10-27 MED ORDER — SODIUM CHLORIDE 0.9% IV SOLUTION: Freq: Once | INTRAVENOUS | Status: AC

## 2023-10-27 NOTE — Discharge Instructions (Signed)
 Avoid NSAIDS like medication

## 2023-10-27 NOTE — Discharge Summary (Signed)
 Physician Discharge Summary   Patient: Mitchell Robbins MRN: [id number] DOB: 1955/11/21  Admit date:     10/25/2023  Discharge date: 10/27/23  Discharge Physician: Leita Blanch   PCP: Auston Reyes BIRCH, MD   Recommendations at discharge:    F/u PCP In 1-2 weeks Avoid NSAIDS like meds F/u Dr Jinny in 2-3 weeks  Discharge Diagnoses: Principal Problem:   GI bleed Active Problems:   PUD (peptic ulcer disease)   CKD stage 3a, GFR 45-59 ml/min (HCC)   Bleeding duodenal ulcer   Duodenal ulcer   68 y.o. M with hx HTN, MO, CKD IIIa baseline 1.3, DM and prior UGIB presented with melena, found to have Hgb 7.4.  Admitted on PPI, GI consulted.    Acute upper GI bleed/Acute blood loss anemia due to Duodenal ulcer (on EGD) --Patient was admitted on IV PPI --Went for EGD today with Dr. Jinny that showed a duodenal ulcer, [facility] IIb that was injected and clipped. - Clear liquid diet--to soft diet - Avoid NSAIDs - Continue PPI - Monitor hemoglobin -Hemoglobin transfusion threshold 7 g/dL.. hgb 6.9--1 unit BT --hgb 7.6 --change to po PPI   Hypertension Blood pressure controlled - Continue clonidine , doxazosin , hydralazine , Imdur , labetalol    Diabetes Glucose controlled - resumed metformin - Continue SS corrections   Chronic kidney disease stage IIIa Creatinine stable relative to baseline 1.3   Morbid obesity Class III obesity, BMI 41.6, complicates care    Overall hemodynamically stable. Per G.I. workup completed. Patient is agreeable to go home. No family at bedside.      Consultants: G.I. Procedures performed: EGD Disposition: home Diet recommendation:   DISCHARGE MEDICATION: Allergies as of 10/27/2023   No Known Allergies      Medication List     TAKE these medications    acetaminophen  650 MG CR tablet Commonly known as: TYLENOL  Take 650 mg by mouth every 8 (eight) hours if needed for mild pain Do not crush, chew, or split.   cloNIDine  0.3 MG tablet Commonly  known as: CATAPRES  Take 0.3 mg by mouth 3 (three) times daily.   doxazosin  4 MG tablet Commonly known as: CARDURA  Take 8 mg by mouth at bedtime.   glipiZIDE 10 MG 24 hr tablet Commonly known as: GLUCOTROL XL Take 10 mg by mouth daily.   hydrALAZINE  100 MG tablet Commonly known as: APRESOLINE  Take 100 mg by mouth 3 (three) times daily.   hydrochlorothiazide  25 MG tablet Commonly known as: HYDRODIURIL  Take 25 mg by mouth daily.   isosorbide  mononitrate 60 MG 24 hr tablet Commonly known as: IMDUR  Take 1 tablet by mouth daily.   labetalol  200 MG tablet Commonly known as: NORMODYNE  Take 200 mg by mouth 2 (two) times daily.   metFORMIN 500 MG tablet Commonly known as: GLUCOPHAGE Take 1,000 mg by mouth daily with supper.   pantoprazole  40 MG tablet Commonly known as: Protonix  Take 1 tablet (40 mg total) by mouth 2 (two) times daily before a meal.   topiramate  50 MG tablet Commonly known as: TOPAMAX  Take 50 mg by mouth 2 (two) times daily.        Follow-up Information     Auston Reyes BIRCH, MD. Schedule an appointment as soon as possible for a visit in 1 week(s).   Specialty: Internal Medicine Contact information: [address] [facility] [place] [address] [phone]         Jinny Carmine, MD. Schedule an appointment as soon as possible for a visit in 3  week(s).   Specialty: Gastroenterology Why: Gastric ulcer f/u Contact information: [address] [place]  [place] [address] [phone]                Discharge Exam: Fredricka Weights   10/25/23 1429  Weight: 131.5 kg   Alert and oriented times three respiratory clear to auscultation cardiovascular both heart sounds normal neuro- nonfocal  Condition at discharge: fair  The results of significant diagnostics from this hospitalization (including imaging, microbiology, ancillary and laboratory) are listed below for reference.   Imaging Studies: DG Chest 2 View Result Date:  10/25/2023 CLINICAL DATA:  Short of breath EXAM: CHEST - 2 VIEW COMPARISON:  11/16/2022 FINDINGS: Normal mediastinum and cardiac silhouette. Normal pulmonary vasculature. No evidence of effusion, infiltrate, or pneumothorax. No acute bony abnormality. IMPRESSION: No active cardiopulmonary disease. Electronically Signed   By: Jackquline Boxer M.D.   On: 10/25/2023 15:29   US  Venous Img Lower Unilateral Left Result Date: 10/14/2023 CLINICAL DATA:  Left leg pain EXAM: Left LOWER EXTREMITY VENOUS DOPPLER ULTRASOUND TECHNIQUE: Gray-scale sonography with compression, as well as color and duplex ultrasound, were performed to evaluate the deep venous system(s) from the level of the common femoral vein through the popliteal and proximal calf veins. COMPARISON:  07/04/2022 FINDINGS: VENOUS Normal compressibility of the common femoral, superficial femoral, and popliteal veins, as well as the visualized calf veins. Visualized portions of profunda femoral vein and great saphenous vein unremarkable. No filling defects to suggest DVT on grayscale or color Doppler imaging. Doppler waveforms show normal direction of venous flow, normal respiratory plasticity and response to augmentation. Limited views of the contralateral common femoral vein are unremarkable. OTHER None. Limitations: none IMPRESSION: Negative. Electronically Signed   By: Luke Bun M.D.   On: 10/14/2023 02:47   DG Femur Min 2 Views Left Result Date: 10/13/2023 CLINICAL DATA:  [id number]. Pain in the left thigh times several months, recently worsening. No known injury. EXAM: LEFT FEMUR 2 VIEWS; PELVIS - 1-2 VIEW COMPARISON:  Abdomen film 12/31/2022, left femoral MRI 07/04/2022. FINDINGS: AP pelvis 1 view: There is no evidence of fracture or other focal bone lesions. Soft tissues are unremarkable. There are enthesopathic changes of the bony pelvis. 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 Advanced degenerative change lower lumbar spine. The hip  joints are unremarkable. AP Lat two views of the right femur obtained in 4 films: There is no evidence of fracture or other focal bone lesions. The hip joint and the knee joint are unremarkable. There is a small ossicle adjacent the upper medial aspect of the medial femoral condyle which could be due to a dystrophic calcification such as a remote MCL injury. Also noted is patchy calcification in the superficial femoral artery. Soft tissues are otherwise unremarkable. IMPRESSION: 1. No evidence of fracture or other focal bone lesions in the pelvis or left femur. 2. 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 3. Advanced degenerative change lower lumbar spine. 4. Small ossicle adjacent the upper medial aspect of the medial femoral condyle which could be due to a dystrophic calcification such as a remote MCL injury. Electronically Signed   By: Francis Quam M.D.   On: 10/13/2023 23:55   DG Pelvis 1-2 Views Result Date: 10/13/2023 CLINICAL DATA:  [id number]. Pain in the left thigh times several months, recently worsening. No known injury. EXAM: LEFT FEMUR 2 VIEWS; PELVIS - 1-2 VIEW COMPARISON:  Abdomen film 12/31/2022, left femoral MRI 07/04/2022. FINDINGS: AP pelvis 1  view: There is no evidence of fracture or other focal bone lesions. Soft tissues are unremarkable. There are enthesopathic changes of the bony pelvis. 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 Advanced degenerative change lower lumbar spine. The hip joints are unremarkable. AP Lat two views of the right femur obtained in 4 films: There is no evidence of fracture or other focal bone lesions. The hip joint and the knee joint are unremarkable. There is a small ossicle adjacent the upper medial aspect of the medial femoral condyle which could be due to a dystrophic calcification such as a remote MCL injury. Also noted is patchy calcification in the superficial femoral artery. Soft tissues are otherwise unremarkable. IMPRESSION: 1. No  evidence of fracture or other focal bone lesions in the pelvis or left femur. 2. 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 3. Advanced degenerative change lower lumbar spine. 4. Small ossicle adjacent the upper medial aspect of the medial femoral condyle which could be due to a dystrophic calcification such as a remote MCL injury. Electronically Signed   By: Francis Quam M.D.   On: 10/13/2023 23:55    Microbiology: Results for orders placed or performed during the hospital encounter of 11/16/22  Resp panel by RT-PCR (RSV, Flu A&B, Covid) Anterior Nasal Swab     Status: None   Collection Time: 11/17/22 12:04 AM   Specimen: Anterior Nasal Swab  Result Value Ref Range Status   SARS Coronavirus 2 by RT PCR NEGATIVE NEGATIVE Final    Comment: (NOTE) SARS-CoV-2 target nucleic acids are NOT DETECTED.  The SARS-CoV-2 RNA is generally detectable in upper respiratory specimens during the acute phase of infection. The lowest concentration of SARS-CoV-2 viral copies this assay can detect is 138 copies/mL. A negative result does not preclude SARS-Cov-2 infection and should not be used as the sole basis for treatment or other patient management decisions. A negative result may occur with  improper specimen collection/handling, submission of specimen other than nasopharyngeal swab, presence of viral mutation(s) within the areas targeted by this assay, and inadequate number of viral copies(<138 copies/mL). A negative result must be combined with clinical observations, patient history, and epidemiological information. The expected result is Negative.  Fact Sheet for Patients:  BloggerCourse.com  Fact Sheet for Healthcare Providers:  SeriousBroker.it  This test is no t yet approved or cleared by the [place]  FDA and  has been authorized for detection and/or diagnosis of SARS-CoV-2 by FDA under an Emergency Use Authorization (EUA). This  EUA will remain  in effect (meaning this test can be used) for the duration of the COVID-19 declaration under Section 564(b)(1) of the Act, 21 U.S.C.section 360bbb-3(b)(1), unless the authorization is terminated  or revoked sooner.       Influenza A by PCR NEGATIVE NEGATIVE Final   Influenza B by PCR NEGATIVE NEGATIVE Final    Comment: (NOTE) The Xpert Xpress SARS-CoV-2/FLU/RSV plus assay is intended as an aid in the diagnosis of influenza from Nasopharyngeal swab specimens and should not be used as a sole basis for treatment. Nasal washings and aspirates are unacceptable for Xpert Xpress SARS-CoV-2/FLU/RSV testing.  Fact Sheet for Patients: BloggerCourse.com  Fact Sheet for Healthcare Providers: SeriousBroker.it  This test is not yet approved or cleared by the [place]  FDA and has been authorized for detection and/or diagnosis of SARS-CoV-2 by FDA under an Emergency Use Authorization (EUA). This EUA will remain in effect (meaning this test can be used) for the duration  of the COVID-19 declaration under Section 564(b)(1) of the Act, 21 U.S.C. section 360bbb-3(b)(1), unless the authorization is terminated or revoked.     Resp Syncytial Virus by PCR NEGATIVE NEGATIVE Final    Comment: (NOTE) Fact Sheet for Patients: BloggerCourse.com  Fact Sheet for Healthcare Providers: SeriousBroker.it  This test is not yet approved or cleared by the [place]  FDA and has been authorized for detection and/or diagnosis of SARS-CoV-2 by FDA under an Emergency Use Authorization (EUA). This EUA will remain in effect (meaning this test can be used) for the duration of the COVID-19 declaration under Section 564(b)(1) of the Act, 21 U.S.C. section 360bbb-3(b)(1), unless the authorization is terminated or revoked.  Performed at [facility], [address] [place], [place]  [address]     Labs: CBC: Recent Labs  Lab 10/25/23 1434 10/26/23 0537 10/27/23 0508 10/27/23 1247  WBC 15.5* 13.8* 12.1*  --   HGB 7.4* 7.3* 6.9* 7.6*  HCT 24.3* 23.9* 22.1*  --   MCV 84.7 84.5 83.1  --   PLT 254 284 295  --    Basic Metabolic Panel: Recent Labs  Lab 10/25/23 1434 10/26/23 0537 10/27/23 0508  NA 138 139 136  K 3.9 3.9 3.8  CL 102 103 103  CO2 26 28 27   GLUCOSE 204* 124* 128*  BUN 26* 19 16  CREATININE 1.44* 1.35* 1.37*  CALCIUM 8.2* 8.4* 8.3*   Liver Function Tests: Recent Labs  Lab 10/25/23 1434  AST 19  ALT 22  ALKPHOS 57  BILITOT 0.6  PROT 5.9*  ALBUMIN 3.1*   CBG: Recent Labs  Lab 10/26/23 0822 10/26/23 1635 10/26/23 2045 10/27/23 0837 10/27/23 1147  GLUCAP 125* 132* 116* 128* 105*    Discharge time spent: greater than 30 minutes.  Signed: Leita Blanch, MD [facility] 10/27/2023

## 2023-10-27 NOTE — Progress Notes (Signed)
    Mitchell Copping, MD [facility]   [address], [address] [place], [place] [address] Phone: [phone] Fax : [phone]   Subjective: The patient had 2 black stools since the procedure.  The patient had a large duodenal ulcer with a large crater with a visible vessel that was treated with epinephrine  then was cauterized in addition to 3 clips being placed over the lesion and then hemostatic glue was used over the lesion.  The patient's hemoglobin has trended down since yesterday.   Objective: Vital signs in last 24 hours: Vitals:   10/27/23 0916 10/27/23 0937 10/27/23 1000 10/27/23 1203  BP: 118/71 118/74 125/73 126/89  Pulse: 80 80 76 76  Resp: 18 18 18 18   Temp: 98 F (36.7 C) 97.9 F (36.6 C) 97.6 F (36.4 C) 98 F (36.7 C)  TempSrc:      SpO2: 97% 98%  99%  Weight:      Height:       Weight change:   Intake/Output Summary (Last 24 hours) at 10/27/2023 1302 Last data filed at 10/27/2023 1200 Gross per 24 hour  Intake 698.67 ml  Output --  Net 698.67 ml     Exam: Heart:: Regular rate and rhythm or without murmur or extra heart sounds Lungs: normal and clear to auscultation and percussion Abdomen: soft, nontender, normal bowel sounds   Lab Results: @LABTEST2 @ Micro Results: No results found for this or any previous visit (from the past 240 hours). Studies/Results: DG Chest 2 View Result Date: 10/25/2023 CLINICAL DATA:  Short of breath EXAM: CHEST - 2 VIEW COMPARISON:  11/16/2022 FINDINGS: Normal mediastinum and cardiac silhouette. Normal pulmonary vasculature. No evidence of effusion, infiltrate, or pneumothorax. No acute bony abnormality. IMPRESSION: No active cardiopulmonary disease. Electronically Signed   By: Jackquline Boxer M.D.   On: 10/25/2023 15:29   Medications: I have reviewed the patient's current medications. Scheduled Meds:  cloNIDine   0.3 mg Oral TID   doxazosin   8 mg Oral QHS   hydrALAZINE   100 mg Oral TID   insulin  aspart  0-15 Units Subcutaneous  TID WC   insulin  aspart  0-5 Units Subcutaneous QHS   isosorbide  mononitrate  60 mg Oral Daily   labetalol   200 mg Oral BID   pantoprazole  (PROTONIX ) IV  40 mg Intravenous Q12H   sodium chloride  flush  3 mL Intravenous Q12H   topiramate   50 mg Oral BID   Continuous Infusions: PRN Meds:.mouth rinse, sodium chloride , sodium chloride  flush   Assessment: Principal Problem:   GI bleed Active Problems:   PUD (peptic ulcer disease)   CKD stage 3a, GFR 45-59 ml/min (HCC)   Bleeding duodenal ulcer    Plan:  The patient had a drop in hemoglobin with a ulcer with active bleeding seen yesterday that was treated with multiple modalities.  The patient had 2 black stools.  It was recommended that if the patient has any further continued bleeding that vascular surgery should be consulted for possible embolization.  With the multiple modalities and use yes it is unlikely that further endoscopies would be beneficial in this patient.   LOS: 2 days   Mitchell Copping, MD.FACG 10/27/2023, 1:02 PM Pager [phone] 7am-5pm  Check AMION for 5pm -7am coverage and on weekends

## 2023-10-28 LAB — TYPE AND SCREEN
ABO/RH(D): A POS
Antibody Screen: NEGATIVE
Unit division: 0

## 2023-10-28 LAB — BPAM RBC
Blood Product Expiration Date: 202509172359
ISSUE DATE / TIME: 202508190940
Unit Type and Rh: 6200

## 2023-11-01 ENCOUNTER — Encounter: Payer: Self-pay | Admitting: Emergency Medicine

## 2023-11-01 ENCOUNTER — Emergency Department: Payer: Self-pay

## 2023-11-01 ENCOUNTER — Observation Stay
Admission: EM | Admit: 2023-11-01 | Discharge: 2023-11-04 | Disposition: A | Discharge status: Home / Self Care | Payer: Self-pay | Attending: Internal Medicine | Admitting: Internal Medicine

## 2023-11-01 ENCOUNTER — Other Ambulatory Visit: Payer: Self-pay

## 2023-11-01 DIAGNOSIS — N189 Chronic kidney disease, unspecified: Secondary | ICD-10-CM | POA: Insufficient documentation

## 2023-11-01 DIAGNOSIS — E1122 Type 2 diabetes mellitus with diabetic chronic kidney disease: Secondary | ICD-10-CM | POA: Insufficient documentation

## 2023-11-01 DIAGNOSIS — K922 Gastrointestinal hemorrhage, unspecified: Principal | ICD-10-CM | POA: Diagnosis present

## 2023-11-01 DIAGNOSIS — F17210 Nicotine dependence, cigarettes, uncomplicated: Secondary | ICD-10-CM | POA: Insufficient documentation

## 2023-11-01 DIAGNOSIS — I10 Essential (primary) hypertension: Secondary | ICD-10-CM | POA: Insufficient documentation

## 2023-11-01 DIAGNOSIS — D649 Anemia, unspecified: Secondary | ICD-10-CM | POA: Insufficient documentation

## 2023-11-01 DIAGNOSIS — K921 Melena: Secondary | ICD-10-CM | POA: Insufficient documentation

## 2023-11-01 LAB — CBC
HCT: 22.1 % — ABNORMAL LOW (ref 39.0–52.0)
Hemoglobin: 6.6 g/dL — ABNORMAL LOW (ref 13.0–17.0)
MCH: 25.6 pg — ABNORMAL LOW (ref 26.0–34.0)
MCHC: 29.9 g/dL — ABNORMAL LOW (ref 30.0–36.0)
MCV: 85.7 fL (ref 80.0–100.0)
Platelets: 365 K/uL (ref 150–400)
RBC: 2.58 MIL/uL — ABNORMAL LOW (ref 4.22–5.81)
RDW: 17.4 % — ABNORMAL HIGH (ref 11.5–15.5)
WBC: 13.1 K/uL — ABNORMAL HIGH (ref 4.0–10.5)
nRBC: 0.2 % (ref 0.0–0.2)

## 2023-11-01 LAB — BASIC METABOLIC PANEL WITH GFR
Anion gap: 9 (ref 5–15)
BUN: 19 mg/dL (ref 8–23)
CO2: 26 mmol/L (ref 22–32)
Calcium: 8.2 mg/dL — ABNORMAL LOW (ref 8.9–10.3)
Chloride: 102 mmol/L (ref 98–111)
Creatinine, Ser: 1.69 mg/dL — ABNORMAL HIGH (ref 0.61–1.24)
GFR, Estimated: 43 mL/min — ABNORMAL LOW (ref >60)
Glucose, Bld: 171 mg/dL — ABNORMAL HIGH (ref 70–99)
Potassium: 3.7 mmol/L (ref 3.5–5.1)
Sodium: 137 mmol/L (ref 135–145)

## 2023-11-01 LAB — TROPONIN I (HIGH SENSITIVITY): Troponin I (High Sensitivity): 16 ng/L (ref <18)

## 2023-11-01 NOTE — ED Triage Notes (Signed)
 Patient reports intermittent heart palpations and centralized chest pain for 2-3 days. He describes the pain as burning and denies radiation. The pain is exertional and goes away with rest. Reports shortness of breath when the pain and palpitations occur.

## 2023-11-02 ENCOUNTER — Observation Stay: Payer: Self-pay | Admitting: Anesthesiology

## 2023-11-02 ENCOUNTER — Encounter
Admission: EM | Disposition: A | Discharge status: Home / Self Care | Payer: Self-pay | Source: Home / Self Care | Attending: Emergency Medicine

## 2023-11-02 ENCOUNTER — Encounter: Payer: Self-pay | Admitting: Family Medicine

## 2023-11-02 DIAGNOSIS — K921 Melena: Secondary | ICD-10-CM

## 2023-11-02 DIAGNOSIS — K922 Gastrointestinal hemorrhage, unspecified: Secondary | ICD-10-CM

## 2023-11-02 DIAGNOSIS — K269 Duodenal ulcer, unspecified as acute or chronic, without hemorrhage or perforation: Secondary | ICD-10-CM

## 2023-11-02 DIAGNOSIS — D649 Anemia, unspecified: Secondary | ICD-10-CM

## 2023-11-02 DIAGNOSIS — F17210 Nicotine dependence, cigarettes, uncomplicated: Secondary | ICD-10-CM

## 2023-11-02 DIAGNOSIS — Z7984 Long term (current) use of oral hypoglycemic drugs: Secondary | ICD-10-CM

## 2023-11-02 DIAGNOSIS — K263 Acute duodenal ulcer without hemorrhage or perforation: Secondary | ICD-10-CM

## 2023-11-02 DIAGNOSIS — I10 Essential (primary) hypertension: Secondary | ICD-10-CM | POA: Insufficient documentation

## 2023-11-02 DIAGNOSIS — Z9889 Other specified postprocedural states: Secondary | ICD-10-CM

## 2023-11-02 DIAGNOSIS — E1122 Type 2 diabetes mellitus with diabetic chronic kidney disease: Secondary | ICD-10-CM | POA: Insufficient documentation

## 2023-11-02 DIAGNOSIS — Z79899 Other long term (current) drug therapy: Secondary | ICD-10-CM

## 2023-11-02 HISTORY — PX: ESOPHAGOGASTRODUODENOSCOPY: SHX5428

## 2023-11-02 LAB — IRON AND TIBC
Iron: 42 ug/dL — ABNORMAL LOW (ref 45–182)
Saturation Ratios: 10 % — ABNORMAL LOW (ref 17.9–39.5)
TIBC: 433 ug/dL (ref 250–450)
UIBC: 391 ug/dL

## 2023-11-02 LAB — BASIC METABOLIC PANEL WITH GFR
Anion gap: 11 (ref 5–15)
BUN: 16 mg/dL (ref 8–23)
CO2: 26 mmol/L (ref 22–32)
Calcium: 8.4 mg/dL — ABNORMAL LOW (ref 8.9–10.3)
Chloride: 102 mmol/L (ref 98–111)
Creatinine, Ser: 1.49 mg/dL — ABNORMAL HIGH (ref 0.61–1.24)
GFR, Estimated: 50 mL/min — ABNORMAL LOW (ref >60)
Glucose, Bld: 113 mg/dL — ABNORMAL HIGH (ref 70–99)
Potassium: 3.7 mmol/L (ref 3.5–5.1)
Sodium: 139 mmol/L (ref 135–145)

## 2023-11-02 LAB — CBC
HCT: 24.7 % — ABNORMAL LOW (ref 39.0–52.0)
Hemoglobin: 7.5 g/dL — ABNORMAL LOW (ref 13.0–17.0)
MCH: 25.6 pg — ABNORMAL LOW (ref 26.0–34.0)
MCHC: 30.4 g/dL (ref 30.0–36.0)
MCV: 84.3 fL (ref 80.0–100.0)
Platelets: 350 K/uL (ref 150–400)
RBC: 2.93 MIL/uL — ABNORMAL LOW (ref 4.22–5.81)
RDW: 17 % — ABNORMAL HIGH (ref 11.5–15.5)
WBC: 12.3 K/uL — ABNORMAL HIGH (ref 4.0–10.5)
nRBC: 0.2 % (ref 0.0–0.2)

## 2023-11-02 LAB — HEMOGLOBIN AND HEMATOCRIT, BLOOD
HCT: 24.4 % — ABNORMAL LOW (ref 39.0–52.0)
HCT: 23.1 % — ABNORMAL LOW (ref 39.0–52.0)
Hemoglobin: 7.5 g/dL — ABNORMAL LOW (ref 13.0–17.0)
Hemoglobin: 7.2 g/dL — ABNORMAL LOW (ref 13.0–17.0)

## 2023-11-02 LAB — TSH: TSH: 2.746 u[IU]/mL (ref 0.350–4.500)

## 2023-11-02 LAB — CBG MONITORING, ED: Glucose-Capillary: 119 mg/dL — ABNORMAL HIGH (ref 70–99)

## 2023-11-02 LAB — PROTIME-INR
INR: 1 (ref 0.8–1.2)
Prothrombin Time: 14.2 s (ref 11.4–15.2)

## 2023-11-02 LAB — MAGNESIUM: Magnesium: 2.4 mg/dL (ref 1.7–2.4)

## 2023-11-02 LAB — TROPONIN I (HIGH SENSITIVITY): Troponin I (High Sensitivity): 16 ng/L (ref <18)

## 2023-11-02 LAB — PREPARE RBC (CROSSMATCH)

## 2023-11-02 SURGERY — EGD (ESOPHAGOGASTRODUODENOSCOPY)
Anesthesia: General

## 2023-11-02 MED ORDER — ISOSORBIDE MONONITRATE ER 60 MG PO TB24
60.0000 mg | ORAL_TABLET | Freq: Every day | ORAL | Status: DC
  Administered 2023-11-02 – 2023-11-04 (×3): 60 mg via ORAL
  Filled 2023-11-02 (×3): qty 1

## 2023-11-02 MED ORDER — LIDOCAINE HCL (PF) 2 % IJ SOLN
INTRAMUSCULAR | Status: DC | PRN
  Administered 2023-11-02: 40 mg via INTRADERMAL

## 2023-11-02 MED ORDER — PANTOPRAZOLE SODIUM 40 MG IV SOLR
80.0000 mg | Freq: Once | INTRAVENOUS | Status: AC
  Administered 2023-11-02: 80 mg via INTRAVENOUS
  Filled 2023-11-02: qty 20

## 2023-11-02 MED ORDER — LABETALOL HCL 200 MG PO TABS
200.0000 mg | ORAL_TABLET | Freq: Two times a day (BID) | ORAL | Status: DC
  Administered 2023-11-02 – 2023-11-04 (×5): 200 mg via ORAL
  Filled 2023-11-02 (×5): qty 1

## 2023-11-02 MED ORDER — CLONIDINE HCL 0.1 MG PO TABS
0.3000 mg | ORAL_TABLET | Freq: Three times a day (TID) | ORAL | Status: DC
  Administered 2023-11-02 – 2023-11-04 (×7): 0.3 mg via ORAL
  Filled 2023-11-02 (×7): qty 3

## 2023-11-02 MED ORDER — TRAZODONE HCL 50 MG PO TABS: 25.0000 mg | ORAL_TABLET | Freq: Every evening | ORAL | Status: DC | PRN

## 2023-11-02 MED ORDER — ONDANSETRON HCL 4 MG/2ML IJ SOLN: 4.0000 mg | Freq: Four times a day (QID) | INTRAMUSCULAR | Status: DC | PRN

## 2023-11-02 MED ORDER — PANTOPRAZOLE SODIUM 40 MG IV SOLR
40.0000 mg | Freq: Two times a day (BID) | INTRAVENOUS | Status: DC
  Administered 2023-11-02 – 2023-11-04 (×4): 40 mg via INTRAVENOUS
  Filled 2023-11-02 (×5): qty 10

## 2023-11-02 MED ORDER — ACETAMINOPHEN 650 MG RE SUPP: 650.0000 mg | Freq: Four times a day (QID) | RECTAL | Status: DC | PRN

## 2023-11-02 MED ORDER — TOPIRAMATE 25 MG PO TABS
50.0000 mg | ORAL_TABLET | Freq: Two times a day (BID) | ORAL | Status: DC
  Administered 2023-11-02 – 2023-11-04 (×4): 50 mg via ORAL
  Filled 2023-11-02 (×4): qty 2

## 2023-11-02 MED ORDER — HYDRALAZINE HCL 50 MG PO TABS
100.0000 mg | ORAL_TABLET | Freq: Three times a day (TID) | ORAL | Status: DC
  Administered 2023-11-02 – 2023-11-04 (×7): 100 mg via ORAL
  Filled 2023-11-02 (×7): qty 2

## 2023-11-02 MED ORDER — ACETAMINOPHEN 325 MG PO TABS
650.0000 mg | ORAL_TABLET | Freq: Four times a day (QID) | ORAL | Status: DC | PRN
  Administered 2023-11-02: 650 mg via ORAL
  Filled 2023-11-02: qty 2

## 2023-11-02 MED ORDER — PROPOFOL 10 MG/ML IV BOLUS
INTRAVENOUS | Status: DC | PRN
  Administered 2023-11-02: 90 mg via INTRAVENOUS

## 2023-11-02 MED ORDER — SODIUM CHLORIDE 0.9 % IV SOLN: INTRAVENOUS | Status: DC | PRN

## 2023-11-02 MED ORDER — GLIPIZIDE ER 10 MG PO TB24
10.0000 mg | ORAL_TABLET | Freq: Every day | ORAL | Status: DC
  Administered 2023-11-03 – 2023-11-04 (×2): 10 mg via ORAL
  Filled 2023-11-02 (×3): qty 1

## 2023-11-02 MED ORDER — ONDANSETRON HCL 4 MG PO TABS: 4.0000 mg | ORAL_TABLET | Freq: Four times a day (QID) | ORAL | Status: DC | PRN

## 2023-11-02 MED ORDER — SODIUM CHLORIDE 0.9 % IV SOLN
10.0000 mL/h | Freq: Once | INTRAVENOUS | Status: AC
  Administered 2023-11-02: 10 mL/h via INTRAVENOUS

## 2023-11-02 MED ORDER — DOXAZOSIN MESYLATE 4 MG PO TABS
8.0000 mg | ORAL_TABLET | Freq: Every day | ORAL | Status: DC
  Administered 2023-11-02 – 2023-11-03 (×2): 8 mg via ORAL
  Filled 2023-11-02 (×2): qty 2

## 2023-11-02 MED ORDER — SODIUM CHLORIDE 0.9 % IV SOLN
INTRAVENOUS | Status: DC
Start: 2023-11-02 — End: 2023-11-02

## 2023-11-02 NOTE — ED Notes (Signed)
 Pt receiving second unit of blood at this time. No reaction noted at this time.  Pt was assisted into a recliner and is seat in same watching tv.

## 2023-11-02 NOTE — H&P (Signed)
    PATIENT NAME: Mitchell Robbins    MR#:  [id number]  DATE OF BIRTH:  1956-03-10  DATE OF ADMISSION:  11/01/2023  PRIMARY CARE PHYSICIAN: Auston Reyes BIRCH, MD   Patient is coming from: Home  REQUESTING/REFERRING PHYSICIAN: Ward, Josette SAILOR, DO  CHIEF COMPLAINT:   Chief Complaint  Patient presents with   Palpitations   Chest Pain    HISTORY OF PRESENT ILLNESS:  Mitchell Robbins is a 68 y.o. African-American male with medical history significant for type 2 diabetes mellitus, hypertension and peptic ulcer disease with recently diagnosed nonbleeding gastric erosions and bleeding duodenal ulcer on EGD on 8/18, presented to the emergency room with acute onset of chest pain feeling like heartburn and spoke with dyspnea and palpitations.  He denied any abdominal pain.  He has been having melanotic stools on an intermittent basis since discharge.  No bright red bleeding per rectum.  No bilious vomitus or hematemesis.  No fever or chills.  No chest pain or palpitations.  He denied any leg pain or edema or recent travels or surgeries.  He was recently admitted here for his bleeding duodenal ulcer and blood loss anemia and received PRBCs.  No dysuria, oliguria or hematuria or flank pain.  No other bleeding diathesis.  The patient admitted to fatigue and tiredness as well as dyspnea on exertion.  ED Course: When the patient came to the ER, vital signs were within normal.  Labs revealed a creatinine of 1.69 and calcium 8.2 and blood glucose 171.  Magnesium was 2.4.  CBC showed leukocytosis of 13.1.  Hemoglobin was 6.6 and hematocrit 22.1 compared to 7.6 and hemoglobin on 8/19.  INR is 1 and PT 14.2.  He was typed and crossmatch.  TSH was 2.74. EKG as reviewed by me : EKG showed sinus rhythm with a rate of 91 with a right bundle branch block and minimal voltage criteria for LVH with Q waves inferiorly. Imaging: 2 view chest x-ray showed no acute cardiopulmonary disease.  The patient was  given 80 mg of IV Protonix .  He will be admitted to a progressive unit observation bed for further evaluation and management. PAST MEDICAL HISTORY:   Past Medical History:  Diagnosis Date   Diabetes mellitus without complication (HCC)    Hypertension    Peptic ulcer    bleeding ulcer 6 years ago    PAST SURGICAL HISTORY:   Past Surgical History:  Procedure Laterality Date   COLONOSCOPY WITH PROPOFOL  N/A 01/21/2018   Procedure: COLONOSCOPY WITH BIOPSIES;  Surgeon: Jinny Carmine, MD;  Location: [facility];  Service: Endoscopy;  Laterality: N/A;   COLONOSCOPY WITH PROPOFOL  N/A 01/11/2019   Procedure: COLONOSCOPY WITH PROPOFOL ;  Surgeon: Jinny Carmine, MD;  Location: [facility] ENDOSCOPY;  Service: Endoscopy;  Laterality: N/A;   COLONOSCOPY WITH PROPOFOL  N/A 06/17/2022   Procedure: COLONOSCOPY WITH PROPOFOL ;  Surgeon: Jinny Carmine, MD;  Location: [facility] ENDOSCOPY;  Service: Endoscopy;  Laterality: N/A;   ESOPHAGOGASTRODUODENOSCOPY N/A 10/26/2023   Procedure: EGD (ESOPHAGOGASTRODUODENOSCOPY);  Surgeon: Jinny Carmine, MD;  Location: [facility];  Service: Endoscopy;  Laterality: N/A;   ESOPHAGOGASTRODUODENOSCOPY (EGD) WITH PROPOFOL  N/A 12/24/2017   Procedure: ESOPHAGOGASTRODUODENOSCOPY (EGD) WITH PROPOFOL ;  Surgeon: Jinny Carmine, MD;  Location: [facility] ENDOSCOPY;  Service: Endoscopy;  Laterality: N/A;   ESOPHAGOGASTRODUODENOSCOPY (EGD) WITH PROPOFOL  N/A 01/21/2018   Procedure: ESOPHAGOGASTRODUODENOSCOPY (EGD) WITH BIOPSIES;  Surgeon: Jinny Carmine, MD;  Location: [facility];  Service: Endoscopy;  Laterality: N/A;   HEMOSTASIS CLIP PLACEMENT  10/26/2023   Procedure: CONTROL OF HEMORRHAGE, GI TRACT, ENDOSCOPIC, BY CLIPPING OR OVERSEWING;  Surgeon: Jinny Carmine, MD;  Location: [facility] ENDOSCOPY;  Service: Endoscopy;;   POLYPECTOMY N/A 01/21/2018   Procedure: POLYPECTOMY;  Surgeon: Jinny Carmine, MD;  Location: [facility];  Service: Endoscopy;  Laterality: N/A;   SUBMUCOSAL INJECTION   10/26/2023   Procedure: INJECTION, SUBMUCOSAL;  Surgeon: Jinny Carmine, MD;  Location: [facility] ENDOSCOPY;  Service: Endoscopy;;   THYROIDECTOMY      SOCIAL HISTORY:   Social History   Tobacco Use   Smoking status: Every Day    Current packs/day: 1.00    Average packs/day: 1 pack/day for 40.0 years (40.0 ttl pk-yrs)    Types: Cigarettes    Passive exposure: Current   Smokeless tobacco: Never  Substance Use Topics   Alcohol use: No    Comment: quit drinking alcohol long time ago    FAMILY HISTORY:   Family History  Problem Relation Age of Onset   Diabetes Mother    Hypertension Mother    Diabetes Brother     DRUG ALLERGIES:  No Known Allergies  REVIEW OF SYSTEMS:   ROS As per history of present illness. All pertinent systems were reviewed above. Constitutional, HEENT, cardiovascular, respiratory, GI, GU, musculoskeletal, neuro, psychiatric, endocrine, integumentary and hematologic systems were reviewed and are otherwise negative/unremarkable except for positive findings mentioned above in the HPI.   MEDICATIONS AT HOME:   Prior to Admission medications   Medication Sig Start Date End Date Taking? Authorizing Provider  acetaminophen  (TYLENOL ) 650 MG CR tablet Take 650 mg by mouth every 8 (eight) hours if needed for mild pain Do not crush, chew, or split.    [provider]  cloNIDine  (CATAPRES ) 0.3 MG tablet Take 0.3 mg by mouth 3 (three) times daily. 12/08/22   [provider]  doxazosin  (CARDURA ) 4 MG tablet Take 8 mg by mouth at bedtime. 01/04/20   [provider]  glipiZIDE  (GLUCOTROL  XL) 10 MG 24 hr tablet Take 10 mg by mouth daily. 07/29/23   [provider]  hydrALAZINE  (APRESOLINE ) 100 MG tablet Take 100 mg by mouth 3 (three) times daily. 07/16/22   [provider]  hydrochlorothiazide  (HYDRODIURIL ) 25 MG tablet Take 25 mg by mouth daily. 12/21/22   [provider]  isosorbide  mononitrate (IMDUR ) 60 MG 24 hr tablet  Take 1 tablet by mouth daily. 07/21/22   [provider]  labetalol  (NORMODYNE ) 200 MG tablet Take 200 mg by mouth 2 (two) times daily. 04/26/20   [provider]  metFORMIN (GLUCOPHAGE) 500 MG tablet Take 1,000 mg by mouth daily with supper. 10/17/22   [provider]  pantoprazole  (PROTONIX ) 40 MG tablet Take 1 tablet (40 mg total) by mouth 2 (two) times daily before a meal. 10/27/23 10/26/24  Tobie Calix, MD  topiramate  (TOPAMAX ) 50 MG tablet Take 50 mg by mouth 2 (two) times daily. 09/16/23   [provider]      VITAL SIGNS:  Blood pressure 138/71, pulse 82, temperature 98.4 F (36.9 C), temperature source Oral, resp. rate 20, height 5' 10 (1.778 m), weight 131.5 kg, SpO2 98%.  PHYSICAL EXAMINATION:  Physical Exam  GENERAL:  68 y.o.-year-old African-American male patient lying in the bed with no acute distress.  EYES: Pupils equal, round, reactive to light and accommodation. No scleral icterus.  Positive pallor.  Extraocular muscles intact.  HEENT: Head atraumatic, normocephalic. Oropharynx and nasopharynx clear.  NECK:  Supple, no jugular venous distention. No  thyroid  enlargement, no tenderness.  LUNGS: Normal breath sounds bilaterally, no wheezing, rales,rhonchi or crepitation. No use of accessory muscles of respiration.  CARDIOVASCULAR: Regular rate and rhythm, S1, S2 normal. No murmurs, rubs, or gallops.  ABDOMEN: Soft, nondistended, nontender. Bowel sounds present. No organomegaly or mass.  EXTREMITIES: No pedal edema, cyanosis, or clubbing.  NEUROLOGIC: Cranial nerves II through XII are intact. Muscle strength 5/5 in all extremities. Sensation intact. Gait not checked.  PSYCHIATRIC: The patient is alert and oriented x 3.  Normal affect and good eye contact. SKIN: No obvious rash, lesion, or ulcer.   LABORATORY PANEL:   CBC Recent Labs  Lab 11/01/23 2228  WBC 13.1*  HGB 6.6*  HCT 22.1*  PLT 365    ------------------------------------------------------------------------------------------------------------------  Chemistries  Recent Labs  Lab 11/01/23 2228 11/02/23 0009  NA 137  --   K 3.7  --   CL 102  --   CO2 26  --   GLUCOSE 171*  --   BUN 19  --   CREATININE 1.69*  --   CALCIUM 8.2*  --   MG  --  2.4   ------------------------------------------------------------------------------------------------------------------  Cardiac Enzymes No results for input(s): TROPONINI in the last 168 hours. ------------------------------------------------------------------------------------------------------------------  RADIOLOGY:  DG Chest 2 View Result Date: 11/01/2023 CLINICAL DATA:  Chest pain for several days EXAM: CHEST - 2 VIEW COMPARISON:  10/25/2023 FINDINGS: Check shadow is stable. Lungs are clear bilaterally. No focal infiltrate or effusion is seen. No bony abnormality is noted. IMPRESSION: No active cardiopulmonary disease. Electronically Signed   By: Oneil Devonshire M.D.   On: 11/01/2023 22:44      IMPRESSION AND PLAN:  Assessment and Plan: * GI bleed - This is recurrent and is likely related to recurrent peptic ulcer disease with recently diagnosed bleeding duodenal ulcer. - The patient will be admitted to an observation progressive unit bed. - Will follow serial hemoglobins and hematocrits. - The patient was typed and crossmatch and will be transfused 2 units of packed red blood cells. - We will follow posttransfusion H&H. - Will continue IV Protonix . - GI consult to be obtained. - I notified Dr. Therisa about the patient. - Vascular surgery consult to be obtained. - I notified Dr. Tisa about the patient.   Type 2 diabetes mellitus with chronic kidney disease, without long-term current use of insulin  (HCC) - The patient will be placed on supplemental coverage with NovoLog . - We will hold off metformin. - Will continue Glucotrol  XL.   Essential hypertension -  Will continue antihypertensive therapy while holding off diuretics.   DVT prophylaxis: SCDs. Advanced Care Planning:  Code Status: full code. Family Communication:  The plan of care was discussed in details with the patient (and family). I answered all questions. The patient agreed to proceed with the above mentioned plan. Further management will depend upon hospital course. Disposition Plan: Back to previous home environment Consults called: GI All the records are reviewed and case discussed with ED provider.  Status is: Observation  I certify that at the time of admission, it is my clinical judgment that the patient will require  hospital care extending less than 2 midnights.                            Dispo: The patient is from: Home              Anticipated d/c is to: Home  Patient currently is not medically stable to d/c.              Difficult to place patient: No  Madison DELENA Peaches M.D on 11/02/2023 at 2:52 AM  [facility]   From 7 PM-7 AM, contact night-coverage [email or web address]  CC: Primary care physician; Auston Reyes BIRCH, MD

## 2023-11-02 NOTE — Assessment & Plan Note (Addendum)
-   This is recurrent and is likely related to recurrent peptic ulcer disease with recently diagnosed bleeding duodenal ulcer. - The patient will be admitted to an observation progressive unit bed. - Will follow serial hemoglobins and hematocrits. - The patient was typed and crossmatch and will be transfused 2 units of packed red blood cells. - We will follow posttransfusion H&H. - Will continue IV Protonix . - GI consult to be obtained. - I notified Dr. Therisa about the patient. - Vascular surgery consult to be obtained. - I notified Dr. Tisa about the patient.

## 2023-11-02 NOTE — Assessment & Plan Note (Signed)
-   Will continue antihypertensive therapy while holding off diuretics.

## 2023-11-02 NOTE — Anesthesia Preprocedure Evaluation (Signed)
 Anesthesia Evaluation  Patient identified by MRN, date of birth, ID band Patient awake    Reviewed: Allergy & Precautions, NPO status , Patient's Chart, lab work & pertinent test results  Airway Mallampati: III  TM Distance: >3 FB Neck ROM: full    Dental  (+) Missing, Poor Dentition, Loose, Dental Advisory Given   Pulmonary neg pulmonary ROS, sleep apnea , COPD, Current Smoker and Patient abstained from smoking.   Pulmonary exam normal  + decreased breath sounds      Cardiovascular Exercise Tolerance: Poor hypertension, Pt. on medications negative cardio ROS Normal cardiovascular exam+ dysrhythmias  Rhythm:Regular Rate:Normal     Neuro/Psych negative neurological ROS  negative psych ROS   GI/Hepatic negative GI ROS, Neg liver ROS, PUD,,,  Endo/Other  negative endocrine ROSdiabetes  Class 4 obesity  Renal/GU negative Renal ROS  negative genitourinary   Musculoskeletal   Abdominal  (+) + obese  Peds negative pediatric ROS (+)  Hematology negative hematology ROS (+) Blood dyscrasia, anemia   Anesthesia Other Findings Past Medical History: No date: Diabetes mellitus without complication (HCC) No date: Hypertension No date: Peptic ulcer     Comment:  bleeding ulcer 6 years ago  Past Surgical History: 01/21/2018: COLONOSCOPY WITH PROPOFOL ; N/A     Comment:  Procedure: COLONOSCOPY WITH BIOPSIES;  Surgeon: Jinny Carmine, MD;  Location: [facility];  Service:               Endoscopy;  Laterality: N/A; 01/11/2019: COLONOSCOPY WITH PROPOFOL ; N/A     Comment:  Procedure: COLONOSCOPY WITH PROPOFOL ;  Surgeon: Jinny Carmine, MD;  Location: [facility] ENDOSCOPY;  Service:               Endoscopy;  Laterality: N/A; 06/17/2022: COLONOSCOPY WITH PROPOFOL ; N/A     Comment:  Procedure: COLONOSCOPY WITH PROPOFOL ;  Surgeon: Jinny Carmine, MD;  Location: [facility] ENDOSCOPY;  Service:                Endoscopy;  Laterality: N/A; 10/26/2023: ESOPHAGOGASTRODUODENOSCOPY; N/A     Comment:  Procedure: EGD (ESOPHAGOGASTRODUODENOSCOPY);  Surgeon:               Jinny Carmine, MD;  Location: [facility] ENDOSCOPY;  Service:               Endoscopy;  Laterality: N/A; 12/24/2017: ESOPHAGOGASTRODUODENOSCOPY (EGD) WITH PROPOFOL ; N/A     Comment:  Procedure: ESOPHAGOGASTRODUODENOSCOPY (EGD) WITH               PROPOFOL ;  Surgeon: Jinny Carmine, MD;  Location: [facility]               ENDOSCOPY;  Service: Endoscopy;  Laterality: N/A; 01/21/2018: ESOPHAGOGASTRODUODENOSCOPY (EGD) WITH PROPOFOL ; N/A     Comment:  Procedure: ESOPHAGOGASTRODUODENOSCOPY (EGD) WITH               BIOPSIES;  Surgeon: Jinny Carmine, MD;  Location: [facility]               SUR[facility]ERY [facility];  Service: Endoscopy;  Laterality: N/A; 10/26/2023: HEMOSTASIS CLIP PLACEMENT     Comment:  Procedure: CONTROL OF HEMORRHAGE, GI TRACT, ENDOSCOPIC,               BY CLIPPING OR OVERSEWING;  Surgeon: Jinny Carmine,  MD;                Location: [facility] ENDOSCOPY;  Service: Endoscopy;; 01/21/2018: POLYPECTOMY; N/A     Comment:  Procedure: POLYPECTOMY;  Surgeon: Jinny Carmine, MD;                Location: [facility];  Service: Endoscopy;                Laterality: N/A; 10/26/2023: SUBMUCOSAL INJECTION     Comment:  Procedure: INJECTION, SUBMUCOSAL;  Surgeon: Jinny Carmine, MD;  Location: [facility] ENDOSCOPY;  Service:               Endoscopy;; No date: THYROIDECTOMY  BMI    Body Mass Index: 41.61 kg/m      Reproductive/Obstetrics negative OB ROS                              Anesthesia Physical Anesthesia Plan  ASA: 3  Anesthesia Plan: General   Post-op Pain Management:    Induction: Intravenous  PONV Risk Score and Plan: Propofol  infusion and TIVA  Airway Management Planned: Natural Airway and Nasal Cannula  Additional Equipment:   Intra-op Plan:   Post-operative Plan:   Informed Consent: I have reviewed  the patients History and Physical, chart, labs and discussed the procedure including the risks, benefits and alternatives for the proposed anesthesia with the patient or authorized representative who has indicated his/her understanding and acceptance.     Dental Advisory Given  Plan Discussed with: CRNA  Anesthesia Plan Comments:          Anesthesia Quick Evaluation

## 2023-11-02 NOTE — ED Provider Notes (Signed)
 [facility] Provider Note    Event Date/Time   First MD Initiated Contact with Patient 11/01/23 2351     (approximate)   History   Palpitations and Chest Pain   HPI  [name] is a 68 y.o. male with history of hypertension, diabetes, recent diagnosis of duodenal ulcer that was bleeding who presents to the emergency department with chest pain, shortness of breath.  Feels like a burning in his chest.  No abdominal pain.  States that he started seeing black-colored stool again.  He is not on blood thinners.  No bright red blood per rectum.  No vomiting.  No calf tenderness or calf swelling.  No prior history of PE or DVT.  During recent admission he did receive a blood transfusion.   History provided by patient.    Past Medical History:  Diagnosis Date   Diabetes mellitus without complication (HCC)    Hypertension    Peptic ulcer    bleeding ulcer 6 years ago    Past Surgical History:  Procedure Laterality Date   COLONOSCOPY WITH PROPOFOL  N/A 01/21/2018   Procedure: COLONOSCOPY WITH BIOPSIES;  Surgeon: Jinny Carmine, MD;  Location: [facility];  Service: Endoscopy;  Laterality: N/A;   COLONOSCOPY WITH PROPOFOL  N/A 01/11/2019   Procedure: COLONOSCOPY WITH PROPOFOL ;  Surgeon: Jinny Carmine, MD;  Location: [facility] ENDOSCOPY;  Service: Endoscopy;  Laterality: N/A;   COLONOSCOPY WITH PROPOFOL  N/A 06/17/2022   Procedure: COLONOSCOPY WITH PROPOFOL ;  Surgeon: Jinny Carmine, MD;  Location: [facility] ENDOSCOPY;  Service: Endoscopy;  Laterality: N/A;   ESOPHAGOGASTRODUODENOSCOPY N/A 10/26/2023   Procedure: EGD (ESOPHAGOGASTRODUODENOSCOPY);  Surgeon: Jinny Carmine, MD;  Location: [facility] ENDOSCOPY;  Service: Endoscopy;  Laterality: N/A;   ESOPHAGOGASTRODUODENOSCOPY (EGD) WITH PROPOFOL  N/A 12/24/2017   Procedure: ESOPHAGOGASTRODUODENOSCOPY (EGD) WITH PROPOFOL ;  Surgeon: Jinny Carmine, MD;  Location: [facility] ENDOSCOPY;  Service: Endoscopy;  Laterality: N/A;    ESOPHAGOGASTRODUODENOSCOPY (EGD) WITH PROPOFOL  N/A 01/21/2018   Procedure: ESOPHAGOGASTRODUODENOSCOPY (EGD) WITH BIOPSIES;  Surgeon: Jinny Carmine, MD;  Location: [facility];  Service: Endoscopy;  Laterality: N/A;   HEMOSTASIS CLIP PLACEMENT  10/26/2023   Procedure: CONTROL OF HEMORRHAGE, GI TRACT, ENDOSCOPIC, BY CLIPPING OR OVERSEWING;  Surgeon: Jinny Carmine, MD;  Location: [facility] ENDOSCOPY;  Service: Endoscopy;;   POLYPECTOMY N/A 01/21/2018   Procedure: POLYPECTOMY;  Surgeon: Jinny Carmine, MD;  Location: [facility];  Service: Endoscopy;  Laterality: N/A;   SUBMUCOSAL INJECTION  10/26/2023   Procedure: INJECTION, SUBMUCOSAL;  Surgeon: Jinny Carmine, MD;  Location: [facility] ENDOSCOPY;  Service: Endoscopy;;   THYROIDECTOMY      MEDICATIONS:  Prior to Admission medications   Medication Sig Start Date End Date Taking? Authorizing Provider  acetaminophen  (TYLENOL ) 650 MG CR tablet Take 650 mg by mouth every 8 (eight) hours if needed for mild pain Do not crush, chew, or split.    [provider]  cloNIDine  (CATAPRES ) 0.3 MG tablet Take 0.3 mg by mouth 3 (three) times daily. 12/08/22   [provider]  doxazosin  (CARDURA ) 4 MG tablet Take 8 mg by mouth at bedtime. 01/04/20   [provider]  glipiZIDE  (GLUCOTROL  XL) 10 MG 24 hr tablet Take 10 mg by mouth daily. 07/29/23   [provider]  hydrALAZINE  (APRESOLINE ) 100 MG tablet Take 100 mg by mouth 3 (three) times daily. 07/16/22   [provider]  hydrochlorothiazide  (HYDRODIURIL ) 25 MG tablet Take 25 mg by mouth daily. 12/21/22   [provider]  isosorbide  mononitrate (IMDUR ) 60 MG  24 hr tablet Take 1 tablet by mouth daily. 07/21/22   [provider]  labetalol  (NORMODYNE ) 200 MG tablet Take 200 mg by mouth 2 (two) times daily. 04/26/20   [provider]  metFORMIN (GLUCOPHAGE) 500 MG tablet Take 1,000 mg by mouth daily with supper. 10/17/22   [provider]   pantoprazole  (PROTONIX ) 40 MG tablet Take 1 tablet (40 mg total) by mouth 2 (two) times daily before a meal. 10/27/23 10/26/24  Mitchell Calix, MD  topiramate  (TOPAMAX ) 50 MG tablet Take 50 mg by mouth 2 (two) times daily. 09/16/23   [provider]    Physical Exam   Triage Vital Signs: ED Triage Vitals  Encounter Vitals Group     BP 11/01/23 2232 123/75     Girls Systolic BP Percentile --      Girls Diastolic BP Percentile --      Boys Systolic BP Percentile --      Boys Diastolic BP Percentile --      Pulse Rate 11/01/23 2221 96     Resp 11/01/23 2232 20     Temp 11/01/23 2232 98.1 F (36.7 C)     Temp src --      SpO2 11/01/23 2221 97 %     Weight 11/01/23 2231 290 lb (131.5 kg)     Height 11/01/23 2231 5' 10 (1.778 m)     Head Circumference --      Peak Flow --      Pain Score --      Pain Loc --      Pain Education --      Exclude from Growth Chart --     Most recent vital signs: Vitals:   11/01/23 2232 11/02/23 0015  BP: 123/75 133/70  Pulse: 93 80  Resp: 20 18  Temp: 98.1 F (36.7 C)   SpO2: 98% 98%    CONSTITUTIONAL: Alert, responds appropriately to questions.  Obese, pleasant HEAD: Normocephalic, atraumatic EYES: Conjunctivae clear, pupils appear equal, sclera nonicteric ENT: normal nose; moist mucous membranes NECK: Supple, normal ROM CARD: RRR; S1 and S2 appreciated RESP: Normal chest excursion without splinting or tachypnea; breath sounds clear and equal bilaterally; no wheezes, no rhonchi, no rales, no hypoxia or respiratory distress, speaking full sentences ABD/GI: Non-distended; soft, non-tender, no rebound, no guarding, no peritoneal signs RECTAL:  Normal rectal tone, no gross blood or melena as no significant stool on exam, guaiac POSITIVE, no hemorrhoids appreciated, nontender rectal exam, no fecal impaction. Chaperone present. BACK: The back appears normal EXT: Normal ROM in all joints; no deformity noted, no edema SKIN: Normal color for  age and race; warm; no rash on exposed skin NEURO: Moves all extremities equally, normal speech PSYCH: The patient's mood and manner are appropriate.   ED Results / Procedures / Treatments   LABS: (all labs ordered are listed, but only abnormal results are displayed) Labs Reviewed  BASIC METABOLIC PANEL WITH GFR - Abnormal; Notable for the following components:      Result Value   Glucose, Bld 171 (*)    Creatinine, Ser 1.69 (*)    Calcium 8.2 (*)    GFR, Estimated 43 (*)    All other components within normal limits  CBC - Abnormal; Notable for the following components:   WBC 13.1 (*)    RBC 2.58 (*)    Hemoglobin 6.6 (*)    HCT 22.1 (*)    MCH 25.6 (*)    MCHC 29.9 (*)  RDW 17.4 (*)    All other components within normal limits  PROTIME-INR  MAGNESIUM  TSH  BASIC METABOLIC PANEL WITH GFR  CBC  HEMOGLOBIN AND HEMATOCRIT, BLOOD  HEMOGLOBIN AND HEMATOCRIT, BLOOD  HEMOGLOBIN AND HEMATOCRIT, BLOOD  TYPE AND SCREEN  PREPARE RBC (CROSSMATCH)  TROPONIN I (HIGH SENSITIVITY)  TROPONIN I (HIGH SENSITIVITY)     EKG:  EKG Interpretation Date/Time:  Sunday November 01 2023 22:25:43 EDT Ventricular Rate:  91 PR Interval:  144 QRS Duration:  162 QT Interval:  402 QTC Calculation: 494 R Axis:   -9  Text Interpretation: Normal sinus rhythm Right bundle branch block Minimal voltage criteria for LVH, may be normal variant ( R in aVL ) Inferior infarct (cited on or before 25-Oct-2023) Abnormal ECG When compared with ECG of 25-Oct-2023 14:24, No significant change was found Confirmed by Neomi Neptune [phone]) on 11/02/2023 12:45:24 AM         RADIOLOGY: My personal review and interpretation of imaging: Chest x-ray clear.  I have personally reviewed all radiology reports.   DG Chest 2 View Result Date: 11/01/2023 CLINICAL DATA:  Chest pain for several days EXAM: CHEST - 2 VIEW COMPARISON:  10/25/2023 FINDINGS: Check shadow is stable. Lungs are clear bilaterally. No focal  infiltrate or effusion is seen. No bony abnormality is noted. IMPRESSION: No active cardiopulmonary disease. Electronically Signed   By: Oneil Devonshire M.D.   On: 11/01/2023 22:44     PROCEDURES:  Critical Care performed: Yes, see critical care procedure note(s)   CRITICAL CARE Performed by: Neptune Olman Yono   Total critical care time: 30 minutes  Critical care time was exclusive of separately billable procedures and treating other patients.  Critical care was necessary to treat or prevent imminent or life-threatening deterioration.  Critical care was time spent personally by me on the following activities: development of treatment plan with patient and/or surrogate as well as nursing, discussions with consultants, evaluation of patient's response to treatment, examination of patient, obtaining history from patient or surrogate, ordering and performing treatments and interventions, ordering and review of laboratory studies, ordering and review of radiographic studies, pulse oximetry and re-evaluation of patient's condition.   SABRA1-3 Lead EKG Interpretation  Performed by: Ennio Houp, Neptune SAILOR, DO Authorized by: Dewitte Vannice, Neptune SAILOR, DO     Interpretation: normal     ECG rate:  80   ECG rate assessment: normal     Rhythm: sinus rhythm     Ectopy: none     Conduction: normal       IMPRESSION / MDM / ASSESSMENT AND PLAN / ED COURSE  I reviewed the triage vital signs and the nursing notes.    Patient here with chest pain, shortness of breath.  Also complaining of melena.  Recently diagnosed with a bleeding duodenal ulcer.  The patient is on the cardiac monitor to evaluate for evidence of arrhythmia and/or significant heart rate changes.   DIFFERENTIAL DIAGNOSIS (includes but not limited to):   Symptomatic anemia, ACS, PE, CHF, COPD, pneumonia, pneumothorax   Patient's presentation is most consistent with acute presentation with potential threat to life or bodily function.   PLAN: Labs  obtained from triage show hemoglobin of 6.6.  This dropped from 7.6 on 10/27/2023.  Normal platelets.  Does have a leukocytosis of 13,000 which appears chronic.  No infectious symptoms.  Also has elevated creatinine which appears around his baseline.  Anemia likely from upper GI bleed and chronic kidney disease.  Will give 2 units of  packed red blood cells.  No significant stool on exam but what is present is strongly guaiac positive.  INR normal.  First troponin negative.  Second pending.  Suspect chest pain, shortness of breath due to symptomatic anemia.  EKG shows right bundle branch block which is unchanged.  Will start Protonix .  Recommended admission to the hospital and he agrees.  It appears per GI note, they recommended vascular surgery evaluation for potential embolization if bleeding returned.   MEDICATIONS GIVEN IN ED: Medications  0.9 %  sodium chloride  infusion (has no administration in time range)  cloNIDine  (CATAPRES ) tablet 0.3 mg (has no administration in time range)  doxazosin  (CARDURA ) tablet 8 mg (has no administration in time range)  hydrALAZINE  (APRESOLINE ) tablet 100 mg (has no administration in time range)  isosorbide  mononitrate (IMDUR ) 24 hr tablet 60 mg (has no administration in time range)  labetalol  (NORMODYNE ) tablet 200 mg (has no administration in time range)  glipiZIDE  (GLUCOTROL  XL) 24 hr tablet 10 mg (has no administration in time range)  topiramate  (TOPAMAX ) tablet 50 mg (has no administration in time range)  0.9 %  sodium chloride  infusion (has no administration in time range)  acetaminophen  (TYLENOL ) tablet 650 mg (has no administration in time range)    Or  acetaminophen  (TYLENOL ) suppository 650 mg (has no administration in time range)  traZODone  (DESYREL ) tablet 25 mg (has no administration in time range)  ondansetron  (ZOFRAN ) tablet 4 mg (has no administration in time range)    Or  ondansetron  (ZOFRAN ) injection 4 mg (has no administration in time range)   pantoprazole  (PROTONIX ) injection 40 mg (has no administration in time range)  pantoprazole  (PROTONIX ) injection 80 mg (80 mg Intravenous Given 11/02/23 0016)     ED COURSE:  Consulted and discussed patient's case with hospitalist, Dr. Lawence.  I have recommended admission and consulting physician agrees and will place admission orders.  Patient (and family if present) agree with this plan.   I reviewed all nursing notes, vitals, pertinent previous records.  All labs, EKGs, imaging ordered have been independently reviewed and interpreted by myself.      OUTSIDE RECORDS REVIEWED: Reviewed recent admission notes.       FINAL CLINICAL IMPRESSION(S) / ED DIAGNOSES   Final diagnoses:  Upper GI bleed  Symptomatic anemia     Rx / DC Orders   ED Discharge Orders     None        Note:  This document was prepared using Dragon voice recognition software and may include unintentional dictation errors.   Nehemyah Foushee, Josette SAILOR, DO 11/02/23 [phone]

## 2023-11-02 NOTE — Progress Notes (Addendum)
 Brief rounding note, same day as admission  HPI: Pt admitted after midnight for recurrent anemia in the setting of recent discharge for upper GI bleed found to be due to a duodenal ulcer.  GI took patient for repeat EGD today and saw no active bleeding.  Interval history: Pt up in recliner.  No BM since Saturday.  No abdominal pain or signs of active bleeding.  No other complaints.   Exam: General exam: awake, alert, no acute distress, obese HEENT: pale conjunctiva and mucus membranes, anicteric sclera, moist mucus membranes, hearing grossly normal  Respiratory system: CTAB, no wheezes, rales or rhonchi, normal respiratory effort. Cardiovascular system: normal S1/S2, RRR, no JVD, murmurs, rubs, gallops, no pedal edema.   Gastrointestinal system: distended/protuberant, non-tender, +bowel sounds. Central nervous system: A&O x 3. no gross focal neurologic deficits, normal speech Extremities: moves all , no edema, normal tone Skin: dry, intact, normal temperature Psychiatry: normal mood, congruent affect, judgement and insight appear normal    A&P: as per H&P by Dr. Lawence, with any changes or additions as below:  --Trend Hbg & transfuse if < 7 --Diet resumed --Vascular following in case of need for emoblization --Obtain stat CTA if signs of active bleeding     No charge

## 2023-11-02 NOTE — Consult Note (Signed)
 Hospital Consult    Reason for Consult:  GI Bleeding Requesting Physician:  Dr [name] MD  MRN #:  [id number]  History of Present Illness: This is a 68 y.o. male who was recently discharged to the hospital with a large duodenal ulcer that was treated with epinephrine  and clips, cautery and hemostatic gel.  The patient came back to the emergency room yesterday with chest pain and was noted to have a drop in his hemoglobin from 7.6-6.6.  He endorses that he took some laxatives after he got home because he could not move his bowels and had a large bowel movement that was dark but not as bad as the previous bowel movements.  Patient received 2 units packed red blood cells overnight.  Gastroenterology is planning to do an another upper endoscopy this morning to reevaluate.  Vascular surgery was consulted to evaluate as well for possible embolization pending findings of upper EGD today. .  Past Medical History:  Diagnosis Date   Diabetes mellitus without complication (HCC)    Hypertension    Peptic ulcer    bleeding ulcer 6 years ago    Past Surgical History:  Procedure Laterality Date   COLONOSCOPY WITH PROPOFOL  N/A 01/21/2018   Procedure: COLONOSCOPY WITH BIOPSIES;  Surgeon: Copping Rogelia, MD;  Location: [facility];  Service: Endoscopy;  Laterality: N/A;   COLONOSCOPY WITH PROPOFOL  N/A 01/11/2019   Procedure: COLONOSCOPY WITH PROPOFOL ;  Surgeon: Copping Rogelia, MD;  Location: [facility] ENDOSCOPY;  Service: Endoscopy;  Laterality: N/A;   COLONOSCOPY WITH PROPOFOL  N/A 06/17/2022   Procedure: COLONOSCOPY WITH PROPOFOL ;  Surgeon: Copping Rogelia, MD;  Location: [facility] ENDOSCOPY;  Service: Endoscopy;  Laterality: N/A;   ESOPHAGOGASTRODUODENOSCOPY N/A 10/26/2023   Procedure: EGD (ESOPHAGOGASTRODUODENOSCOPY);  Surgeon: Copping Rogelia, MD;  Location: [facility] ENDOSCOPY;  Service: Endoscopy;  Laterality: N/A;   ESOPHAGOGASTRODUODENOSCOPY (EGD) WITH PROPOFOL  N/A 12/24/2017   Procedure:  ESOPHAGOGASTRODUODENOSCOPY (EGD) WITH PROPOFOL ;  Surgeon: Copping Rogelia, MD;  Location: [facility] ENDOSCOPY;  Service: Endoscopy;  Laterality: N/A;   ESOPHAGOGASTRODUODENOSCOPY (EGD) WITH PROPOFOL  N/A 01/21/2018   Procedure: ESOPHAGOGASTRODUODENOSCOPY (EGD) WITH BIOPSIES;  Surgeon: Copping Rogelia, MD;  Location: [facility];  Service: Endoscopy;  Laterality: N/A;   HEMOSTASIS CLIP PLACEMENT  10/26/2023   Procedure: CONTROL OF HEMORRHAGE, GI TRACT, ENDOSCOPIC, BY CLIPPING OR OVERSEWING;  Surgeon: Copping Rogelia, MD;  Location: [facility] ENDOSCOPY;  Service: Endoscopy;;   POLYPECTOMY N/A 01/21/2018   Procedure: POLYPECTOMY;  Surgeon: Copping Rogelia, MD;  Location: [facility];  Service: Endoscopy;  Laterality: N/A;   SUBMUCOSAL INJECTION  10/26/2023   Procedure: INJECTION, SUBMUCOSAL;  Surgeon: Copping Rogelia, MD;  Location: [facility] ENDOSCOPY;  Service: Endoscopy;;   THYROIDECTOMY      No Known Allergies  Prior to Admission medications   Medication Sig Start Date End Date Taking? Authorizing Provider  acetaminophen  (TYLENOL ) 650 MG CR tablet Take 650 mg by mouth every 8 (eight) hours if needed for mild pain Do not crush, chew, or split.   Yes [provider]  cloNIDine  (CATAPRES ) 0.3 MG tablet Take 0.3 mg by mouth 3 (three) times daily. 12/08/22  Yes [provider]  doxazosin  (CARDURA ) 4 MG tablet Take 8 mg by mouth at bedtime. 01/04/20  Yes [provider]  glipiZIDE  (GLUCOTROL  XL) 10 MG 24 hr tablet Take 10 mg by mouth daily. 07/29/23  Yes [provider]  hydrALAZINE  (APRESOLINE ) 100 MG tablet Take 100 mg by mouth 3 (three) times daily. 07/16/22  Yes [provider]  hydrochlorothiazide  (HYDRODIURIL )  25 MG tablet Take 25 mg by mouth daily. 12/21/22  Yes [provider]  isosorbide  mononitrate (IMDUR ) 60 MG 24 hr tablet Take 1 tablet by mouth daily. 07/21/22  Yes [provider]  labetalol  (NORMODYNE ) 200 MG tablet Take 200 mg by mouth 2 (two)  times daily. 04/26/20  Yes [provider]  metFORMIN (GLUCOPHAGE) 500 MG tablet Take 1,000 mg by mouth daily with supper. 10/17/22  Yes [provider]  pantoprazole  (PROTONIX ) 40 MG tablet Take 1 tablet (40 mg total) by mouth 2 (two) times daily before a meal. 10/27/23 10/26/24 Yes Tobie Calix, MD  topiramate  (TOPAMAX ) 50 MG tablet Take 50 mg by mouth 2 (two) times daily. 09/16/23  Yes [provider]    Social History   Socioeconomic History   Marital status: Single    Spouse name: Not on file   Number of children: Not on file   Years of education: Not on file   Highest education level: Not on file  Occupational History   Occupation: truck driver  Tobacco Use   Smoking status: Every Day    Current packs/day: 1.00    Average packs/day: 1 pack/day for 40.0 years (40.0 ttl pk-yrs)    Types: Cigarettes    Passive exposure: Current   Smokeless tobacco: Never  Vaping Use   Vaping status: Never Used  Substance and Sexual Activity   Alcohol use: No    Comment: quit drinking alcohol long time ago   Drug use: No   Sexual activity: Not on file  Other Topics Concern   Not on file  Social History Narrative   Not on file   Social Drivers of Health   Financial Resource Strain: Not on file  Food Insecurity: No Food Insecurity (10/25/2023)   Hunger Vital Sign    Worried About Running Out of Food in the Last Year: Never true    Ran Out of Food in the Last Year: Never true  Transportation Needs: No Transportation Needs (10/25/2023)   PRAPARE - Administrator, Civil Service (Medical): No    Lack of Transportation (Non-Medical): No  Physical Activity: Not on file  Stress: Not on file  Social Connections: Patient Declined (10/26/2023)   Social Connection and Isolation Panel    Frequency of Communication with Friends and Family: Patient declined    Frequency of Social Gatherings with Friends and Family: Patient declined    Attends Religious Services:  Patient declined    Database administrator or Organizations: Patient declined    Attends Banker Meetings: Patient declined    Marital Status: Patient declined  Intimate Partner Violence: Not At Risk (10/25/2023)   Humiliation, Afraid, Rape, and Kick questionnaire    Fear of Current or Ex-Partner: No    Emotionally Abused: No    Physically Abused: No    Sexually Abused: No     Family History  Problem Relation Age of Onset   Diabetes Mother    Hypertension Mother    Diabetes Brother     ROS: Otherwise negative unless mentioned in HPI  Physical Examination  Vitals:   11/02/23 0900 11/02/23 0930  BP: (!) 147/76 (!) 155/95  Pulse: 76 72  Resp: (!) 23 (!) 22  Temp:    SpO2: 96% 97%   Body mass index is 41.61 kg/m.  General:  WDWN in NAD Gait: Not observed HENT: WNL, normocephalic Pulmonary: normal non-labored breathing, without Rales, rhonchi,  wheezing Cardiac: regular, without  Murmurs,  rubs or gallops; without carotid bruits Abdomen: Positive bowel sounds throughout, soft, NT/ND, no masses Skin: without rashes Vascular Exam/Pulses: Palpable pulses throughout, all extremities are warm to the touch.  Extremities: without ischemic changes, without Gangrene , without cellulitis; without open wounds;  Musculoskeletal: no muscle wasting or atrophy  Neurologic: A&O X 3;  No focal weakness or paresthesias are detected; speech is fluent/normal Psychiatric:  The pt has Normal affect. Lymph:  Unremarkable  CBC    Component Value Date/Time   WBC 12.3 (H) 11/02/2023 0746   RBC 2.93 (L) 11/02/2023 0746   HGB 7.5 (L) 11/02/2023 0746   HCT 24.7 (L) 11/02/2023 0746   PLT 350 11/02/2023 0746   MCV 84.3 11/02/2023 0746   MCH 25.6 (L) 11/02/2023 0746   MCHC 30.4 11/02/2023 0746   RDW 17.0 (H) 11/02/2023 0746   LYMPHSABS 4.1 (H) 07/04/2022 0149   MONOABS 0.5 07/04/2022 0149   EOSABS 0.2 07/04/2022 0149   BASOSABS 0.1 07/04/2022 0149    BMET    Component Value  Date/Time   NA 139 11/02/2023 0746   K 3.7 11/02/2023 0746   CL 102 11/02/2023 0746   CO2 26 11/02/2023 0746   GLUCOSE 113 (H) 11/02/2023 0746   BUN 16 11/02/2023 0746   CREATININE 1.49 (H) 11/02/2023 0746   CALCIUM 8.4 (L) 11/02/2023 0746   GFRNONAA 50 (L) 11/02/2023 0746   GFRAA >60 10/30/2019 0654    COAGS: Lab Results  Component Value Date   INR 1.0 11/02/2023   INR 1.11 12/23/2017     Non-Invasive Vascular Imaging:   Upper Endoscopy was completed on 10/26/23  Showing Duodenal Ulcer. Epinephrine  injection, cauterization and Gel were used to stop the bleeding.   Statin:  No. Beta Blocker:  Yes.   Aspirin:  No. ACEI:  No. ARB:  No. CCB use:  No Other antiplatelets/anticoagulants:  No.    ASSESSMENT/PLAN: This is a 68 y.o. male who underwent upper endoscopy on 10/26/2023 for duodenal ulcer that was cauterized.  Patient was discharged and returned to the emergency department yesterday with chest pain and a hemoglobin noted to be 6.6.  Plan for today is to undergo another upper endoscopy to make sure he is not bleeding from the prior site.  If he is the plan is to try to recauterize and stop the bleeding.  If this bleeding cannot be controlled via endoscopy vascular surgery will need to embolize this duodenal ulcer.  I spoke to the patient in detail at the bedside in the emergency room this morning concerning the procedure, benefits, risk, complications.  He verbalizes understanding and wishes to proceed if the bleeding cannot be controlled by endoscopic this morning.  We also discussed in detail if there were other areas of bleeding that were unable to be controlled/cauterized we may have to embolize those as well.  Again he verbalizes understanding and wishes to proceed.  Patient has been n.p.o. since midnight last night.  He is not on any blood thinning medication at this time or another any anticoagulation.   -I discussed the case in detail with Dr. Selinda Gu MD and he agrees  with the plan   Gwendlyn JONELLE Shank [facility] 11/02/2023 10:05 AM

## 2023-11-02 NOTE — Transfer of Care (Signed)
 Immediate Anesthesia Transfer of Care Note  Patient: Mitchell Robbins  Procedure(s) Performed: EGD (ESOPHAGOGASTRODUODENOSCOPY)  Patient Location: PACU  Anesthesia Type:MAC  Level of Consciousness: drowsy  Airway & Oxygen Therapy: Patient Spontanous Breathing and Patient connected to nasal cannula oxygen  Post-op Assessment: Report given to RN and Post -op Vital signs reviewed and stable  Post vital signs: Reviewed and stable  Last Vitals:  Vitals Value Taken Time  BP 142/87   Temp    Pulse 88   Resp 16   SpO2 96     Last Pain:  Vitals:   11/02/23 1014  TempSrc: Temporal  PainSc:          Complications: No notable events documented.

## 2023-11-02 NOTE — Anesthesia Postprocedure Evaluation (Signed)
 Anesthesia Post Note  Patient: Mitchell Robbins  Procedure(s) Performed: EGD (ESOPHAGOGASTRODUODENOSCOPY)  Patient location during evaluation: PACU Anesthesia Type: General Level of consciousness: sedated Pain management: pain level controlled Vital Signs Assessment: post-procedure vital signs reviewed and stable Respiratory status: spontaneous breathing Cardiovascular status: stable Anesthetic complications: no   No notable events documented.   Last Vitals:  Vitals:   11/02/23 1206 11/02/23 1214  BP: (!) 133/50 131/65  Pulse: 76 72  Resp: (!) 26 (!) 25  Temp:    SpO2: 98% 99%    Last Pain:  Vitals:   11/02/23 1206  TempSrc:   PainSc: 0-No pain                 VAN STAVEREN,Flynn Lininger

## 2023-11-02 NOTE — Op Note (Signed)
 [facility] Gastroenterology Patient Name: Mitchell Robbins Procedure Date: 11/02/2023 11:26 AM MRN: [id number] Account #: [id number] Date of Birth: 07-10-1955 Admit Type: Outpatient Age: 68 Room: [name] ENDO ROOM 4 Gender: Male Note Status: Finalized Instrument Name: Upper GI Scope [id number] Procedure:             Upper GI endoscopy Indications:           Melena Providers:             [name] MD, MD Medicines:             Propofol  per Anesthesia Complications:         No immediate complications. Procedure:             Pre-Anesthesia Assessment:                        - Prior to the procedure, a History and Physical was                         performed, and patient medications and allergies were                         reviewed. The patient's tolerance of previous                         anesthesia was also reviewed. The risks and benefits                         of the procedure and the sedation options and risks                         were discussed with the patient. All questions were                         answered, and informed consent was obtained. Prior                         Anticoagulants: The patient has taken no anticoagulant                         or antiplatelet agents. ASA Grade Assessment: II - A                         patient with mild systemic disease. After reviewing                         the risks and benefits, the patient was deemed in                         satisfactory condition to undergo the procedure.                        After obtaining informed consent, the endoscope was                         passed under direct vision. Throughout the procedure,                         the patient's blood  pressure, pulse, and oxygen                         saturations were monitored continuously. The Endoscope                         was introduced through the mouth, and advanced to the                         second part of duodenum. The upper  GI endoscopy was                         accomplished without difficulty. The patient tolerated                         the procedure well. Findings:      The examined esophagus was normal.      The entire examined stomach was normal.      One non-bleeding cratered duodenal ulcer with no stigmata of bleeding       was found in the second portion of the duodenum. Impression:            - Normal esophagus.                        - Normal stomach.                        - Non-bleeding duodenal ulcer with no stigmata of                         bleeding.                        - No specimens collected. Recommendation:        - Return patient to hospital ward for ongoing care.                        - Resume previous diet.                        - Continue present medications. Procedure Code(s):     --- Professional ---                        [phone], Esophagogastroduodenoscopy, flexible,                         transoral; diagnostic, including collection of                         specimen(s) by brushing or washing, when performed                         (separate procedure) Diagnosis Code(s):     --- Professional ---                        K92.1, Melena (includes Hematochezia)                        K26.9, Duodenal ulcer, unspecified as acute or  chronic, without hemorrhage or perforation CPT copyright 2022 American Medical Association. All rights reserved. The codes documented in this report are preliminary and upon coder review may  be revised to meet current compliance requirements. [name] MD, MD 11/02/2023 11:43:00 AM This report has been signed electronically. Number of Addenda: 0 Note Initiated On: 11/02/2023 11:26 AM Estimated Blood Loss:  Estimated blood loss: none.      [facility]

## 2023-11-02 NOTE — Assessment & Plan Note (Signed)
-   The patient will be placed on supplemental coverage with NovoLog . - We will hold off metformin. - Will continue Glucotrol  XL.

## 2023-11-02 NOTE — Consult Note (Signed)
 Mitchell Copping, MD [facility]  [address], [address] [place], [place] [address] Phone: [phone] Fax : [phone]  Consultation  Referring Provider:     Dr. Lawence Primary Care Physician:  Mitchell Robbins BIRCH, MD Primary Gastroenterologist: Mitchell Robbins GI         Reason for Consultation:     Anemia  Date of Admission:  11/01/2023 Date of Consultation:  11/02/2023         HPI:   Mitchell Robbins is a 68 y.o. male who was recently discharged to the hospital with a large duodenal ulcer that was treated with epinephrine  and clips cautery and hemostatic gel.  The patient had been discharged without any events.  The patient now comes in yesterday with chest pain and was noted to have a drop in his hemoglobin.  The patient left 6 days ago with a hemoglobin of 7.6 that is down to 6.6 yesterday.  He reports that he had only 1 small black stool since being discharged and could not move his bowels so he took some laxatives and had a larger bowel movement that he states was also dark but in his words was no where near as dark as when he had come in the first time with the duodenal ulcer. The patient denies taking any anti-inflammatory medication and he also denies taking any blood thinners.  The patient received 2 units of packed red blood cells overnight.  Past Medical History:  Diagnosis Date   Diabetes mellitus without complication (HCC)    Hypertension    Peptic ulcer    bleeding ulcer 6 years ago    Past Surgical History:  Procedure Laterality Date   COLONOSCOPY WITH PROPOFOL  N/A 01/21/2018   Procedure: COLONOSCOPY WITH BIOPSIES;  Surgeon: Robbins Rogelia, MD;  Location: [facility];  Service: Endoscopy;  Laterality: N/A;   COLONOSCOPY WITH PROPOFOL  N/A 01/11/2019   Procedure: COLONOSCOPY WITH PROPOFOL ;  Surgeon: Robbins Rogelia, MD;  Location: [facility] ENDOSCOPY;  Service: Endoscopy;  Laterality: N/A;   COLONOSCOPY WITH PROPOFOL  N/A 06/17/2022   Procedure: COLONOSCOPY WITH PROPOFOL ;  Surgeon: Robbins Rogelia, MD;  Location: [facility] ENDOSCOPY;  Service: Endoscopy;  Laterality: N/A;   ESOPHAGOGASTRODUODENOSCOPY N/A 10/26/2023   Procedure: EGD (ESOPHAGOGASTRODUODENOSCOPY);  Surgeon: Robbins Rogelia, MD;  Location: [facility] ENDOSCOPY;  Service: Endoscopy;  Laterality: N/A;   ESOPHAGOGASTRODUODENOSCOPY (EGD) WITH PROPOFOL  N/A 12/24/2017   Procedure: ESOPHAGOGASTRODUODENOSCOPY (EGD) WITH PROPOFOL ;  Surgeon: Robbins Rogelia, MD;  Location: [facility] ENDOSCOPY;  Service: Endoscopy;  Laterality: N/A;   ESOPHAGOGASTRODUODENOSCOPY (EGD) WITH PROPOFOL  N/A 01/21/2018   Procedure: ESOPHAGOGASTRODUODENOSCOPY (EGD) WITH BIOPSIES;  Surgeon: Robbins Rogelia, MD;  Location: [facility];  Service: Endoscopy;  Laterality: N/A;   HEMOSTASIS CLIP PLACEMENT  10/26/2023   Procedure: CONTROL OF HEMORRHAGE, GI TRACT, ENDOSCOPIC, BY CLIPPING OR OVERSEWING;  Surgeon: Robbins Rogelia, MD;  Location: [facility] ENDOSCOPY;  Service: Endoscopy;;   POLYPECTOMY N/A 01/21/2018   Procedure: POLYPECTOMY;  Surgeon: Robbins Rogelia, MD;  Location: [facility];  Service: Endoscopy;  Laterality: N/A;   SUBMUCOSAL INJECTION  10/26/2023   Procedure: INJECTION, SUBMUCOSAL;  Surgeon: Robbins Rogelia, MD;  Location: [facility] ENDOSCOPY;  Service: Endoscopy;;   THYROIDECTOMY      Prior to Admission medications   Medication Sig Start Date End Date Taking? Authorizing Provider  acetaminophen  (TYLENOL ) 650 MG CR tablet Take 650 mg by mouth every 8 (eight) hours if needed for mild pain Do not crush, chew, or split.   Yes [provider]  cloNIDine  (CATAPRES )  0.3 MG tablet Take 0.3 mg by mouth 3 (three) times daily. 12/08/22  Yes [provider]  doxazosin  (CARDURA ) 4 MG tablet Take 8 mg by mouth at bedtime. 01/04/20  Yes [provider]  glipiZIDE  (GLUCOTROL  XL) 10 MG 24 hr tablet Take 10 mg by mouth daily. 07/29/23  Yes [provider]  hydrALAZINE  (APRESOLINE ) 100 MG tablet Take 100 mg by mouth 3 (three) times daily. 07/16/22  Yes  [provider]  hydrochlorothiazide  (HYDRODIURIL ) 25 MG tablet Take 25 mg by mouth daily. 12/21/22  Yes [provider]  isosorbide  mononitrate (IMDUR ) 60 MG 24 hr tablet Take 1 tablet by mouth daily. 07/21/22  Yes [provider]  labetalol  (NORMODYNE ) 200 MG tablet Take 200 mg by mouth 2 (two) times daily. 04/26/20  Yes [provider]  metFORMIN (GLUCOPHAGE) 500 MG tablet Take 1,000 mg by mouth daily with supper. 10/17/22  Yes [provider]  pantoprazole  (PROTONIX ) 40 MG tablet Take 1 tablet (40 mg total) by mouth 2 (two) times daily before a meal. 10/27/23 10/26/24 Yes Tobie Calix, MD  topiramate  (TOPAMAX ) 50 MG tablet Take 50 mg by mouth 2 (two) times daily. 09/16/23  Yes [provider]    Family History  Problem Relation Age of Onset   Diabetes Mother    Hypertension Mother    Diabetes Brother      Social History   Tobacco Use   Smoking status: Every Day    Current packs/day: 1.00    Average packs/day: 1 pack/day for 40.0 years (40.0 ttl pk-yrs)    Types: Cigarettes    Passive exposure: Current   Smokeless tobacco: Never  Vaping Use   Vaping status: Never Used  Substance Use Topics   Alcohol use: No    Comment: quit drinking alcohol long time ago   Drug use: No    Allergies as of 11/01/2023   (No Known Allergies)    Review of Systems:    All systems reviewed and negative except where noted in HPI.   Physical Exam:  Vital signs in last 24 hours: Temp:  [98.1 F (36.7 C)-98.6 F (37 C)] 98.5 F (36.9 C) (08/25 0702) Pulse Rate:  [72-96] 72 (08/25 0702) Resp:  [18-24] 20 (08/25 0702) BP: (123-148)/(65-83) 141/65 (08/25 0702) SpO2:  [90 %-99 %] 97 % (08/25 0702) Weight:  [131.5 kg] 131.5 kg (08/24 2231)   General:   Pleasant, cooperative in NAD Head:  Normocephalic and atraumatic. Eyes:   No icterus.   Conjunctiva pink. PERRLA. Ears:  Normal auditory acuity. Neck:  Supple; no masses or thyroidomegaly Lungs:  Respirations even and unlabored. Lungs clear to auscultation bilaterally.   No wheezes, crackles, or rhonchi.  Heart:  Regular rate and rhythm;  Without murmur, clicks, rubs or gallops Abdomen:  Soft, nondistended, nontender. Normal bowel sounds. No appreciable masses or hepatomegaly.  No rebound or guarding.  Rectal:  Not performed. Msk:  Symmetrical without gross deformities.  Extremities:  Without edema, cyanosis or clubbing. Neurologic:  Alert and oriented x3;  grossly normal neurologically. Skin:  Intact without significant lesions or rashes. Cervical Nodes:  No significant cervical adenopathy. Psych:  Alert and cooperative. Normal affect.  LAB RESULTS: Recent Labs    11/01/23 2228  WBC 13.1*  HGB 6.6*  HCT 22.1*  PLT 365   BMET Recent Labs    11/01/23 2228  NA 137  K 3.7  CL 102  CO2 26  GLUCOSE 171*  BUN 19  CREATININE 1.69*  CALCIUM 8.2*   LFT No results for input(s): PROT, ALBUMIN, AST, ALT, ALKPHOS, BILITOT, BILIDIR, IBILI in the last 72 hours. PT/INR Recent Labs    11/02/23 0009  LABPROT 14.2  INR 1.0    STUDIES: DG Chest 2 View Result Date: 11/01/2023 CLINICAL DATA:  Chest pain for several days EXAM: CHEST - 2 VIEW COMPARISON:  10/25/2023 FINDINGS: Check shadow is stable. Lungs are clear bilaterally. No focal infiltrate or effusion is seen. No bony abnormality is noted. IMPRESSION: No active cardiopulmonary disease. Electronically Signed   By: Oneil Devonshire M.D.   On: 11/01/2023 22:44      Impression / Plan:   Assessment: Principal Problem:   GI bleed Active Problems:   Essential hypertension   Type 2 diabetes mellitus with chronic kidney disease, without long-term current use of insulin  (HCC)   Mitchell Robbins is a 68 y.o. y/o male with a history of a large cratered duodenal ulcer that was treated with multiple modalities including injection with epinephrine , cautery, hemoclips and hemostatic gel with what appeared to be resolution  of his bleeding.  The patient has had 1 small dark bowel movement since discharge prior to taking a laxative because he could not move his bowels and then he had a larger dark bowel movement but states that it was nowhere near as dark as when he had come in previously for the duodenal ulcer.  Patient has received 2 units of packed red blood cells.  Plan:  The patient will be set up for an EGD for today.  He appears to have lost a unit of blood since being discharged and has been transfused overnight.  Although the ulcer was quite big and not amenable to any further intervention endoscopically when he was discharged there may be something else going on therefore he will undergo the EGD today.  The patient has been explained the plan and agrees with the  Thank you for involving me in the care of this patient.      LOS: 0 days   Mitchell Copping, MD, MD. NOLIA 11/02/2023, 7:25 AM,  Pager [phone] 7am-5pm  Check AMION for 5pm -7am coverage and on weekends   Note: This dictation was prepared with Dragon dictation along with smaller phrase technology. Any transcriptional errors that result from this process are unintentional.

## 2023-11-03 LAB — HEMOGLOBIN AND HEMATOCRIT, BLOOD
HCT: 25.9 % — ABNORMAL LOW (ref 39.0–52.0)
Hemoglobin: 8 g/dL — ABNORMAL LOW (ref 13.0–17.0)

## 2023-11-03 LAB — CBC
HCT: 23.6 % — ABNORMAL LOW (ref 39.0–52.0)
Hemoglobin: 7.3 g/dL — ABNORMAL LOW (ref 13.0–17.0)
MCH: 26.1 pg (ref 26.0–34.0)
MCHC: 30.9 g/dL (ref 30.0–36.0)
MCV: 84.3 fL (ref 80.0–100.0)
Platelets: 362 K/uL (ref 150–400)
RBC: 2.8 MIL/uL — ABNORMAL LOW (ref 4.22–5.81)
RDW: 17.2 % — ABNORMAL HIGH (ref 11.5–15.5)
WBC: 11.6 K/uL — ABNORMAL HIGH (ref 4.0–10.5)
nRBC: 0 % (ref 0.0–0.2)

## 2023-11-03 LAB — BASIC METABOLIC PANEL WITH GFR
Anion gap: 9 (ref 5–15)
BUN: 18 mg/dL (ref 8–23)
CO2: 27 mmol/L (ref 22–32)
Calcium: 8.5 mg/dL — ABNORMAL LOW (ref 8.9–10.3)
Chloride: 101 mmol/L (ref 98–111)
Creatinine, Ser: 1.43 mg/dL — ABNORMAL HIGH (ref 0.61–1.24)
GFR, Estimated: 53 mL/min — ABNORMAL LOW (ref >60)
Glucose, Bld: 133 mg/dL — ABNORMAL HIGH (ref 70–99)
Potassium: 3.6 mmol/L (ref 3.5–5.1)
Sodium: 137 mmol/L (ref 135–145)

## 2023-11-03 LAB — PREPARE RBC (CROSSMATCH)

## 2023-11-03 MED ORDER — GUAIFENESIN 100 MG/5ML PO LIQD
5.0000 mL | ORAL | Status: DC | PRN
  Administered 2023-11-03: 5 mL via ORAL
  Filled 2023-11-03: qty 10

## 2023-11-03 MED ORDER — SENNOSIDES-DOCUSATE SODIUM 8.6-50 MG PO TABS
1.0000 | ORAL_TABLET | Freq: Two times a day (BID) | ORAL | Status: DC
  Administered 2023-11-03 – 2023-11-04 (×3): 1 via ORAL
  Filled 2023-11-03 (×3): qty 1

## 2023-11-03 MED ORDER — POLYETHYLENE GLYCOL 3350 17 G PO PACK
17.0000 g | PACK | Freq: Every day | ORAL | Status: DC
  Administered 2023-11-03 – 2023-11-04 (×2): 17 g via ORAL
  Filled 2023-11-03 (×2): qty 1

## 2023-11-03 MED ORDER — IRON SUCROSE 200 MG IVPB - SIMPLE MED
200.0000 mg | Freq: Once | Status: DC
  Filled 2023-11-03: qty 110

## 2023-11-03 MED ORDER — SODIUM CHLORIDE 0.9% IV SOLUTION
Freq: Once | INTRAVENOUS | Status: AC
  Administered 2023-11-03: 250 mL via INTRAVENOUS

## 2023-11-03 NOTE — Progress Notes (Signed)
  Progress Note    11/03/2023 9:41 AM 1 Day Post-Op  Subjective:  Mitchell Robbins is a 68 yo male who was recently discharged to the hospital with a large duodenal ulcer that was treated with epinephrine  and clips, cautery and hemostatic gel. The patient came back to the emergency room yesterday with chest pain and was noted to have a drop in his hemoglobin from 7.6-6.6.  Patient was taken back to the GI suite and underwent a repeat upper endoscopy.  Endoscopy was negative for any bleeding.  He had a nonbleeding duodenal ulcer which was treated back on 10/26/23.  Patient was sent back to the ward on a regular diet.  Patient's hemoglobin and hematocrit have remained stable at 7.3/23.6 today.   Vitals:   11/03/23 0444 11/03/23 0800  BP: 118/88 (!) 171/82  Pulse: 71 65  Resp: 20 17  Temp: 98.1 F (36.7 C) 97.7 F (36.5 C)  SpO2: 100% 99%   Physical Exam: Cardiac:  RRR, normal S1 and S2.  No murmurs appreciated. Lungs: Clear throughout on auscultation but diminished at the bases.  No rales rhonchi or wheezing noted. Incisions: None Extremities: Palpable pulses throughout, all extremities warm to touch. Abdomen: Positive bowel sounds throughout, soft, nontender nondistended. Neurologic: Alert and oriented x 3, answers all questions follows commands appropriately  CBC    Component Value Date/Time   WBC 11.6 (H) 11/03/2023 0534   RBC 2.80 (L) 11/03/2023 0534   HGB 7.3 (L) 11/03/2023 0534   HCT 23.6 (L) 11/03/2023 0534   PLT 362 11/03/2023 0534   MCV 84.3 11/03/2023 0534   MCH 26.1 11/03/2023 0534   MCHC 30.9 11/03/2023 0534   RDW 17.2 (H) 11/03/2023 0534   LYMPHSABS 4.1 (H) 07/04/2022 0149   MONOABS 0.5 07/04/2022 0149   EOSABS 0.2 07/04/2022 0149   BASOSABS 0.1 07/04/2022 0149    BMET    Component Value Date/Time   NA 137 11/03/2023 0534   K 3.6 11/03/2023 0534   CL 101 11/03/2023 0534   CO2 27 11/03/2023 0534   GLUCOSE 133 (H) 11/03/2023 0534   BUN 18 11/03/2023 0534    CREATININE 1.43 (H) 11/03/2023 0534   CALCIUM 8.5 (L) 11/03/2023 0534   GFRNONAA 53 (L) 11/03/2023 0534   GFRAA >60 10/30/2019 0654    INR    Component Value Date/Time   INR 1.0 11/02/2023 0009     Intake/Output Summary (Last 24 hours) at 11/03/2023 0941 Last data filed at 11/03/2023 0339 Gross per 24 hour  Intake 720 ml  Output --  Net 720 ml     Assessment/Plan:  68 y.o. male is s/p repeat upper endoscopy.  1 Day Post-Op   Plan Vascular surgery was following the patient for possible embolization of a duodenal ulcer that was previously found on upper endoscopy and treated on 10/26/2023.  Patient underwent repeat upper endoscopy yesterday on 11/02/2023.  Duodenal ulcer still present but not bleeding at this time.  Patient's hemoglobin hematocrit have remained stable this morning.  Vascular surgery does not recommend any intervention or embolization at this time.  Recommend continue monitoring hemoglobin and hematocrit for stabilization. Vascular surgery will sign off at this time.  Please reconsult again if needed for continued duodenal ulcer bleeding.   DVT prophylaxis: On hold due to GI bleeding   Gwendlyn JONELLE Shank [facility] 11/03/2023 9:41 AM

## 2023-11-03 NOTE — Plan of Care (Signed)

## 2023-11-03 NOTE — Progress Notes (Signed)
 Ruel Kung , MD [address], [place], [place], [address] Phone: [phone] Fax: [phone]   [name] is being followed for GI bleed   Subjective: Says that the not bright red in color but darker in color he feels he is doing better.   Objective: Vital signs in last 24 hours: Vitals:   11/03/23 0444 11/03/23 0800 11/03/23 1130 11/03/23 1150  BP: 118/88 (!) 171/82 121/79 123/70  Pulse: 71 65 76 76  Resp: 20 17    Temp: 98.1 F (36.7 C) 97.7 F (36.5 C) 98.2 F (36.8 C) 98.1 F (36.7 C)  TempSrc:  Oral Oral Oral  SpO2: 100% 99% 100% 98%  Weight:      Height:       Weight change:   Intake/Output Summary (Last 24 hours) at 11/03/2023 1405 Last data filed at 11/03/2023 0900 Gross per 24 hour  Intake 960 ml  Output --  Net 960 ml     Exam:  Abdomen: soft, nontender, normal bowel sounds   Lab Results: @LABTEST2 @ Micro Results: No results found for this or any previous visit (from the past 240 hours). Studies/Results: DG Chest 2 View Result Date: 11/01/2023 CLINICAL DATA:  Chest pain for several days EXAM: CHEST - 2 VIEW COMPARISON:  10/25/2023 FINDINGS: Check shadow is stable. Lungs are clear bilaterally. No focal infiltrate or effusion is seen. No bony abnormality is noted. IMPRESSION: No active cardiopulmonary disease. Electronically Signed   By: Oneil Devonshire M.D.   On: 11/01/2023 22:44   Medications: I have reviewed the patient's current medications. Scheduled Meds:  cloNIDine   0.3 mg Oral TID   doxazosin   8 mg Oral QHS   glipiZIDE   10 mg Oral Daily   hydrALAZINE   100 mg Oral TID   isosorbide  mononitrate  60 mg Oral Daily   labetalol   200 mg Oral BID   pantoprazole  (PROTONIX ) IV  40 mg Intravenous Q12H   polyethylene glycol  17 g Oral Daily   senna-docusate  1 tablet Oral BID   topiramate   50 mg Oral BID   Continuous Infusions: PRN Meds:.acetaminophen  **OR** acetaminophen , guaiFENesin , ondansetron  **OR** ondansetron  (ZOFRAN ) IV,  traZODone      Latest Ref Rng & Units 11/03/2023    5:34 AM 11/02/2023    9:01 PM 11/02/2023    1:24 PM  CBC  WBC 4.0 - 10.5 K/uL 11.6     Hemoglobin 13.0 - 17.0 g/dL 7.3  7.2  7.5   Hematocrit 39.0 - 52.0 % 23.6  23.1  24.4   Platelets 150 - 400 K/uL 362       Assessment: Principal Problem:   Upper GI bleed Active Problems:   Essential hypertension   Type 2 diabetes mellitus with chronic kidney disease, without long-term current use of insulin  (HCC)   Mitchell Robbins 68 y.o. male with a prior history of a duodenal ulcer treated endoscopically on 10/26/2023.  Repeat upper endoscopy performed yesterday showed that the ulcer was still present but no bleeding noted.  Hemoglobin has been stable Plan: 1.  Monitor CBC transfuse as needed 2.  Recommend outpatient high-dose PPI for 8 weeks and then reduce to once a day Prilosec 40 mg twice daily for 8 weeks then reduce to 40 mg once a day and further therapy can be discussed he follows up with in the office.  He will require H. pylori to be checked as an outpatient.  Seen by vascular surgery send stable no further input presently.  Based  on his history it appears that he is not actively bleeding and the dark stools probably related to old blood.  I will sign off.  Please call me if any further GI concerns or questions.  We would like to thank you for the opportunity to participate in the care of Mitchell Robbins.   LOS: 0 days   Ruel Kung, MD 11/03/2023, 2:05 PM

## 2023-11-03 NOTE — Progress Notes (Signed)
  Progress Note   Patient: Mitchell Robbins FMW:[id number] DOB: 01/22/1956 DOA: 11/01/2023     0 DOS: the patient was seen and examined on 11/03/2023   Brief hospital course: Patient admitted with recurrent anemia and melena, requiring blood transfusions, in the setting of recent admission for same, found to have a bleeding duodenal ulcer on EGD prior admission.  This admission, repeat EGD showed the ulcer not bleeding.  Hbg has remained low 7's since admission despite initial blood transfusions.  8/26 -- repeat blood transfusion today, possible d/c this evening or tomorrow AM if Hbg improved and stable and no recurrent signs of bleeding.   Assessment and Plan:  * Recent Upper GI bleed Symptomatic anemia -- acute anemia related to recent bleeding ulcer that was seen on EGD and treated during recent admission. 8/26 -- dark BM reported, suspected to be old blood not from active bleeding --Transfuse RBC's today, Hbg still 7.2, pt remaining symptomatic for anemia --Post-RBC Hbg level --AM CBC if not discharged today --GI and Vascular have signed off --Continue PPI -- see GI recs for PPI taper --H pylori evaluation as outpatient  Type 2 diabetes mellitus with chronic kidney disease, without long-term current use of insulin  (HCC) - The patient will be placed on supplemental coverage with NovoLog . - We will hold off metformin. - Will continue Glucotrol  XL.   Essential hypertension - Will continue antihypertensive therapy while holding off diuretics.      Subjective: Pt seen up in recliner today.  He rpeorts having a BM that was dark, no red blood or maroon.  No abdominal pain, N/V or other acute copmlaints.  Only notes ongoing fatigue and generalized weakness.  Physical Exam: Vitals:   11/03/23 0444 11/03/23 0800 11/03/23 1130 11/03/23 1150  BP: 118/88 (!) 171/82 121/79 123/70  Pulse: 71 65 76 76  Resp: 20 17    Temp: 98.1 F (36.7 C) 97.7 F (36.5 C) 98.2 F (36.8 C) 98.1 F (36.7  C)  TempSrc:  Oral Oral Oral  SpO2: 100% 99% 100% 98%  Weight:      Height:        General exam: awake, alert, no acute distress HEENT: moist mucus membranes, hearing grossly normal  Respiratory system: CTAB diminished throughout, no wheezes, rales or rhonchi, normal respiratory effort. Cardiovascular system: normal S1/S2, RRR Gastrointestinal system: distended/protuberant abdomen, non-tender, +BS's Central nervous system: A&O x 3. no gross focal neurologic deficits, normal speech Extremities: moves all , no edema, normal tone Skin: dry, intact, normal temperature Psychiatry: normal mood, congruent affect, judgement and insight appear normal   Data Reviewed:  Notable labs -- Hbg trend 7.5 >> 7.2 >> 7.3, WBC 11.6 improving, Cr 1.43 stable  Family Communication: None present. Pt updated in detail.  Disposition: Status is: Observation Pt remains admitted due to symptomatic anemia requiring blood transfusion today.   Planned Discharge Destination: Home    Time spent: 40 minutes  Author: Burnard DELENA Cunning, DO 11/03/2023 2:20 PM  For on call review [email or web address].

## 2023-11-04 LAB — TYPE AND SCREEN
ABO/RH(D): A POS
Antibody Screen: NEGATIVE
Unit division: 0
Unit division: 0
Unit division: 0

## 2023-11-04 LAB — BPAM RBC
Blood Product Expiration Date: 202508272359
Blood Product Expiration Date: 202508282359
Blood Product Expiration Date: 202509132359
ISSUE DATE / TIME: 202508250209
ISSUE DATE / TIME: 202508250446
ISSUE DATE / TIME: 202508261124
Unit Type and Rh: 600
Unit Type and Rh: 600
Unit Type and Rh: 6200

## 2023-11-04 LAB — CBC
HCT: 25.8 % — ABNORMAL LOW (ref 39.0–52.0)
Hemoglobin: 8 g/dL — ABNORMAL LOW (ref 13.0–17.0)
MCH: 25.9 pg — ABNORMAL LOW (ref 26.0–34.0)
MCHC: 31 g/dL (ref 30.0–36.0)
MCV: 83.5 fL (ref 80.0–100.0)
Platelets: 368 K/uL (ref 150–400)
RBC: 3.09 MIL/uL — ABNORMAL LOW (ref 4.22–5.81)
RDW: 16.9 % — ABNORMAL HIGH (ref 11.5–15.5)
WBC: 11.7 K/uL — ABNORMAL HIGH (ref 4.0–10.5)
nRBC: 0.2 % (ref 0.0–0.2)

## 2023-11-04 MED ORDER — POLYETHYLENE GLYCOL 3350 17 G PO PACK: 17.0000 g | PACK | Freq: Every day | ORAL | 0 refills | Status: AC

## 2023-11-04 MED ORDER — SENNOSIDES-DOCUSATE SODIUM 8.6-50 MG PO TABS: 1.0000 | ORAL_TABLET | Freq: Two times a day (BID) | ORAL | 0 refills | Status: AC

## 2023-11-04 NOTE — Plan of Care (Signed)

## 2023-11-04 NOTE — TOC CM/SW Note (Signed)
 Transition of Care [facility]) - Inpatient Brief Assessment   Patient Details  Name: Mitchell Robbins MRN: [id number] Date of Birth: 1955/06/27  Transition of Care [facility]) CM/SW Contact:    Lauraine JAYSON Carpen, LCSW Phone Number: 11/04/2023, 11:42 AM   Clinical Narrative: Patient has orders to discharge home today. Chart reviewed. No TOC needs identified. CSW signing off.  Transition of Care Asessment: Insurance and Status: Selfpay Patient has primary care physician: Yes Home environment has been reviewed: Single family home Prior level of function:: Not documented Prior/Current Home Services: No current home services Social Drivers of Health Review: SDOH reviewed no interventions necessary Readmission risk has been reviewed: Yes Transition of care needs: no transition of care needs at this time

## 2023-11-04 NOTE — Discharge Summary (Signed)
 Physician Discharge Summary   Patient: Mitchell Robbins MRN: [id number] DOB: Mar 28, 1955  Admit date:     11/01/2023  Discharge date: 11/04/23  Discharge Physician: Drue ONEIDA Potter   PCP: Auston Reyes BIRCH, MD   Recommendations at discharge:  Follow-up with gastroenterology as outpatient  Discharge Diagnoses: Principal Problem:   Upper GI bleed Active Problems:   Essential hypertension   Type 2 diabetes mellitus with chronic kidney disease, without long-term current use of insulin  (HCC)  Resolved Problems:   * No resolved hospital problems. [facility] Course: Patient admitted with recurrent anemia and melena, requiring blood transfusions, in the setting of recent admission for same, found to have a bleeding duodenal ulcer on EGD prior admission.  This admission, repeat EGD showed the ulcer not bleeding.  Patient required blood transfusion.  Was seen by gastroenterologist and hemoglobin have remained stable.  Patient will follow-up with GI as an outpatient.   Consultants: Enterologist, vascular surgery Procedures performed: None Disposition: Home Diet recommendation:  Cardiac diet DISCHARGE MEDICATION: Allergies as of 11/04/2023   No Known Allergies      Medication List     TAKE these medications    acetaminophen  650 MG CR tablet Commonly known as: TYLENOL  Take 650 mg by mouth every 8 (eight) hours if needed for mild pain Do not crush, chew, or split.   cloNIDine  0.3 MG tablet Commonly known as: CATAPRES  Take 0.3 mg by mouth 3 (three) times daily.   doxazosin  4 MG tablet Commonly known as: CARDURA  Take 8 mg by mouth at bedtime.   glipiZIDE  10 MG 24 hr tablet Commonly known as: GLUCOTROL  XL Take 10 mg by mouth daily.   hydrALAZINE  100 MG tablet Commonly known as: APRESOLINE  Take 100 mg by mouth 3 (three) times daily.   hydrochlorothiazide  25 MG tablet Commonly known as: HYDRODIURIL  Take 25 mg by mouth daily.   isosorbide  mononitrate 60 MG 24 hr tablet Commonly  known as: IMDUR  Take 1 tablet by mouth daily.   labetalol  200 MG tablet Commonly known as: NORMODYNE  Take 200 mg by mouth 2 (two) times daily.   metFORMIN 500 MG tablet Commonly known as: GLUCOPHAGE Take 1,000 mg by mouth daily with supper.   pantoprazole  40 MG tablet Commonly known as: Protonix  Take 1 tablet (40 mg total) by mouth 2 (two) times daily before a meal.   polyethylene glycol 17 g packet Commonly known as: MIRALAX  / GLYCOLAX  Take 17 g by mouth daily.   senna-docusate 8.6-50 MG tablet Commonly known as: Senokot-S Take 1 tablet by mouth 2 (two) times daily.   topiramate  50 MG tablet Commonly known as: TOPAMAX  Take 50 mg by mouth 2 (two) times daily.        Discharge Exam: Filed Weights   11/01/23 2231  Weight: 131.5 kg   General exam: awake, alert, no acute distress HEENT: moist mucus membranes, hearing grossly normal  Respiratory system: CTAB diminished throughout, no wheezes, rales or rhonchi, normal respiratory effort. Cardiovascular system: normal S1/S2, RRR Gastrointestinal system: distended/protuberant abdomen, non-tender, +BS's Central nervous system: A&O x 3. no gross focal neurologic deficits, normal speech Extremities: moves all , no edema, normal tone Skin: dry, intact, normal temperature Psychiatry: normal mood, congruent affect, judgement and insight appear normal    Condition at discharge: good  The results of significant diagnostics from this hospitalization (including imaging, microbiology, ancillary and laboratory) are listed below for reference.   Imaging Studies: DG Chest 2 View Result Date: 11/01/2023 CLINICAL DATA:  Chest pain for  several days EXAM: CHEST - 2 VIEW COMPARISON:  10/25/2023 FINDINGS: Check shadow is stable. Lungs are clear bilaterally. No focal infiltrate or effusion is seen. No bony abnormality is noted. IMPRESSION: No active cardiopulmonary disease. Electronically Signed   By: Oneil Devonshire M.D.   On: 11/01/2023 22:44    DG Chest 2 View Result Date: 10/25/2023 CLINICAL DATA:  Short of breath EXAM: CHEST - 2 VIEW COMPARISON:  11/16/2022 FINDINGS: Normal mediastinum and cardiac silhouette. Normal pulmonary vasculature. No evidence of effusion, infiltrate, or pneumothorax. No acute bony abnormality. IMPRESSION: No active cardiopulmonary disease. Electronically Signed   By: Jackquline Boxer M.D.   On: 10/25/2023 15:29   US  Venous Img Lower Unilateral Left Result Date: 10/14/2023 CLINICAL DATA:  Left leg pain EXAM: Left LOWER EXTREMITY VENOUS DOPPLER ULTRASOUND TECHNIQUE: Gray-scale sonography with compression, as well as color and duplex ultrasound, were performed to evaluate the deep venous system(s) from the level of the common femoral vein through the popliteal and proximal calf veins. COMPARISON:  07/04/2022 FINDINGS: VENOUS Normal compressibility of the common femoral, superficial femoral, and popliteal veins, as well as the visualized calf veins. Visualized portions of profunda femoral vein and great saphenous vein unremarkable. No filling defects to suggest DVT on grayscale or color Doppler imaging. Doppler waveforms show normal direction of venous flow, normal respiratory plasticity and response to augmentation. Limited views of the contralateral common femoral vein are unremarkable. OTHER None. Limitations: none IMPRESSION: Negative. Electronically Signed   By: Luke Bun M.D.   On: 10/14/2023 02:47   DG Femur Min 2 Views Left Result Date: 10/13/2023 CLINICAL DATA:  [id number]. Pain in the left thigh times several months, recently worsening. No known injury. EXAM: LEFT FEMUR 2 VIEWS; PELVIS - 1-2 VIEW COMPARISON:  Abdomen film 12/31/2022, left femoral MRI 07/04/2022. FINDINGS: AP pelvis 1 view: There is no evidence of fracture or other focal bone lesions. Soft tissues are unremarkable. There are enthesopathic changes of the bony pelvis. 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 Advanced degenerative  change lower lumbar spine. The hip joints are unremarkable. AP Lat two views of the right femur obtained in 4 films: There is no evidence of fracture or other focal bone lesions. The hip joint and the knee joint are unremarkable. There is a small ossicle adjacent the upper medial aspect of the medial femoral condyle which could be due to a dystrophic calcification such as a remote MCL injury. Also noted is patchy calcification in the superficial femoral artery. Soft tissues are otherwise unremarkable. IMPRESSION: 1. No evidence of fracture or other focal bone lesions in the pelvis or left femur. 2. 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 3. Advanced degenerative change lower lumbar spine. 4. Small ossicle adjacent the upper medial aspect of the medial femoral condyle which could be due to a dystrophic calcification such as a remote MCL injury. Electronically Signed   By: Francis Quam M.D.   On: 10/13/2023 23:55   DG Pelvis 1-2 Views Result Date: 10/13/2023 CLINICAL DATA:  [id number]. Pain in the left thigh times several months, recently worsening. No known injury. EXAM: LEFT FEMUR 2 VIEWS; PELVIS - 1-2 VIEW COMPARISON:  Abdomen film 12/31/2022, left femoral MRI 07/04/2022. FINDINGS: AP pelvis 1 view: There is no evidence of fracture or other focal bone lesions. Soft tissues are unremarkable. There are enthesopathic changes of the bony pelvis. 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 Advanced degenerative change lower lumbar spine.  The hip joints are unremarkable. AP Lat two views of the right femur obtained in 4 films: There is no evidence of fracture or other focal bone lesions. The hip joint and the knee joint are unremarkable. There is a small ossicle adjacent the upper medial aspect of the medial femoral condyle which could be due to a dystrophic calcification such as a remote MCL injury. Also noted is patchy calcification in the superficial femoral artery. Soft tissues are  otherwise unremarkable. IMPRESSION: 1. No evidence of fracture or other focal bone lesions in the pelvis or left femur. 2. 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 3. Advanced degenerative change lower lumbar spine. 4. Small ossicle adjacent the upper medial aspect of the medial femoral condyle which could be due to a dystrophic calcification such as a remote MCL injury. Electronically Signed   By: Francis Quam M.D.   On: 10/13/2023 23:55    Microbiology: Results for orders placed or performed during the hospital encounter of 11/16/22  Resp panel by RT-PCR (RSV, Flu A&B, Covid) Anterior Nasal Swab     Status: None   Collection Time: 11/17/22 12:04 AM   Specimen: Anterior Nasal Swab  Result Value Ref Range Status   SARS Coronavirus 2 by RT PCR NEGATIVE NEGATIVE Final    Comment: (NOTE) SARS-CoV-2 target nucleic acids are NOT DETECTED.  The SARS-CoV-2 RNA is generally detectable in upper respiratory specimens during the acute phase of infection. The lowest concentration of SARS-CoV-2 viral copies this assay can detect is 138 copies/mL. A negative result does not preclude SARS-Cov-2 infection and should not be used as the sole basis for treatment or other patient management decisions. A negative result may occur with  improper specimen collection/handling, submission of specimen other than nasopharyngeal swab, presence of viral mutation(s) within the areas targeted by this assay, and inadequate number of viral copies(<138 copies/mL). A negative result must be combined with clinical observations, patient history, and epidemiological information. The expected result is Negative.  Fact Sheet for Patients:  BloggerCourse.com  Fact Sheet for Healthcare Providers:  SeriousBroker.it  This test is no t yet approved or cleared by the [place]  FDA and  has been authorized for detection and/or diagnosis of SARS-CoV-2 by FDA under  an Emergency Use Authorization (EUA). This EUA will remain  in effect (meaning this test can be used) for the duration of the COVID-19 declaration under Section 564(b)(1) of the Act, 21 U.S.C.section 360bbb-3(b)(1), unless the authorization is terminated  or revoked sooner.       Influenza A by PCR NEGATIVE NEGATIVE Final   Influenza B by PCR NEGATIVE NEGATIVE Final    Comment: (NOTE) The Xpert Xpress SARS-CoV-2/FLU/RSV plus assay is intended as an aid in the diagnosis of influenza from Nasopharyngeal swab specimens and should not be used as a sole basis for treatment. Nasal washings and aspirates are unacceptable for Xpert Xpress SARS-CoV-2/FLU/RSV testing.  Fact Sheet for Patients: BloggerCourse.com  Fact Sheet for Healthcare Providers: SeriousBroker.it  This test is not yet approved or cleared by the [place]  FDA and has been authorized for detection and/or diagnosis of SARS-CoV-2 by FDA under an Emergency Use Authorization (EUA). This EUA will remain in effect (meaning this test can be used) for the duration of the COVID-19 declaration under Section 564(b)(1) of the Act, 21 U.S.C. section 360bbb-3(b)(1), unless the authorization is terminated or revoked.     Resp Syncytial Virus by PCR NEGATIVE NEGATIVE Final    Comment: (NOTE) Fact Sheet for  Patients: BloggerCourse.com  Fact Sheet for Healthcare Providers: SeriousBroker.it  This test is not yet approved or cleared by the [place]  FDA and has been authorized for detection and/or diagnosis of SARS-CoV-2 by FDA under an Emergency Use Authorization (EUA). This EUA will remain in effect (meaning this test can be used) for the duration of the COVID-19 declaration under Section 564(b)(1) of the Act, 21 U.S.C. section 360bbb-3(b)(1), unless the authorization is terminated or revoked.  Performed at [facility], [address] [place], [place] [address]     Labs: CBC: Recent Labs  Lab 11/01/23 2228 11/02/23 0746 11/02/23 1324 11/02/23 2101 11/03/23 0534 11/03/23 1526 11/04/23 0550  WBC 13.1* 12.3*  --   --  11.6*  --  11.7*  HGB 6.6* 7.5* 7.5* 7.2* 7.3* 8.0* 8.0*  HCT 22.1* 24.7* 24.4* 23.1* 23.6* 25.9* 25.8*  MCV 85.7 84.3  --   --  84.3  --  83.5  PLT 365 350  --   --  362  --  368   Basic Metabolic Panel: Recent Labs  Lab 11/01/23 2228 11/02/23 0009 11/02/23 0746 11/03/23 0534  NA 137  --  139 137  K 3.7  --  3.7 3.6  CL 102  --  102 101  CO2 26  --  26 27  GLUCOSE 171*  --  113* 133*  BUN 19  --  16 18  CREATININE 1.69*  --  1.49* 1.43*  CALCIUM 8.2*  --  8.4* 8.5*  MG  --  2.4  --   --    Liver Function Tests: No results for input(s): AST, ALT, ALKPHOS, BILITOT, PROT, ALBUMIN in the last 168 hours. CBG: Recent Labs  Lab 11/02/23 1034  GLUCAP 119*    Discharge time spent:  34 minutes.  Signed: Drue ONEIDA Potter, MD [facility] 11/04/2023

## 2024-02-28 ENCOUNTER — Other Ambulatory Visit: Payer: Self-pay

## 2024-02-28 ENCOUNTER — Encounter: Payer: Self-pay | Admitting: Emergency Medicine

## 2024-02-28 ENCOUNTER — Emergency Department: Payer: Self-pay

## 2024-02-28 ENCOUNTER — Emergency Department
Admission: EM | Admit: 2024-02-28 | Discharge: 2024-02-28 | Disposition: A | Discharge status: Home / Self Care | Payer: Self-pay | Attending: Emergency Medicine | Admitting: Emergency Medicine

## 2024-02-28 DIAGNOSIS — Z85038 Personal history of other malignant neoplasm of large intestine: Secondary | ICD-10-CM | POA: Insufficient documentation

## 2024-02-28 DIAGNOSIS — M79642 Pain in left hand: Secondary | ICD-10-CM | POA: Insufficient documentation

## 2024-02-28 DIAGNOSIS — I129 Hypertensive chronic kidney disease with stage 1 through stage 4 chronic kidney disease, or unspecified chronic kidney disease: Secondary | ICD-10-CM | POA: Insufficient documentation

## 2024-02-28 DIAGNOSIS — N1831 Chronic kidney disease, stage 3a: Secondary | ICD-10-CM | POA: Insufficient documentation

## 2024-02-28 DIAGNOSIS — E1122 Type 2 diabetes mellitus with diabetic chronic kidney disease: Secondary | ICD-10-CM | POA: Insufficient documentation

## 2024-02-28 DIAGNOSIS — F17200 Nicotine dependence, unspecified, uncomplicated: Secondary | ICD-10-CM | POA: Insufficient documentation

## 2024-02-28 DIAGNOSIS — M7989 Other specified soft tissue disorders: Secondary | ICD-10-CM | POA: Insufficient documentation

## 2024-02-28 LAB — CBC WITH DIFFERENTIAL/PLATELET
Abs Immature Granulocytes: 0.05 K/uL (ref 0.00–0.07)
Basophils Absolute: 0.1 K/uL (ref 0.0–0.1)
Basophils Relative: 1 %
Eosinophils Absolute: 0.1 K/uL (ref 0.0–0.5)
Eosinophils Relative: 1 %
HCT: 34.4 % — ABNORMAL LOW (ref 39.0–52.0)
Hemoglobin: 9.5 g/dL — ABNORMAL LOW (ref 13.0–17.0)
Immature Granulocytes: 0 %
Lymphocytes Relative: 23 %
Lymphs Abs: 2.9 K/uL (ref 0.7–4.0)
MCH: 19.9 pg — ABNORMAL LOW (ref 26.0–34.0)
MCHC: 27.6 g/dL — ABNORMAL LOW (ref 30.0–36.0)
MCV: 72 fL — ABNORMAL LOW (ref 80.0–100.0)
Monocytes Absolute: 1 K/uL (ref 0.1–1.0)
Monocytes Relative: 8 %
Neutro Abs: 8.8 K/uL — ABNORMAL HIGH (ref 1.7–7.7)
Neutrophils Relative %: 67 %
Platelets: 415 K/uL — ABNORMAL HIGH (ref 150–400)
RBC: 4.78 MIL/uL (ref 4.22–5.81)
RDW: 21.7 % — ABNORMAL HIGH (ref 11.5–15.5)
Smear Review: NORMAL
WBC: 12.9 K/uL — ABNORMAL HIGH (ref 4.0–10.5)
nRBC: 0 % (ref 0.0–0.2)

## 2024-02-28 LAB — BASIC METABOLIC PANEL WITH GFR
Anion gap: 10 (ref 5–15)
BUN: 15 mg/dL (ref 8–23)
CO2: 27 mmol/L (ref 22–32)
Calcium: 9 mg/dL (ref 8.9–10.3)
Chloride: 102 mmol/L (ref 98–111)
Creatinine, Ser: 1.19 mg/dL (ref 0.61–1.24)
GFR, Estimated: >60 mL/min
Glucose, Bld: 103 mg/dL — ABNORMAL HIGH (ref 70–99)
Potassium: 3.4 mmol/L — ABNORMAL LOW (ref 3.5–5.1)
Sodium: 139 mmol/L (ref 135–145)

## 2024-02-28 LAB — URIC ACID: Uric Acid, Serum: 8.7 mg/dL — ABNORMAL HIGH (ref 3.7–8.6)

## 2024-02-28 LAB — C-REACTIVE PROTEIN: CRP: <3 mg/dL — ABNORMAL HIGH

## 2024-02-28 LAB — SEDIMENTATION RATE: Sed Rate: 21 mm/h — ABNORMAL HIGH (ref 0–16)

## 2024-02-28 MED ORDER — DEXAMETHASONE SOD PHOSPHATE PF 10 MG/ML IJ SOLN
10.0000 mg | Freq: Once | INTRAMUSCULAR | Status: AC
  Administered 2024-02-28: 10 mg via INTRAVENOUS

## 2024-02-28 MED ORDER — INDOMETHACIN 50 MG PO CAPS: 50.0000 mg | ORAL_CAPSULE | Freq: Three times a day (TID) | ORAL | 0 refills | Status: AC

## 2024-02-28 MED ORDER — KETOROLAC TROMETHAMINE 15 MG/ML IJ SOLN
15.0000 mg | Freq: Once | INTRAMUSCULAR | Status: AC
  Administered 2024-02-28: 15 mg via INTRAVENOUS
  Filled 2024-02-28: qty 1

## 2024-02-28 NOTE — ED Provider Notes (Signed)
 "  [facility] Provider Note    Event Date/Time   First MD Initiated Contact with Patient 02/28/24 [phone]     (approximate)   History   Hand Pain (L)   HPI  Mitchell Robbins is a 68 y.o. male with a past medical history of type 2 diabetes, CKD, hypertension, reported history of gout who presents today for evaluation of left wrist swelling.  Patient reports that this has happened to him before several times and he has also been diagnosed with gout.  He also reports that has not had any fevers or chills.  Denies any injuries or trauma.  Denies any penetrating wounds.  Patient Active Problem List   Diagnosis Date Noted   Essential hypertension 11/02/2023   Type 2 diabetes mellitus with chronic kidney disease, without long-term current use of insulin  (HCC) 11/02/2023   Duodenal ulcer 10/27/2023   Anemia 10/27/2023   Bleeding duodenal ulcer 10/26/2023   Upper GI bleed 10/25/2023   CKD stage 3a, GFR 45-59 ml/min (HCC) 10/25/2023   OSA (obstructive sleep apnea) 07/21/2022   Mass of left thigh 07/07/2022   Cough in adult 07/07/2022   Morbid obesity with BMI of 40.0-44.9, adult (HCC) 01/06/2022   Tobacco abuse 06/10/2021   A-fib (HCC) 03/18/2020   Type II diabetes mellitus with complication (HCC) 12/12/2019   History of colonic polyps    Polyp of colon    Special screening for malignant neoplasms, colon    Polyp of sigmoid colon    Benign neoplasm of transverse colon    Benign neoplasm of cecum    Personal history of peptic ulcer disease    PUD (peptic ulcer disease)    Polyp of duodenum    Duodenal ulceration    Acute gastritis without hemorrhage    Melena 12/23/2017   Cellulitis 10/01/2015   Hypertension 01/29/2012          Physical Exam   Triage Vital Signs: ED Triage Vitals  Encounter Vitals Group     BP 02/28/24 0429 (!) 189/98     Girls Systolic BP Percentile --      Girls Diastolic BP Percentile --      Boys Systolic BP Percentile --       Boys Diastolic BP Percentile --      Pulse Rate 02/28/24 0429 88     Resp 02/28/24 0429 16     Temp 02/28/24 0429 (!) 97.3 F (36.3 C)     Temp Source 02/28/24 0429 Oral     SpO2 02/28/24 0429 95 %     Weight 02/28/24 0655 289 lb 14.5 oz (131.5 kg)     Height 02/28/24 0655 5' 10 (1.778 m)     Head Circumference --      Peak Flow --      Pain Score 02/28/24 0429 10     Pain Loc --      Pain Education --      Exclude from Growth Chart --     Most recent vital signs: Vitals:   02/28/24 0700 02/28/24 0915  BP: (!) 184/94 (!) 170/80  Pulse: 87 80  Resp: 18 18  Temp: 97.8 F (36.6 C) 98 F (36.7 C)  SpO2: 95% 95%    Physical Exam Vitals and nursing note reviewed.  Constitutional:      General: Awake and alert. No acute distress.    Appearance: Normal appearance. The patient is normal weight.  HENT:     Head: Normocephalic and  atraumatic.     Mouth: Mucous membranes are moist.  Eyes:     General: PERRL. Normal EOMs        Right eye: No discharge.        Left eye: No discharge.     Conjunctiva/sclera: Conjunctivae normal.  Cardiovascular:     Rate and Rhythm: Normal rate and regular rhythm.     Pulses: Normal pulses.  Pulmonary:     Effort: Pulmonary effort is normal. No respiratory distress.     Breath sounds: Normal breath sounds.  Abdominal:     Abdomen is soft. There is no abdominal tenderness. No rebound or guarding. No distention. Musculoskeletal:        General: No swelling. Normal range of motion.     Cervical back: Normal range of motion and neck supple.  Left hand: Dorsal soft tissue swelling without erythema or warmth.  No wounds noted.  No palmar tenderness.  No pain with axial loading.  Able to supinate pronate at the wrist. Skin:    General: Skin is warm and dry.     Capillary Refill: Capillary refill takes less than 2 seconds.     Findings: No rash.  Neurological:     Mental Status: The patient is awake and alert.      ED Results / Procedures /  Treatments   Labs (all labs ordered are listed, but only abnormal results are displayed) Labs Reviewed  BASIC METABOLIC PANEL WITH GFR - Abnormal; Notable for the following components:      Result Value   Potassium 3.4 (*)    Glucose, Bld 103 (*)    All other components within normal limits  CBC WITH DIFFERENTIAL/PLATELET - Abnormal; Notable for the following components:   WBC 12.9 (*)    Hemoglobin 9.5 (*)    HCT 34.4 (*)    MCV 72.0 (*)    MCH 19.9 (*)    MCHC 27.6 (*)    RDW 21.7 (*)    Platelets 415 (*)    Neutro Abs 8.8 (*)    All other components within normal limits  SEDIMENTATION RATE - Abnormal; Notable for the following components:   Sed Rate 21 (*)    All other components within normal limits  URIC ACID - Abnormal; Notable for the following components:   Uric Acid, Serum 8.7 (*)    All other components within normal limits  C-REACTIVE PROTEIN - Abnormal; Notable for the following components:   CRP <3.0 (*)    All other components within normal limits     EKG     RADIOLOGY I independently reviewed and interpreted imaging and agree with radiologists findings.     PROCEDURES:  Critical Care performed:   Procedures   MEDICATIONS ORDERED IN ED: Medications  ketorolac  (TORADOL ) 15 MG/ML injection 15 mg (15 mg Intravenous Given 02/28/24 0913)  dexamethasone  (DECADRON ) injection 10 mg (10 mg Intravenous Given 02/28/24 0928)     IMPRESSION / MDM / ASSESSMENT AND PLAN / ED COURSE  I reviewed the triage vital signs and the nursing notes.   Differential diagnosis includes, but is not limited to, gout, cellulitis, less likely septic joint or flexor tenosynovitis.  I reviewed the patient's chart.  He was seen in 2024 with similar complaint at which time he had wrist pain and was treated with Augmentin .  Patient is awake and alert, hemodynamically stable and afebrile.  He has diffuse dorsal swelling to his hand without erythema or warmth.  He has no  palmar tenderness.  No tenderness along the flexor tendon sheath of his fingers.  He is able to range his hand and his wrist, able to supinate and pronate.  No pain with axial loading.  Exam is not consistent with septic joint or flexor tenosynovitis at this time.  There is no fusiform swelling of any finger.  Patient has had this multiple times in the past.  He was given a dose of Toradol  and Decadron  (after his sugar came back normal) with significant improvement of both his pain and his swelling.  Uric acid is mildly elevated.  CRP is negative.  He will be started on anti-inflammatories and I recommended close outpatient follow-up, and we discussed strict return precautions.  Patient understands and agrees with plan.  He was discharged in stable condition.  Patient was also seen by Dr. Floy who agrees with assessment and plan.  Patient's presentation is most consistent with acute complicated illness / injury requiring diagnostic workup.       FINAL CLINICAL IMPRESSION(S) / ED DIAGNOSES   Final diagnoses:  Left hand pain     Rx / DC Orders   ED Discharge Orders          Ordered    indomethacin  (INDOCIN ) 50 MG capsule  3 times daily with meals        02/28/24 0942             Note:  This document was prepared using Dragon voice recognition software and may include unintentional dictation errors.   Joshlyn Beadle E, PA-C 02/28/24 1145    Goodman, Graydon, MD 02/28/24 1432  "

## 2024-02-28 NOTE — Discharge Instructions (Signed)
 Please take the medication as prescribed.  Please return for any new, worsening, or changing symptoms or other concerns including fevers, chills, or any other concerns.  It was a pleasure caring for you today.

## 2024-02-28 NOTE — ED Triage Notes (Signed)
 Pt reports concern for left hand pain he first noticed yesterday. Increased today into left wrist. Denies injury. Sts this has happened before prescribed prednisone  with resolution of s/s. No redness.
